# Patient Record
Sex: Male | Born: 1935 | Race: White | Hispanic: No | Marital: Single | Smoking: Former smoker
Health system: Southern US, Community
[De-identification: ages and names within clinical notes are randomized; demographics above are authoritative.]

## PROBLEM LIST (undated history)

## (undated) DIAGNOSIS — G40909 Epilepsy, unspecified, not intractable, without status epilepticus: Secondary | ICD-10-CM

## (undated) DIAGNOSIS — J449 Chronic obstructive pulmonary disease, unspecified: Secondary | ICD-10-CM

## (undated) DIAGNOSIS — E785 Hyperlipidemia, unspecified: Secondary | ICD-10-CM

## (undated) DIAGNOSIS — I442 Atrioventricular block, complete: Secondary | ICD-10-CM

## (undated) DIAGNOSIS — I255 Ischemic cardiomyopathy: Secondary | ICD-10-CM

## (undated) DIAGNOSIS — I10 Essential (primary) hypertension: Secondary | ICD-10-CM

## (undated) DIAGNOSIS — I639 Cerebral infarction, unspecified: Secondary | ICD-10-CM

## (undated) DIAGNOSIS — I4821 Permanent atrial fibrillation: Secondary | ICD-10-CM

## (undated) DIAGNOSIS — I2510 Atherosclerotic heart disease of native coronary artery without angina pectoris: Secondary | ICD-10-CM

## (undated) HISTORY — DX: Atrioventricular block, complete: I44.2

## (undated) HISTORY — DX: Epilepsy, unspecified, not intractable, without status epilepticus: G40.909

## (undated) HISTORY — DX: Chronic obstructive pulmonary disease, unspecified: J44.9

## (undated) HISTORY — DX: Essential (primary) hypertension: I10

## (undated) HISTORY — PX: CHOLECYSTECTOMY: SHX55

## (undated) HISTORY — DX: Permanent atrial fibrillation: I48.21

## (undated) HISTORY — DX: Hyperlipidemia, unspecified: E78.5

## (undated) HISTORY — DX: Atherosclerotic heart disease of native coronary artery without angina pectoris: I25.10

## (undated) HISTORY — DX: Ischemic cardiomyopathy: I25.5

---

## 2002-12-19 ENCOUNTER — Ambulatory Visit (HOSPITAL_COMMUNITY): Admission: RE | Admit: 2002-12-19 | Discharge: 2002-12-19 | Payer: Self-pay | Admitting: Family Medicine

## 2002-12-19 ENCOUNTER — Encounter: Payer: Self-pay | Admitting: Family Medicine

## 2009-05-10 ENCOUNTER — Ambulatory Visit: Payer: Self-pay | Admitting: Cardiology

## 2009-05-10 ENCOUNTER — Inpatient Hospital Stay (HOSPITAL_COMMUNITY): Admission: EM | Admit: 2009-05-10 | Discharge: 2009-05-16 | Payer: Self-pay | Admitting: Emergency Medicine

## 2009-05-14 ENCOUNTER — Encounter (INDEPENDENT_AMBULATORY_CARE_PROVIDER_SITE_OTHER): Payer: Self-pay | Admitting: Neurology

## 2009-05-14 ENCOUNTER — Encounter (INDEPENDENT_AMBULATORY_CARE_PROVIDER_SITE_OTHER): Payer: Self-pay | Admitting: Internal Medicine

## 2009-05-14 ENCOUNTER — Encounter: Payer: Self-pay | Admitting: Cardiology

## 2009-05-14 ENCOUNTER — Ambulatory Visit: Payer: Self-pay | Admitting: Physical Medicine & Rehabilitation

## 2009-05-15 ENCOUNTER — Encounter (INDEPENDENT_AMBULATORY_CARE_PROVIDER_SITE_OTHER): Payer: Self-pay | Admitting: Neurology

## 2009-05-16 ENCOUNTER — Inpatient Hospital Stay (HOSPITAL_COMMUNITY)
Admission: RE | Admit: 2009-05-16 | Discharge: 2009-05-24 | Payer: Self-pay | Admitting: Physical Medicine & Rehabilitation

## 2009-05-16 ENCOUNTER — Ambulatory Visit: Payer: Self-pay | Admitting: Physical Medicine & Rehabilitation

## 2009-05-28 ENCOUNTER — Encounter: Payer: Self-pay | Admitting: Cardiology

## 2009-05-28 LAB — CONVERTED CEMR LAB: Prothrombin Time: 28.7 s

## 2009-05-31 ENCOUNTER — Telehealth: Payer: Self-pay | Admitting: Internal Medicine

## 2009-05-31 ENCOUNTER — Encounter: Payer: Self-pay | Admitting: Cardiology

## 2009-05-31 LAB — CONVERTED CEMR LAB: Prothrombin Time: 65.8 s

## 2009-06-04 ENCOUNTER — Encounter: Payer: Self-pay | Admitting: Cardiology

## 2009-06-04 LAB — CONVERTED CEMR LAB
POC INR: 1.5
Prothrombin Time: 18 s

## 2009-06-07 ENCOUNTER — Encounter: Admission: RE | Admit: 2009-06-07 | Discharge: 2009-06-07 | Payer: Self-pay | Admitting: Family Medicine

## 2009-06-11 ENCOUNTER — Encounter: Payer: Self-pay | Admitting: Cardiology

## 2009-06-11 LAB — CONVERTED CEMR LAB: Prothrombin Time: 18.6 s

## 2009-06-13 ENCOUNTER — Encounter: Payer: Self-pay | Admitting: Internal Medicine

## 2009-06-15 ENCOUNTER — Encounter: Payer: Self-pay | Admitting: Internal Medicine

## 2009-06-18 ENCOUNTER — Encounter: Payer: Self-pay | Admitting: Cardiology

## 2009-06-18 LAB — CONVERTED CEMR LAB: POC INR: 1.5

## 2009-06-25 ENCOUNTER — Encounter: Payer: Self-pay | Admitting: Cardiology

## 2009-06-25 LAB — CONVERTED CEMR LAB: POC INR: 1.9

## 2009-06-28 ENCOUNTER — Encounter
Admission: RE | Admit: 2009-06-28 | Discharge: 2009-07-02 | Payer: Self-pay | Admitting: Physical Medicine & Rehabilitation

## 2009-07-02 ENCOUNTER — Ambulatory Visit: Payer: Self-pay | Admitting: Physical Medicine & Rehabilitation

## 2009-07-02 ENCOUNTER — Encounter: Payer: Self-pay | Admitting: Internal Medicine

## 2009-07-03 DIAGNOSIS — I5023 Acute on chronic systolic (congestive) heart failure: Secondary | ICD-10-CM

## 2009-07-03 DIAGNOSIS — E785 Hyperlipidemia, unspecified: Secondary | ICD-10-CM | POA: Insufficient documentation

## 2009-07-03 DIAGNOSIS — G40909 Epilepsy, unspecified, not intractable, without status epilepticus: Secondary | ICD-10-CM

## 2009-07-03 DIAGNOSIS — I4891 Unspecified atrial fibrillation: Secondary | ICD-10-CM | POA: Insufficient documentation

## 2009-07-03 DIAGNOSIS — J449 Chronic obstructive pulmonary disease, unspecified: Secondary | ICD-10-CM

## 2009-07-03 DIAGNOSIS — I10 Essential (primary) hypertension: Secondary | ICD-10-CM | POA: Insufficient documentation

## 2009-07-04 ENCOUNTER — Ambulatory Visit: Payer: Self-pay | Admitting: Internal Medicine

## 2009-07-04 DIAGNOSIS — I2510 Atherosclerotic heart disease of native coronary artery without angina pectoris: Secondary | ICD-10-CM

## 2009-07-10 ENCOUNTER — Encounter: Payer: Self-pay | Admitting: Cardiovascular Disease

## 2009-07-10 LAB — CONVERTED CEMR LAB
POC INR: 2.7
Prothrombin Time: 32.2 s

## 2009-07-17 ENCOUNTER — Encounter: Payer: Self-pay | Admitting: Cardiology

## 2009-07-18 ENCOUNTER — Encounter: Payer: Self-pay | Admitting: Internal Medicine

## 2009-07-24 ENCOUNTER — Ambulatory Visit: Payer: Self-pay | Admitting: Cardiology

## 2009-08-07 ENCOUNTER — Ambulatory Visit: Payer: Self-pay | Admitting: Internal Medicine

## 2009-08-07 LAB — CONVERTED CEMR LAB: POC INR: 2.1

## 2009-08-15 ENCOUNTER — Encounter: Payer: Self-pay | Admitting: Internal Medicine

## 2009-08-22 ENCOUNTER — Ambulatory Visit: Payer: Self-pay | Admitting: Internal Medicine

## 2009-10-03 ENCOUNTER — Ambulatory Visit: Payer: Self-pay | Admitting: Cardiology

## 2009-10-03 LAB — CONVERTED CEMR LAB: POC INR: 1.6

## 2009-10-24 ENCOUNTER — Ambulatory Visit: Payer: Self-pay | Admitting: Cardiology

## 2009-10-24 LAB — CONVERTED CEMR LAB: POC INR: 1.5

## 2009-11-07 ENCOUNTER — Ambulatory Visit: Payer: Self-pay | Admitting: Cardiology

## 2009-11-21 ENCOUNTER — Ambulatory Visit: Payer: Self-pay | Admitting: Internal Medicine

## 2009-12-03 ENCOUNTER — Ambulatory Visit: Payer: Self-pay | Admitting: Cardiology

## 2009-12-03 LAB — CONVERTED CEMR LAB: POC INR: 3.1

## 2009-12-31 ENCOUNTER — Ambulatory Visit: Payer: Self-pay | Admitting: Cardiology

## 2009-12-31 LAB — CONVERTED CEMR LAB: POC INR: 2

## 2010-01-18 ENCOUNTER — Telehealth (INDEPENDENT_AMBULATORY_CARE_PROVIDER_SITE_OTHER): Payer: Self-pay

## 2010-01-24 ENCOUNTER — Ambulatory Visit: Payer: Self-pay | Admitting: Cardiology

## 2010-02-21 ENCOUNTER — Ambulatory Visit: Payer: Self-pay | Admitting: Cardiology

## 2010-03-21 ENCOUNTER — Ambulatory Visit: Payer: Self-pay | Admitting: Cardiology

## 2010-04-18 ENCOUNTER — Ambulatory Visit: Payer: Self-pay | Admitting: Cardiology

## 2010-04-18 LAB — CONVERTED CEMR LAB: POC INR: 2.2

## 2010-05-10 ENCOUNTER — Encounter: Payer: Self-pay | Admitting: Cardiology

## 2010-05-10 ENCOUNTER — Telehealth: Payer: Self-pay | Admitting: Internal Medicine

## 2010-05-10 ENCOUNTER — Ambulatory Visit: Admission: RE | Admit: 2010-05-10 | Discharge: 2010-05-10 | Payer: Self-pay | Source: Home / Self Care

## 2010-05-16 ENCOUNTER — Ambulatory Visit: Admit: 2010-05-16 | Payer: Self-pay

## 2010-05-17 ENCOUNTER — Ambulatory Visit: Admission: RE | Admit: 2010-05-17 | Discharge: 2010-05-17 | Payer: Self-pay | Source: Home / Self Care

## 2010-05-17 ENCOUNTER — Encounter: Payer: Self-pay | Admitting: Internal Medicine

## 2010-05-17 LAB — CONVERTED CEMR LAB: POC INR: 3

## 2010-06-02 ENCOUNTER — Encounter: Payer: Self-pay | Admitting: Family Medicine

## 2010-06-09 LAB — CONVERTED CEMR LAB
BUN: 14 mg/dL
Basophils Absolute: 0.1 K/uL
Basophils Relative: 0.8 %
CO2: 32 meq/L
Calcium: 9.2 mg/dL
Chloride: 105 meq/L
Creatinine, Ser: 1 mg/dL
Eosinophils Absolute: 0.3 K/uL
Eosinophils Relative: 5 %
GFR calc non Af Amer: 76.55 mL/min
Glucose, Bld: 87 mg/dL
HCT: 40.6 %
Hemoglobin: 13.5 g/dL
Lymphocytes Relative: 24.6 %
Lymphs Abs: 1.6 K/uL
MCHC: 33.2 g/dL
MCV: 90.2 fL
Monocytes Absolute: 0.9 K/uL
Monocytes Relative: 13.4 % — ABNORMAL HIGH
Neutro Abs: 3.8 K/uL
Neutrophils Relative %: 56.2 %
Platelets: 223 K/uL
Potassium: 3.9 meq/L
Pro B Natriuretic peptide (BNP): 455 pg/mL — ABNORMAL HIGH
RBC: 4.5 M/uL
RDW: 14.3 %
Sodium: 141 meq/L
WBC: 6.7 10*3/microliter

## 2010-06-11 NOTE — Letter (Signed)
Summary: [facility]  [facility]   Imported By: Marylou Mccoy 11/15/2009 11:18:43  _____________________________________________________________________  External Attachment:    Type:   Image     Comment:   External Document

## 2010-06-11 NOTE — Medication Information (Signed)
Summary: ccr-lr  Anticoagulant Therapy  Managed by: Vashti Hey, RN Referring MD: Dr Hillis Range PCP: Dr Butch Penny Supervising MD: Diona Browner MD, Remi Deter Indication 1: Atrial Fibrillation (chronic) Lab Used: [facility] [place] Site: [place] INR POC 2.0 INR RANGE 2.0-3.0  Dietary changes: no    Health status changes: no    Bleeding/hemorrhagic complications: no    Recent/future hospitalizations: no    Any changes in medication regimen? no    Recent/future dental: no  Any missed doses?: no       Is patient compliant with meds? yes       Allergies: No Known Drug Allergies  Anticoagulation Management History:      The patient is taking warfarin and comes in today for a routine follow up visit.  Positive risk factors for bleeding include an age of 75 years or older.  The bleeding index is 'intermediate risk'.  Positive CHADS2 values include History of CHF, History of HTN, and Age > 75 years old.  Anticoagulation responsible provider: Diona Browner MD, Remi Deter.  INR POC: 2.0.  Cuvette Lot#: [id number].  Exp: 09/2010.    Anticoagulation Management Assessment/Plan:      The patient's current anticoagulation dose is Warfarin sodium 2.5 mg tabs: Use as directed by Anticoagualtion Clinic.  The target INR is 2.0-3.0.  The next INR is due 04/18/2010.  Anticoagulation instructions were given to patient.  Results were reviewed/authorized by Vashti Hey, RN.  He was notified by Vashti Hey RN.         Prior Anticoagulation Instructions: INR 3.0 Continue coumadin 3.75mg  once daily   Current Anticoagulation Instructions: INR 2.0 Take coumadin 2 tablets tonight then resume 1 1/2 tablets once daily

## 2010-06-11 NOTE — Medication Information (Signed)
Summary: rov/eac  Anticoagulant Therapy  Managed by: Eda Keys, PharmD Referring MD: Dr Hillis Range PCP: Dr Butch Penny Supervising MD: Gala Romney MD, Reuel Boom Indication 1: Atrial Fibrillation (chronic) Lab Used: [facility]  Site: [address] INR POC 2.1 INR RANGE 2.0-3.0  Dietary changes: no    Health status changes: no    Bleeding/hemorrhagic complications: no    Recent/future hospitalizations: no    Any changes in medication regimen? no    Recent/future dental: no  Any missed doses?: no       Is patient compliant with meds? yes       Allergies: No Known Drug Allergies  Anticoagulation Management History:      The patient is taking warfarin and comes in today for a routine follow up visit.  Positive risk factors for bleeding include an age of 75 years or older.  The bleeding index is 'intermediate risk'.  Positive CHADS2 values include History of CHF, History of HTN, and Age > 75 years old.  Anticoagulation responsible provider: Bensimhon MD, Reuel Boom.  INR POC: 2.1.  Cuvette Lot#: [id number].  Exp: 09/2010.    Anticoagulation Management Assessment/Plan:      The patient's current anticoagulation dose is Warfarin sodium 2.5 mg tabs: Use as directed by Anticoagualtion Clinic.  The target INR is 2.0-3.0.  The next INR is due 09/04/2009.  Anticoagulation instructions were given to Ardis Hughs, RN .  Results were reviewed/authorized by Eda Keys, PharmD.  He was notified by Eda Keys.         Prior Anticoagulation Instructions: INR 1.8  Take 1.5 tablets today and tomorrow.  Then return to normal dosing schedule of 1.5 tablets on monday and Friday and 1 tablet all other days.  Return to clinic in 2 weeks.   Current Anticoagulation Instructions: INR 2.1  Continue taking 1.5 tablets on Monday and Friday and take 1 tablet all other days.  Return to clinic in 4 weeks.

## 2010-06-11 NOTE — Miscellaneous (Signed)
Summary: [facility]  [facility]   Imported By: Roderic Ovens 07/10/2009 16:21:31  _____________________________________________________________________  External Attachment:    Type:   Image     Comment:   External Document

## 2010-06-11 NOTE — Assessment & Plan Note (Signed)
Summary: per check out/sf   Visit Type:  Follow-up Primary Provider:  Dr Butch Penny   History of Present Illness: The patient presents today for cardiology followup.  He has a h/o afib, CHF and stroke. The patient denies symptoms of palpitations, chest pain, shortness of breath, orthopnea, PND, lower extremity edema, dizziness, presyncope, syncope, or neurologic sequela.  He remains very active at home.  The patient is tolerating medications without difficulties and is otherwise without complaint today.   Current Medications (verified): 1)  Warfarin Sodium 2.5 Mg Tabs (Warfarin Sodium) .... Use As Directed By Anticoagualtion Clinic 2)  Aspirin 81 Mg Tbec (Aspirin) .... Take One Tablet By Mouth Daily 3)  Carvedilol 6.25 Mg Tabs (Carvedilol) .... Take One-Half  Tablet By Mouth Twice A Day 4)  Furosemide 40 Mg Tabs (Furosemide) .... Take One Tablet By Mouth Daily. 5)  Isosorbide Mononitrate Cr 30 Mg Xr24h-Tab (Isosorbide Mononitrate) .... Take One Tablet By Mouth Daily 6)  Levetiracetam 250 Mg Tabs (Levetiracetam) .... Take One Tablet By Mouth Twice Daily. 7)  Potassium Chloride Crys Cr 20 Meq Cr-Tabs (Potassium Chloride Crys Cr) .... Take One Tablet By Mouth Once Daily 8)  Simvastatin 40 Mg Tabs (Simvastatin) .... Take One Tablet By Mouth Daily At Bedtime  Allergies (verified): No Known Drug Allergies  Past History:  Past Medical History: Reviewed history from 07/04/2009 and no changes required. 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  He has a remote history of tobacco abuse.  He quit 40   years ago.  He smokes cigars for about 10 years.  No alcohol.  He is  married and recently retired  Review of Systems       All systems are reviewed and  negative except as listed in the HPI.   Vital Signs:  Patient profile:   77 year old male Height:      69 inches Weight:      177 pounds BMI:     26.23 Pulse rate:   66 / minute BP sitting:   98 / 64  (left arm)  Vitals Entered By: Laurance Flatten CMA (August 22, 2009 10:07 AM)  Physical Exam  General:  eldelry, NAD Head:  normocephalic and atraumatic Eyes:  PERRLA/EOM intact; conjunctiva and lids normal. Mouth:  Teeth, gums and palate normal. Oral mucosa normal. Neck:  Neck supple, no JVD. No masses, thyromegaly or abnormal cervical nodes. Lungs:  Clear bilaterally to auscultation and percussion. Heart:  RRR, no m/r/g Abdomen:  Bowel sounds positive; abdomen soft and non-tender without masses, organomegaly, or hernias noted. No hepatosplenomegaly. Msk:  Back normal, normal gait. Muscle strength and tone normal. Pulses:  pulses normal in all 4 extremities Extremities:  No clubbing or cyanosis.  trace edema Neurologic:  Alert and oriented x 3.  minimal weakness appears to have made major recovery from stroke Skin:  Intact without lesions or rashes. Psych:  Normal affect.   Impression & Recommendations:  Problem # 1:  ATRIAL FIBRILLATION (ICD-427.31) maintaining sinus continue coumadin for stroke prevention and coreg for rate control  Problem # 2:  CAD (ICD-414.00) The patient has a h/o CAD and ischemic CM.  He has no symptoms of CHF or ischemia presently. On last visit, I recommended that he decrease lasix to 40mg  every other day.  Unfortunatley, he did not do this.  We will decrease lasix today to 40mg  every other day. daily weights and salt restriciton advised consider restarting ace inhibitor if BP will allow  Problem # 3:  HYPERLIPIDEMIA (ICD-272.4) stable His updated medication list for this problem includes:    Simvastatin 40 Mg Tabs (Simvastatin) .Marland Kitchen... Take one tablet by mouth daily at bedtime  Patient Instructions: 1)  Your physician recommends that you schedule a  follow-up appointment in: 3 months with Dr Johney Frame  2)  Your physician has recommended you make the following change in your medication: decrease Furosemide to every other day 3)  You have been referred to [facility] clinic in [place]--pt is set up in [place] but is moving to [place]

## 2010-06-11 NOTE — Medication Information (Signed)
Summary: [facility]  Anticoagulant Therapy  Managed by: Shelby Dubin, PharmD, BCPS, CPP Referring MD: Dr Hillis Range PCP: Dr Butch Penny Supervising MD: Jens Som MD, Arlys John Indication 1: Atrial Fibrillation (chronic) Lab Used: [facility] [place] Site: [address] PT 65.8 INR POC 5.5 INR RANGE 2.0-3.0  Dietary changes: no    Health status changes: no    Bleeding/hemorrhagic complications: no    Recent/future hospitalizations: no    Any changes in medication regimen? no    Recent/future dental: no  Any missed doses?: no       Is patient compliant with meds? yes      Comments: d/w [name] in home (caregiver) and Dennie Bible, RN  Anticoagulation Management History:      His anticoagulation is being managed by telephone today.  Positive risk factors for bleeding include an age of 29 years or older.  The bleeding index is 'intermediate risk'.  Positive CHADS2 values include Age > 53 years old.  Prothrombin time is 65.8.  Anticoagulation responsible provider: Jens Som MD, Arlys John.  INR POC: 5.5.    Anticoagulation Management Assessment/Plan:      The patient's current anticoagulation dose is Warfarin sodium 2.5 mg tabs: Use as directed by Anticoagualtion Clinic.  The target INR is 2.0-3.0.  The next INR is due 06/04/2009.  Anticoagulation instructions were given to patient.  Results were reviewed/authorized by Shelby Dubin, PharmD, BCPS, CPP.  He was notified by Shelby Dubin PharmD, BCPS, CPP.         Prior Anticoagulation Instructions: INR 2.73  Called spoke with pt.  Advised continue on same dosage 2.5mg  daily.  Recheck on Thursday 05/31/09.  Pt denied any bleeding or new problems.  Called [facility] spoke with Sharnette Cobb, advised of dosage and gave verbal order to recheck PT/INR 05/31/09.  Current Anticoagulation Instructions: INR 5.5 Hold 2 days then reduce dose to 0.5 tab daily.   Orders to Judeth Cornfield, RN Blue Team at 1400 to recheck on 06/04/2009.  [name] in home and  Pat,RN aware

## 2010-06-11 NOTE — Miscellaneous (Signed)
Summary: [facility]  [facility]   Imported By: Roderic Ovens 08/21/2009 16:03:19  _____________________________________________________________________  External Attachment:    Type:   Image     Comment:   External Document

## 2010-06-11 NOTE — Medication Information (Signed)
Summary: Coumadin Clinic  Anticoagulant Therapy  Managed by: Cloyde Reams, RN, BSN Referring MD: Dr Hillis Range PCP: Dr Butch Penny Supervising MD: Myrtis Ser MD, Tinnie Gens Indication 1: Atrial Fibrillation (chronic) Lab Used: [facility] [place] Site: [address] PT 45.4 INR POC 3.8 INR RANGE 2.0-3.0  Dietary changes: no    Health status changes: yes       Details: Foley cath removed 10 days ago.  Bleeding/hemorrhagic complications: no    Recent/future hospitalizations: no    Any changes in medication regimen? yes       Details: Completing Septra, last dose today.  Recent/future dental: no  Any missed doses?: no       Is patient compliant with meds? yes      Comments: Septra started on 07/11/09.    Allergies: No Known Drug Allergies  Anticoagulation Management History:      His anticoagulation is being managed by telephone today.  Positive risk factors for bleeding include an age of 65 years or older.  The bleeding index is 'intermediate risk'.  Positive CHADS2 values include History of CHF, History of HTN, and Age > 53 years old.  Prothrombin time is 45.4.  Anticoagulation responsible provider: Myrtis Ser MD, Tinnie Gens.  INR POC: 3.8.    Anticoagulation Management Assessment/Plan:      The patient's current anticoagulation dose is Warfarin sodium 2.5 mg tabs: Use as directed by Anticoagualtion Clinic.  The target INR is 2.0-3.0.  The next INR is due 07/24/2009.  Anticoagulation instructions were given to Ardis Hughs, RN .  Results were reviewed/authorized by Cloyde Reams, RN, BSN.  He was notified by Cloyde Reams RN.         Prior Anticoagulation Instructions: INR 2.7  Called by Ardis Hughs, RN on 3/1 at 1045.  May resume cipro.  Continue 1.5 tabs Monday and Friday / 1 tab daily.    Recheck in 1 week -- orders to Ardis Hughs, RN.    Current Anticoagulation Instructions: INR 3.8  Spoke with Dennie Bible, RN [facility] while at pt's home advised to hold coumadin x 1 dose then resume  same dosage 1 tablet daily except 1.5 tablets on Mondays and Fridays.  Recheck in 1 week.  Made f/u appt in [facility] since [facility] is discharging pt.

## 2010-06-11 NOTE — Medication Information (Signed)
Summary: coumadin rescheduled from 01/30/2010 per daughter/sn  Anticoagulant Therapy  Managed by: Vashti Hey, RN Referring MD: Dr Hillis Range PCP: Dr Butch Penny Supervising MD: Dietrich Pates MD, Molly Maduro Indication 1: Atrial Fibrillation (chronic) Lab Used: [facility] [place] Site: [place] INR POC 2.3 INR RANGE 2.0-3.0  Dietary changes: no    Health status changes: no    Bleeding/hemorrhagic complications: no    Recent/future hospitalizations: no    Any changes in medication regimen? no    Recent/future dental: no  Any missed doses?: no       Is patient compliant with meds? yes       Allergies: No Known Drug Allergies  Anticoagulation Management History:      The patient is taking warfarin and comes in today for a routine follow up visit.  Positive risk factors for bleeding include an age of 75 years or older.  The bleeding index is 'intermediate risk'.  Positive CHADS2 values include History of CHF, History of HTN, and Age > 75 years old.  Anticoagulation responsible Leisa Gault: Dietrich Pates MD, Molly Maduro.  INR POC: 2.3.  Cuvette Lot#: [id number].  Exp: 09/2010.    Anticoagulation Management Assessment/Plan:      The patient's current anticoagulation dose is Warfarin sodium 2.5 mg tabs: Use as directed by Anticoagualtion Clinic.  The target INR is 2.0-3.0.  The next INR is due 02/21/2010.  Anticoagulation instructions were given to patient.  Results were reviewed/authorized by Vashti Hey, RN.  He was notified by Vashti Hey RN.         Prior Anticoagulation Instructions: INR 2.0 Take coumadin 2 tablets tonight then resume 1 1/2 tablets once daily   Current Anticoagulation Instructions: INR 2.3 Continue coumadin 3.75mg  once daily  Prescriptions: WARFARIN SODIUM 2.5 MG TABS (WARFARIN SODIUM) Use as directed by Anticoagualtion Clinic  #45 x 3   Entered by:   Vashti Hey RN   Authorized by:   Hillis Range, MD   Signed by:   Vashti Hey RN on 01/24/2010   Method used:    Electronically to        [facility] (retail)       [address]       [place], [place]  [address]       Ph: [phone]       Fax: [phone]   RxID:   [id number]

## 2010-06-11 NOTE — Medication Information (Signed)
Summary: Coumadin Clinic  Anticoagulant Therapy  Managed by: Shelby Dubin, PharmD, BCPS, CPP Referring MD: Dr Hillis Range PCP: Dr Butch Penny Supervising MD: Clifton James MD, Cristal Deer Indication 1: Atrial Fibrillation (chronic) Lab Used: [facility] [place] Site: [address] PT 32.2 INR POC 2.7 INR RANGE 2.0-3.0  Dietary changes: no    Health status changes: yes       Details: catheter removed  Bleeding/hemorrhagic complications: no    Recent/future hospitalizations: no    Any changes in medication regimen? yes       Details: cipro for 3 days after catheter removal  Recent/future dental: no  Any missed doses?: no       Is patient compliant with meds? yes       Allergies (verified): No Known Drug Allergies  Anticoagulation Management History:      The patient is taking warfarin and comes in today for a routine follow up visit.  Positive risk factors for bleeding include an age of 65 years or older.  The bleeding index is 'intermediate risk'.  Positive CHADS2 values include History of CHF, History of HTN, and Age > 59 years old.  Prothrombin time is 32.2.  Anticoagulation responsible provider: Clifton James MD, Cristal Deer.  INR POC: 2.7.    Anticoagulation Management Assessment/Plan:      The patient's current anticoagulation dose is Warfarin sodium 2.5 mg tabs: Use as directed by Anticoagualtion Clinic.  The target INR is 2.0-3.0.  The next INR is due 07/17/2009.  Anticoagulation instructions were given to Ardis Hughs, RN .  Results were reviewed/authorized by Shelby Dubin, PharmD, BCPS, CPP.  He was notified by Shelby Dubin PharmD, BCPS, CPP.         Prior Anticoagulation Instructions: INR 2.1  Continue current dosing schedule of 1.5 tablets on Monday and Friday and 1 tablet all other days.  Recheck on Tuesday 07/10/09.  Orders given to Ardis Hughs, RN with [facility].  At next INR check we will  need to schedule Mr. Helzer for follow-up in clinic, as he will be d/c from  [facility].  Current Anticoagulation Instructions: INR 2.7  Called by Ardis Hughs, RN on 3/1 at 1045.  May resume cipro.  Continue 1.5 tabs Monday and Friday / 1 tab daily.    Recheck in 1 week -- orders to Ardis Hughs, RN.

## 2010-06-11 NOTE — Medication Information (Signed)
Summary: ccr-lr  Anticoagulant Therapy  Managed by: Vashti Hey, RN Referring MD: Dr Hillis Range PCP: Dr Butch Penny Supervising MD: Diona Browner MD, Remi Deter Indication 1: Atrial Fibrillation (chronic) Lab Used: [facility] [place] Site: [place] INR POC 1.5 INR RANGE 2.0-3.0  Dietary changes: no    Health status changes: no    Bleeding/hemorrhagic complications: no    Recent/future hospitalizations: no    Any changes in medication regimen? no    Recent/future dental: no  Any missed doses?: no       Is patient compliant with meds? yes       Allergies: No Known Drug Allergies  Anticoagulation Management History:      The patient is taking warfarin and comes in today for a routine follow up visit.  Positive risk factors for bleeding include an age of 76 years or older.  The bleeding index is 'intermediate risk'.  Positive CHADS2 values include History of CHF, History of HTN, and Age > 72 years old.  Anticoagulation responsible provider: Diona Browner MD, Remi Deter.  INR POC: 1.5.  Cuvette Lot#: [id number].  Exp: 09/2010.    Anticoagulation Management Assessment/Plan:      The patient's current anticoagulation dose is Warfarin sodium 2.5 mg tabs: Use as directed by Anticoagualtion Clinic.  The target INR is 2.0-3.0.  The next INR is due 11/07/2009.  Anticoagulation instructions were given to patient.  Results were reviewed/authorized by Vashti Hey, RN.  He was notified by Vashti Hey RN.         Prior Anticoagulation Instructions: INR 1.6 Take coumadin 2 tablets tonight then increase dose to 1 1/2 tablets once daily except 1 tablet on Mondays and Thursdays  Current Anticoagulation Instructions: INR 1.5 Take coumadin 2 tablets tonight then increase dose to 1 1/2 tablets once daily except 2 tablets on Mondays and Thursdays

## 2010-06-11 NOTE — Medication Information (Signed)
Summary: Coumadin Clinic  Anticoagulant Therapy  Managed by: Cloyde Reams, RN, BSN Referring MD: Dr Hillis Range PCP: Dr Butch Penny Supervising MD: Antoine Poche MD, Fayrene Fearing Indication 1: Atrial Fibrillation (chronic) Lab Used: [facility] [place] Site: [address] PT 18.0 INR POC 1.5 INR RANGE 2.0-3.0    Bleeding/hemorrhagic complications: no     Any changes in medication regimen? yes       Details: Started on Augmentin on Thurs 875mg  bid x 7 days.     Any missed doses?: no         Anticoagulation Management History:      His anticoagulation is being managed by telephone today.  Positive risk factors for bleeding include an age of 74 years or older.  The bleeding index is 'intermediate risk'.  Positive CHADS2 values include Age > 19 years old.  Prothrombin time is 18.0.  Anticoagulation responsible provider: Antoine Poche MD, Fayrene Fearing.  INR POC: 1.5.    Anticoagulation Management Assessment/Plan:      The patient's current anticoagulation dose is Warfarin sodium 2.5 mg tabs: Use as directed by Anticoagualtion Clinic.  The target INR is 2.0-3.0.  The next INR is due 06/11/2009.  Anticoagulation instructions were given to [facility], RN.  Results were reviewed/authorized by Cloyde Reams, RN, BSN.  He was notified by Bethena Midget, RN, PRN.         Prior Anticoagulation Instructions: INR 5.5 Hold 2 days then reduce dose to 0.5 tab daily.   Orders to Judeth Cornfield, RN Blue Team at 1400 to recheck on 06/04/2009.  [name] in home and Pat,RN aware  Current Anticoagulation Instructions: INR 1.5  Spoke with Dennie Bible, [facility] RN while at pt's home.  Advised to start taking 1/2 tablet daily except 1 tablet on Mondays, Wednesdays, and Fridays.  Recheck in 1 week.

## 2010-06-11 NOTE — Assessment & Plan Note (Signed)
Summary: nep/per dc summary/jss   Visit Type:  Initial Consult Primary Provider:  Dr Butch Penny   History of Present Illness: The patient presents today for cardiology followup after his recent hospitalization for CHF and stroke. The patient denies symptoms of palpitations, chest pain, shortness of breath, orthopnea, PND, lower extremity edema, dizziness, presyncope, syncope, or neurologic sequela.  He was seen by his PCP recently and noted to be hypotensive.  Enalapril was discontinued at that time (per pt).  The patient is tolerating medications without difficulties and is otherwise without complaint today.   Current Medications (verified): 1)  Warfarin Sodium 2.5 Mg Tabs (Warfarin Sodium) .... Use As Directed By Anticoagualtion Clinic 2)  Aspirin 81 Mg Tbec (Aspirin) .... Take One Tablet By Mouth Daily 3)  Carvedilol 6.25 Mg Tabs (Carvedilol) .... Take One-Half  Tablet By Mouth Twice A Day 4)  Furosemide 40 Mg Tabs (Furosemide) .... Take One Tablet By Mouth Daily. 5)  Isosorbide Mononitrate Cr 30 Mg Xr24h-Tab (Isosorbide Mononitrate) .... Take One Tablet By Mouth Daily 6)  Levetiracetam 250 Mg Tabs (Levetiracetam) .... Take One Tablet By Mouth Twice Daily. 7)  Potassium Chloride Crys Cr 20 Meq Cr-Tabs (Potassium Chloride Crys Cr) .... Take One Tablet By Mouth Twice A Day 8)  Simvastatin 40 Mg Tabs (Simvastatin) .... 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  He quit 40   years ago.  He smokes cigars for about 10 years.  No alcohol.  He is  married and recently retired  Review of Systems       All systems are reviewed and negative  except as listed in the HPI.   Vital Signs:  Patient profile:   77 year old male Height:      69 inches Weight:      179 pounds BMI:     26.53 Pulse rate:   63 / minute BP sitting:   96 / 60  (left arm)  Vitals Entered By: Laurance Flatten CMA (July 04, 2009 10:23 AM)  Physical Exam  General:  eldelry, NAD Head:  normocephalic and atraumatic Eyes:  PERRLA/EOM intact; conjunctiva and lids normal. Mouth:  Teeth, gums and palate normal. Oral mucosa normal. Neck:  Neck supple, no JVD. No masses, thyromegaly or abnormal cervical nodes. Lungs:  Clear bilaterally to auscultation and percussion. Heart:  RRR, no m/r/g Abdomen:  Bowel sounds positive; abdomen soft and non-tender without masses, organomegaly, or hernias noted. No hepatosplenomegaly. Msk:  Back normal, normal gait. Muscle strength and tone normal. Pulses:  pulses normal in all 4 extremities Extremities:  No clubbing or cyanosis.  trace edema Neurologic:  Alert and oriented x 3.  minimal weakness appears to have made major recovery from stroke Skin:  Intact without lesions or rashes. Cervical Nodes:  no significant adenopathy Psych:  Normal affect.   EKG  Procedure date:  07/04/2009  Findings:      sus rhythm 63 bpm, PR 250, incomplete LBBB  Impression & Recommendations:  Problem # 1:  ATRIAL FIBRILLATION (ICD-427.31) stable, presently in sinus rhythm. continue coumadin with close INR follow-ups advised We will check CBC as he has recently started coumadin   His updated medication list for this problem  includes:    Warfarin Sodium 2.5 Mg Tabs (Warfarin sodium) ..... Use as directed by anticoagualtion clinic    Aspirin 81 Mg Tbec (Aspirin) .Marland Kitchen... Take one tablet by mouth daily    Carvedilol 6.25 Mg Tabs (Carvedilol) .Marland Kitchen... Take one-half  tablet by mouth twice a day  Orders: TLB-BMP (Basic Metabolic Panel-BMET) (80048-METABOL) TLB-BNP (B-Natriuretic Peptide) (83880-BNPR) TLB-CBC Platelet - w/Differential  (85025-CBCD)  Problem # 2:  CHF (ICD-428.0) The patient has ischemic CM with chronic systolic dysfunction and EF 40-50% by echo. Salt restriction and daily weights advised we will decrease lasix to 40mg  every other day today.  As his BP improves, hopefully, we can restart ACE inhibitor. Decrease potassium to daily today Check BMET and BNP today  His updated medication list for this problem includes:    Warfarin Sodium 2.5 Mg Tabs (Warfarin sodium) ..... Use as directed by anticoagualtion clinic    Aspirin 81 Mg Tbec (Aspirin) .Marland Kitchen... Take one tablet by mouth daily    Carvedilol 6.25 Mg Tabs (Carvedilol) .Marland Kitchen... Take one-half  tablet by mouth twice a day    Furosemide 40 Mg Tabs (Furosemide) .Marland Kitchen... Take one tablet by mouth daily.    Isosorbide Mononitrate Cr 30 Mg Xr24h-tab (Isosorbide mononitrate) .Marland Kitchen... Take one tablet by mouth daily  Problem # 3:  CAD (ICD-414.00) I have reviewed Dr Louretta Shorten recent cath from 1/11 which reveals significant CAD.  Medical therapy was recommended. The patient has done very well with medical therapy. He has no ischemic symptoms presently.   His updated medication list for this problem includes:    Warfarin Sodium 2.5 Mg Tabs (Warfarin sodium) ..... Use as directed by anticoagualtion clinic    Aspirin 81 Mg Tbec (Aspirin) .Marland Kitchen... Take one tablet by mouth daily    Carvedilol 6.25 Mg Tabs (Carvedilol) .Marland Kitchen... Take one-half  tablet by mouth twice a day    Isosorbide Mononitrate Cr 30 Mg Xr24h-tab (Isosorbide mononitrate) .Marland Kitchen... Take one tablet by mouth daily  Problem # 4:  HYPERTENSION (ICD-401.9) stable as above His updated medication list for this problem includes:    Aspirin 81 Mg Tbec (Aspirin) .Marland Kitchen... Take one tablet by mouth daily    Carvedilol 6.25 Mg Tabs (Carvedilol) .Marland Kitchen... Take one-half  tablet by mouth twice a day    Furosemide 40 Mg Tabs (Furosemide) .Marland Kitchen... Take one tablet by mouth daily.  Problem # 5:  HYPERLIPIDEMIA (ICD-272.4) stable His updated  medication list for this problem includes:    Simvastatin 40 Mg Tabs (Simvastatin) .Marland Kitchen... Take one tablet by mouth daily at bedtime  Other Orders: EKG w/ Interpretation (93000)  Patient Instructions: 1)  Your physician has requested that you limit the intake of sodium (salt) in your diet to two grams daily. Please see [facility] handout. 2)  Your physician recommends that you Have labs today:  BMET, CBC, BNP 3)  Your physician recommends that you schedule a follow-up appointment in: 6 WEEKS

## 2010-06-11 NOTE — Miscellaneous (Signed)
Summary: [facility]  [facility]   Imported By: Roderic Ovens 07/26/2009 11:51:03  _____________________________________________________________________  External Attachment:    Type:   Image     Comment:   External Document

## 2010-06-11 NOTE — Miscellaneous (Signed)
Summary: [facility]  [facility]   Imported By: Roderic Ovens 08/14/2009 11:27:56  _____________________________________________________________________  External Attachment:    Type:   Image     Comment:   External Document

## 2010-06-11 NOTE — Medication Information (Signed)
Summary: rov/ewj  Anticoagulant Therapy  Managed by: Eda Keys, PharmD Referring MD: Dr Hillis Range PCP: Dr Butch Penny Supervising MD: Myrtis Ser MD, Tinnie Gens Indication 1: Atrial Fibrillation (chronic) Lab Used: [facility] [place] Site: [address] INR POC 1.8 INR RANGE 2.0-3.0  Dietary changes: no    Health status changes: no    Bleeding/hemorrhagic complications: no    Recent/future hospitalizations: no    Any changes in medication regimen? no    Recent/future dental: no  Any missed doses?: yes     Details: pt missed one dose last night  Is patient compliant with meds? yes       Allergies: No Known Drug Allergies  Anticoagulation Management History:      The patient is taking warfarin and comes in today for a routine follow up visit.  Positive risk factors for bleeding include an age of 75 years or older.  The bleeding index is 'intermediate risk'.  Positive CHADS2 values include History of CHF, History of HTN, and Age > 75 years old.  Anticoagulation responsible provider: Myrtis Ser MD, Tinnie Gens.  INR POC: 1.8.  Cuvette Lot#: [id number].  Exp: 09/2010.    Anticoagulation Management Assessment/Plan:      The patient's current anticoagulation dose is Warfarin sodium 2.5 mg tabs: Use as directed by Anticoagualtion Clinic.  The target INR is 2.0-3.0.  The next INR is due 08/07/2009.  Anticoagulation instructions were given to Ardis Hughs, RN .  Results were reviewed/authorized by Eda Keys, PharmD.  He was notified by Eda Keys.         Prior Anticoagulation Instructions: INR 3.8  Spoke with Dennie Bible, RN [facility] while at pt's home advised to hold coumadin x 1 dose then resume same dosage 1 tablet daily except 1.5 tablets on Mondays and Fridays.  Recheck in 1 week.  Made f/u appt in [facility] since [facility] is discharging pt.    Current Anticoagulation Instructions: INR 1.8  Take 1.5 tablets today and tomorrow.  Then return to normal dosing schedule of 1.5 tablets on  monday and Friday and 1 tablet all other days.  Return to clinic in 2 weeks.

## 2010-06-11 NOTE — Progress Notes (Signed)
Summary: rx needed today will be out sunday  Phone Note Call from Patient Call back at Home Phone [phone]   Caller: pt Reason for Call: Refill Medication Summary of Call: pt will be out of coumadin on sunday 01/20/10 because of increase in dose the pharmacy will not fill untill 01/26/10. please [facility] here in [place]. Initial call taken by: Tanya Jackson,  January 18, 2010 11:17 AM    Prescriptions: WARFARIN SODIUM 2.5 MG TABS (WARFARIN SODIUM) Use as directed by Anticoagualtion Clinic  #15 x 0   Entered by:   Lynn Via LPN   Authorized by:   James Allred, MD   Signed by:   Lynn Via LPN on 01/18/2010   Method used:   Electronically to        [facility]  [address] (retail)       [address]       [place], [place]  [address]       Ph: [phone]       Fax: [phone]   RxID:   [id number]

## 2010-06-11 NOTE — Progress Notes (Signed)
Summary: INR RESULTS  Phone Note Other Incoming Call back at Home Phone [phone] Call back at [phone]   Caller: [facility] Summary of Call: LABS ARE 65.8 PROTIME AND 5.5 INR NEED A CALL RIGHT AWAY. Initial call taken by: Judie Grieve,  May 31, 2009 1:33 PM  Follow-up for Phone Call        Called and d/w [name] in home at 1357.    Called to 2nd number Dennie Bible, RN) -- [phone]  She asked that I call orders to [facility] main office.   Follow-up by: Shelby Dubin PharmD, BCPS, CPP,  May 31, 2009 2:01 PM

## 2010-06-11 NOTE — Medication Information (Signed)
Summary: Coumadin Clinic  Anticoagulant Therapy  Managed by: Cloyde Reams, RN, BSN Referring MD: Dr Hillis Range PCP: Dr Butch Penny Supervising MD: Shirlee Latch MD, Freida Busman Indication 1: Atrial Fibrillation (chronic) Lab Used: [facility] [place] Site: [address] PT 18.6 INR POC 1.5 INR RANGE 2.0-3.0    Bleeding/hemorrhagic complications: no     Any changes in medication regimen? yes       Details: Started on Clindamycin 300mg  tid x 10 days on 06/06/09.     Any missed doses?: no        Comments: Pt's son does medications for pt.  His # is [phone].  Anticoagulation Management History:      His anticoagulation is being managed by telephone today.  Positive risk factors for bleeding include an age of 75 years or older.  The bleeding index is 'intermediate risk'.  Positive CHADS2 values include Age > 23 years old.  Prothrombin time is 18.6.  Anticoagulation responsible provider: Shirlee Latch MD, Sunita Demond.  INR POC: 1.5.    Anticoagulation Management Assessment/Plan:      The patient's current anticoagulation dose is Warfarin sodium 2.5 mg tabs: Use as directed by Anticoagualtion Clinic.  The target INR is 2.0-3.0.  The next INR is due 06/18/2009.  Anticoagulation instructions were given to Patient's son.  Results were reviewed/authorized by Cloyde Reams, RN, BSN.  He was notified by Cloyde Reams RN.         Prior Anticoagulation Instructions: INR 1.5  Spoke with Dennie Bible, [facility] RN while at pt's home.  Advised to start taking 1/2 tablet daily except 1 tablet on Mondays, Wednesdays, and Fridays.  Recheck in 1 week.    Current Anticoagulation Instructions: INR 1.5  Called spoke with pt's son advised to incr dosage 2.5mg  daily except 1.25mg  on Sundays and Thursdays.  Recheck in 1 week.    Appended Document: Coumadin Clinic Orders to Ardis Hughs, RN at 1500 (cell [phone]).Marland Kitchenmep

## 2010-06-11 NOTE — Assessment & Plan Note (Signed)
Summary: per check out/sf   Visit Type:  Follow-up Primary Provider:  Dr Butch Penny   History of Present Illness: The patient presents today for cardiology followup.  He has a h/o afib, CHF and stroke. The patient denies symptoms of palpitations, chest pain, shortness of breath, orthopnea, PND, lower extremity edema, dizziness, presyncope, syncope, or neurologic sequela.  He remains very active at home.  The patient is tolerating medications without difficulties and is otherwise without complaint today.   Current Medications (verified): 1)  Warfarin Sodium 2.5 Mg Tabs (Warfarin Sodium) .... Use As Directed By Anticoagualtion Clinic 2)  Aspirin 81 Mg Tbec (Aspirin) .... Take One Tablet By Mouth Daily 3)  Carvedilol 6.25 Mg Tabs (Carvedilol) .... Take One-Half  Tablet By Mouth Twice A Day 4)  Furosemide 40 Mg Tabs (Furosemide) .... Take One Tablet By Mouth Every Other Day . 5)  Isosorbide Mononitrate Cr 30 Mg Xr24h-Tab (Isosorbide Mononitrate) .... Take One Tablet By Mouth Daily 6)  Levetiracetam 250 Mg Tabs (Levetiracetam) .... Take One Tablet By Mouth Twice Daily. 7)  Potassium Chloride Crys Cr 20 Meq Cr-Tabs (Potassium Chloride Crys Cr) .... Take One Tablet By Mouth Once Daily 8)  Simvastatin 40 Mg Tabs (Simvastatin) .... Take One Tablet By Mouth Daily At Bedtime  Allergies (verified): No Known Drug Allergies  Past History:  Past Medical History: Reviewed history from 07/04/2009 and no changes required. 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  He has a remote history of tobacco abuse.  He quit 40   years ago.  He smokes cigars for about 10 years.  No alcohol.  He is  married and recently retired  Review of Systems       All systems are  reviewed and negative except as listed in the HPI.   Vital Signs:  Patient profile:   77 year old male Height:      69 inches Weight:      177 pounds BMI:     26.23 Pulse rate:   57 / minute BP sitting:   102 / 70  (left arm)  Vitals Entered By: Laurance Flatten CMA (November 21, 2009 10:01 AM)  Physical Exam  General:  eldelry, NAD Head:  normocephalic and atraumatic Eyes:  PERRLA/EOM intact; conjunctiva and lids normal. Mouth:  Teeth, gums and palate normal. Oral mucosa normal. Neck:  Neck supple, no JVD. No masses, thyromegaly or abnormal cervical nodes. Lungs:  Clear bilaterally to auscultation and percussion. Heart:  RRR, no m/r/g Abdomen:  Bowel sounds positive; abdomen soft and non-tender without masses, organomegaly, or hernias noted. No hepatosplenomegaly. Msk:  Back normal, normal gait. Muscle strength and tone normal. Pulses:  pulses normal in all 4 extremities Extremities:  No clubbing or cyanosis.  trace edema Neurologic:  Alert and oriented x 3.  Skin:  Intact without lesions or rashes.   EKG  Procedure date:  11/21/2009  Findings:      sinus bradycardia 57 bpm, PR 246, anteroseptal infarction, LAD  Impression & Recommendations:  Problem # 1:  ATRIAL FIBRILLATION (ICD-427.31) maintaining sinus continue coumadin for stroke prevention and coreg for rate control  Problem # 2:  CHF (ICD-428.0) The patient has chronic systolic dysfunction but appears euvolemic on exam.  His BP is low today and I am concerned that he is dehydrated at times.  We will decrease lasix to 20mg  every other day.  Salt restriction and daily weights.  Problem # 3:  CAD (ICD-414.00) continue asa and coreg no changes today  Problem # 4:  HYPERLIPIDEMIA (ICD-272.4) stable His updated medication list for this problem includes:    Simvastatin 40 Mg Tabs (Simvastatin) .Marland Kitchen... Take one tablet by mouth daily at bedtime  Patient Instructions: 1)  Your physician recommends that you schedule a  follow-up appointment in: 6 months with Dr Johney Frame 2)  Your physician has recommended you make the following change in your medication: decrease Lasix to 20mg  every other day(1/2 40mg  tablet)

## 2010-06-11 NOTE — Medication Information (Signed)
Summary: ccr-lr  Anticoagulant Therapy  Managed by: Vashti Hey, RN Referring MD: Dr Hillis Range PCP: Dr Butch Penny Supervising MD: Dietrich Pates MD, Molly Maduro Indication 1: Atrial Fibrillation (chronic) Lab Used: [facility] [place] Site: [place] INR POC 3.6 INR RANGE 2.0-3.0  Dietary changes: no    Health status changes: no    Bleeding/hemorrhagic complications: no    Recent/future hospitalizations: no    Any changes in medication regimen? no    Recent/future dental: no  Any missed doses?: no       Is patient compliant with meds? yes       Allergies: No Known Drug Allergies  Anticoagulation Management History:      The patient is taking warfarin and comes in today for a routine follow up visit.  Positive risk factors for bleeding include an age of 9 years or older.  The bleeding index is 'intermediate risk'.  Positive CHADS2 values include History of CHF, History of HTN, and Age > 37 years old.  Anticoagulation responsible provider: Dietrich Pates MD, Molly Maduro.  INR POC: 3.6.  Cuvette Lot#: [id number].  Exp: 09/2010.    Anticoagulation Management Assessment/Plan:      The patient's current anticoagulation dose is Warfarin sodium 2.5 mg tabs: Use as directed by Anticoagualtion Clinic.  The target INR is 2.0-3.0.  The next INR is due 12/03/2009.  Anticoagulation instructions were given to patient.  Results were reviewed/authorized by Vashti Hey, RN.  He was notified by Vashti Hey RN.         Prior Anticoagulation Instructions: INR 1.5 Take coumadin 2 tablets tonight then increase dose to 1 1/2 tablets once daily except 2 tablets on Mondays and Thursdays  Current Anticoagulation Instructions: INR 3.6 Hold coumadin tonight then decrease dose to 7.5mg  once daily

## 2010-06-11 NOTE — Medication Information (Signed)
Summary: ccr-lr  Anticoagulant Therapy  Managed by: Vashti Hey, RN Referring MD: Dr Hillis Range PCP: Dr Butch Penny Supervising MD: Daleen Squibb MD, Maisie Fus Indication 1: Atrial Fibrillation (chronic) Lab Used: [facility] [place] Site: [place] INR POC 2.2 INR RANGE 2.0-3.0  Dietary changes: no    Health status changes: no    Bleeding/hemorrhagic complications: no    Recent/future hospitalizations: no    Any changes in medication regimen? no    Recent/future dental: no  Any missed doses?: no       Is patient compliant with meds? yes       Allergies: No Known Drug Allergies  Anticoagulation Management History:      The patient is taking warfarin and comes in today for a routine follow up visit.  Positive risk factors for bleeding include an age of 75 years or older.  The bleeding index is 'intermediate risk'.  Positive CHADS2 values include History of CHF, History of HTN, and Age > 50 years old.  Anticoagulation responsible provider: Daleen Squibb MD, Maisie Fus.  INR POC: 2.2.  Cuvette Lot#: [id number].  Exp: 09/2010.    Anticoagulation Management Assessment/Plan:      The patient's current anticoagulation dose is Warfarin sodium 2.5 mg tabs: Use as directed by Anticoagualtion Clinic.  The target INR is 2.0-3.0.  The next INR is due 05/16/2010.  Anticoagulation instructions were given to patient.  Results were reviewed/authorized by Vashti Hey, RN.  He was notified by Vashti Hey RN.         Prior Anticoagulation Instructions: INR 2.0 Take coumadin 2 tablets tonight then resume 1 1/2 tablets once daily   Current Anticoagulation Instructions: INR 2.2 Continue coumadin 3.75mg  once daily

## 2010-06-11 NOTE — Medication Information (Signed)
Summary: ccr-lr  Anticoagulant Therapy  Managed by: Vashti Hey, RN Referring MD: Dr Hillis Range PCP: Dr Butch Penny Supervising MD: Dietrich Pates MD, Molly Maduro Indication 1: Atrial Fibrillation (chronic) Lab Used: [facility] [place] Site: [place] INR POC 3.1 INR RANGE 2.0-3.0  Dietary changes: no    Health status changes: no    Bleeding/hemorrhagic complications: no    Recent/future hospitalizations: no    Any changes in medication regimen? no    Recent/future dental: no  Any missed doses?: no       Is patient compliant with meds? yes       Allergies: No Known Drug Allergies  Anticoagulation Management History:      The patient is taking warfarin and comes in today for a routine follow up visit.  Positive risk factors for bleeding include an age of 44 years or older.  The bleeding index is 'intermediate risk'.  Positive CHADS2 values include History of CHF, History of HTN, and Age > 78 years old.  Anticoagulation responsible provider: Dietrich Pates MD, Molly Maduro.  INR POC: 3.1.  Cuvette Lot#: [id number].  Exp: 09/2010.    Anticoagulation Management Assessment/Plan:      The patient's current anticoagulation dose is Warfarin sodium 2.5 mg tabs: Use as directed by Anticoagualtion Clinic.  The target INR is 2.0-3.0.  The next INR is due 12/31/2009.  Anticoagulation instructions were given to patient.  Results were reviewed/authorized by Vashti Hey, RN.  He was notified by Vashti Hey RN.         Prior Anticoagulation Instructions: INR 3.6 Hold coumadin tonight then decrease dose to 7.5mg  once daily   Current Anticoagulation Instructions: INR 3.1 Take coumadin 1 tablet tonight then resume 1 1/2 tablets once daily

## 2010-06-11 NOTE — Medication Information (Signed)
Summary: Coumadin Clinic  Anticoagulant Therapy  Managed by: Shelby Dubin, PharmD, BCPS, CPP Referring MD: Dr Hillis Range PCP: Dr Butch Penny Supervising MD: Shirlee Latch MD, Freida Busman Indication 1: Atrial Fibrillation (chronic) Lab Used: [facility] [place] Site: [address] PT 22.7 INR POC 1.9 INR RANGE 2.0-3.0  Dietary changes: no    Health status changes: no    Bleeding/hemorrhagic complications: no    Recent/future hospitalizations: no    Any changes in medication regimen? no    Recent/future dental: no  Any missed doses?: no       Is patient compliant with meds? yes       Allergies (verified): No Known Drug Allergies  Anticoagulation Management History:      His anticoagulation is being managed by telephone today.  Positive risk factors for bleeding include an age of 75 years or older.  The bleeding index is 'intermediate risk'.  Positive CHADS2 values include Age > 75 years old.  Prothrombin time is 22.7.  Anticoagulation responsible provider: Shirlee Latch MD, Carine Nordgren.  INR POC: 1.9.    Anticoagulation Management Assessment/Plan:      The patient's current anticoagulation dose is Warfarin sodium 2.5 mg tabs: Use as directed by Anticoagualtion Clinic.  The target INR is 2.0-3.0.  The next INR is due 07/02/2009.  Anticoagulation instructions were given to Ardis Hughs, RN .  Results were reviewed/authorized by Shelby Dubin, PharmD, BCPS, CPP.  He was notified by Ardis Hughs, RN .         Prior Anticoagulation Instructions: Called by Dennie Bible, RN at [facility] at [phone]..06/18/2009  Take 1.5 tabs today then increase to 1 tab daily.    Recheck in 1 week.    Current Anticoagulation Instructions: INR 1.9  Increase to 1.5 tabs each Monday and Friday and 1 tab on all other days.  Recheck in 1 week.

## 2010-06-11 NOTE — Medication Information (Signed)
Summary: [facility]  Anticoagulant Therapy  Managed by: Cloyde Reams, RN, BSN Referring MD: Dr Hillis Range PCP: Dr Butch Penny Supervising MD: Jens Som MD, Arlys John Indication 1: Atrial Fibrillation (chronic) Lab Used: [facility] [place] Site: [address] INR POC 1.5 INR RANGE 2.0-3.0  Dietary changes: no    Health status changes: no    Bleeding/hemorrhagic complications: no    Recent/future hospitalizations: no    Any changes in medication regimen? no    Recent/future dental: no  Any missed doses?: no       Is patient compliant with meds? yes       Current Medications (verified): 1)  Warfarin Sodium 2.5 Mg Tabs (Warfarin Sodium) .... Use As Directed By Anticoagualtion Clinic  Anticoagulation Management History:      His anticoagulation is being managed by telephone today.  Positive risk factors for bleeding include an age of 75 years or older.  The bleeding index is 'intermediate risk'.  Positive CHADS2 values include Age > 75 years old.  Anticoagulation responsible provider: Jens Som MD, Arlys John.  INR POC: 1.5.    Anticoagulation Management Assessment/Plan:      The patient's current anticoagulation dose is Warfarin sodium 2.5 mg tabs: Use as directed by Anticoagualtion Clinic.  The target INR is 2.0-3.0.  The next INR is due 06/25/2009.  Anticoagulation instructions were given to Ardis Hughs, RN .  Results were reviewed/authorized by Cloyde Reams, RN, BSN.  He was notified by Shelby Dubin PharmD, BCPS, CPP.         Prior Anticoagulation Instructions: INR 1.5  Called spoke with pt's son advised to incr dosage 2.5mg  daily except 1.25mg  on Sundays and Thursdays.  Recheck in 1 week.    Current Anticoagulation Instructions: Called by Dennie Bible, RN at [facility] at [phone]..06/18/2009  Take 1.5 tabs today then increase to 1 tab daily.    Recheck in 1 week.

## 2010-06-11 NOTE — Letter (Signed)
Summary: Handout Printed  Printed Handout:  - Coumadin Instructions-w/out Meds 

## 2010-06-11 NOTE — Medication Information (Signed)
Summary: ccr-lr  Anticoagulant Therapy  Managed by: Vashti Hey, RN Referring MD: Dr Hillis Range PCP: Dr Butch Penny Supervising MD: Daleen Squibb MD, Maisie Fus Indication 1: Atrial Fibrillation (chronic) Lab Used: [facility] [place] Site: [name] INR POC 2.0 INR RANGE 2.0-3.0  Dietary changes: no    Health status changes: no    Bleeding/hemorrhagic complications: no    Recent/future hospitalizations: no    Any changes in medication regimen? no    Recent/future dental: no  Any missed doses?: no       Is patient compliant with meds? yes       Allergies: No Known Drug Allergies  Anticoagulation Management History:      The patient is taking warfarin and comes in today for a routine follow up visit.  Positive risk factors for bleeding include an age of 75 years or older.  The bleeding index is 'intermediate risk'.  Positive CHADS2 values include History of CHF, History of HTN, and Age > 14 years old.  Anticoagulation responsible provider: Daleen Squibb MD, Maisie Fus.  INR POC: 2.0.  Cuvette Lot#: [id number].  Exp: 09/2010.    Anticoagulation Management Assessment/Plan:      The patient's current anticoagulation dose is Warfarin sodium 2.5 mg tabs: Use as directed by Anticoagualtion Clinic.  The target INR is 2.0-3.0.  The next INR is due 01/30/2010.  Anticoagulation instructions were given to patient.  Results were reviewed/authorized by Vashti Hey, RN.  He was notified by Vashti Hey RN.         Prior Anticoagulation Instructions: INR 3.1 Take coumadin 1 tablet tonight then resume 1 1/2 tablets once daily   Current Anticoagulation Instructions: INR 2.0 Take coumadin 2 tablets tonight then resume 1 1/2 tablets once daily

## 2010-06-11 NOTE — Medication Information (Signed)
Summary: Coumadin Clinic  Anticoagulant Therapy  Managed by: Cloyde Reams, RN, BSN Referring MD: Dr Hillis Range PCP: Dr Butch Penny Supervising MD: Jens Som MD, Arlys John Indication 1: Atrial Fibrillation (chronic) Lab Used: [facility] [place] Site: [address] PT 28.7 INR POC 2.73 INR RANGE 2.0-3.0    Bleeding/hemorrhagic complications: no     Any changes in medication regimen? yes       Details: See med list.   Any missed doses?: no       Is patient compliant with meds? yes       Anticoagulation Management History:      His anticoagulation is being managed by telephone today.  Positive risk factors for bleeding include an age of 75 years or older.  The bleeding index is 'intermediate risk'.  Positive CHADS2 values include Age > 32 years old.  Prothrombin time is 28.7.  Anticoagulation responsible provider: Jens Som MD, Arlys John.  INR POC: 2.73.    Anticoagulation Management Assessment/Plan:      The patient's current anticoagulation dose is Warfarin sodium 2.5 mg tabs: Use as directed by Anticoagualtion Clinic.  The target INR is 2.0-3.0.  The next INR is due 05/31/2009.  Anticoagulation instructions were given to patient.  Results were reviewed/authorized by Cloyde Reams, RN, BSN.  He was notified by Cloyde Reams RN.         Current Anticoagulation Instructions: INR 2.73  Called spoke with pt.  Advised continue on same dosage 2.5mg  daily.  Recheck on Thursday 05/31/09.  Pt denied any bleeding or new problems.  Called [facility] spoke with Sharnette Cobb, advised of dosage and gave verbal order to recheck PT/INR 05/31/09.

## 2010-06-11 NOTE — Medication Information (Signed)
Summary: rov/sp  Anticoagulant Therapy  Managed by: Vashti Hey, RN Referring MD: Dr Hillis Range PCP: Dr Butch Penny Supervising MD: Diona Browner MD, Remi Deter Indication 1: Atrial Fibrillation (chronic) Lab Used: [facility] [place] Site: [place] INR POC 1.6 INR RANGE 2.0-3.0  Dietary changes: no    Health status changes: no    Bleeding/hemorrhagic complications: no    Recent/future hospitalizations: no    Any changes in medication regimen? no    Recent/future dental: no  Any missed doses?: no       Is patient compliant with meds? yes       Allergies: No Known Drug Allergies  Anticoagulation Management History:      The patient is taking warfarin and comes in today for a routine follow up visit.  Positive risk factors for bleeding include an age of 75 years or older.  The bleeding index is 'intermediate risk'.  Positive CHADS2 values include History of CHF, History of HTN, and Age > 58 years old.  Anticoagulation responsible provider: Diona Browner MD, Remi Deter.  INR POC: 1.6.  Cuvette Lot#: [id number].  Exp: 09/2010.    Anticoagulation Management Assessment/Plan:      The patient's current anticoagulation dose is Warfarin sodium 2.5 mg tabs: Use as directed by Anticoagualtion Clinic.  The target INR is 2.0-3.0.  The next INR is due 10/24/2009.  Anticoagulation instructions were given to patient.  Results were reviewed/authorized by Vashti Hey, RN.  He was notified by Vashti Hey RN.         Prior Anticoagulation Instructions: INR 2.1  Continue taking 1.5 tablets on Monday and Friday and take 1 tablet all other days.  Return to clinic in 4 weeks.    Current Anticoagulation Instructions: INR 1.6 Take coumadin 2 tablets tonight then increase dose to 1 1/2 tablets once daily except 1 tablet on Mondays and Thursdays

## 2010-06-11 NOTE — Medication Information (Signed)
Summary: ccr-lr  Anticoagulant Therapy  Managed by: Vashti Hey, RN Referring MD: Dr Hillis Range PCP: Dr Butch Penny Supervising MD: Dietrich Pates MD, Molly Maduro Indication 1: Atrial Fibrillation (chronic) Lab Used: [facility] [place] Site: [place] INR POC 3.0 INR RANGE 2.0-3.0  Dietary changes: no    Health status changes: no    Bleeding/hemorrhagic complications: no    Recent/future hospitalizations: no    Any changes in medication regimen? no    Recent/future dental: no  Any missed doses?: no       Is patient compliant with meds? yes       Allergies: No Known Drug Allergies  Anticoagulation Management History:      The patient is taking warfarin and comes in today for a routine follow up visit.  Positive risk factors for bleeding include an age of 75 years or older.  The bleeding index is 'intermediate risk'.  Positive CHADS2 values include History of CHF, History of HTN, and Age > 75 years old.  Anticoagulation responsible provider: Dietrich Pates MD, Molly Maduro.  INR POC: 3.0.  Exp: 09/2010.    Anticoagulation Management Assessment/Plan:      The patient's current anticoagulation dose is Warfarin sodium 2.5 mg tabs: Use as directed by Anticoagualtion Clinic.  The target INR is 2.0-3.0.  The next INR is due 03/21/2010.  Anticoagulation instructions were given to patient.  Results were reviewed/authorized by Vashti Hey, RN.  He was notified by Vashti Hey RN.         Prior Anticoagulation Instructions: INR 2.3 Continue coumadin 3.75mg  once daily   Current Anticoagulation Instructions: INR 3.0 Continue coumadin 3.75mg  once daily

## 2010-06-11 NOTE — Medication Information (Signed)
Summary: Coumadin Clinic  Anticoagulant Therapy  Managed by: Eda Keys, PharmD Referring MD: Dr Hillis Range PCP: Dr Butch Penny Supervising MD: Gala Romney MD, Reuel Boom Indication 1: Atrial Fibrillation (chronic) Lab Used: [facility] [place] Site: [address] INR POC 2.1 INR RANGE 2.0-3.0  Dietary changes: no    Health status changes: no    Bleeding/hemorrhagic complications: no    Recent/future hospitalizations: no    Any changes in medication regimen? yes       Details: recent course of prednisone and clindamycin, finished about 10 days.    Recent/future dental: no  Any missed doses?: no       Is patient compliant with meds? yes      Comments: pt will be d/c from home health by 07/18/09.  Allergies: No Known Drug Allergies  Anticoagulation Management History:      His anticoagulation is being managed by telephone today.  Positive risk factors for bleeding include an age of 90 years or older.  The bleeding index is 'intermediate risk'.  Positive CHADS2 values include Age > 89 years old.  Anticoagulation responsible provider: Suriyah Vergara MD, Reuel Boom.  INR POC: 2.1.    Anticoagulation Management Assessment/Plan:      The patient's current anticoagulation dose is Warfarin sodium 2.5 mg tabs: Use as directed by Anticoagualtion Clinic.  The target INR is 2.0-3.0.  The next INR is due 07/10/2009.  Anticoagulation instructions were given to Ardis Hughs, RN .  Results were reviewed/authorized by Eda Keys, PharmD.  He was notified by Eda Keys.         Prior Anticoagulation Instructions: INR 1.9  Increase to 1.5 tabs each Monday and Friday and 1 tab on all other days.  Recheck in 1 week.    Current Anticoagulation Instructions: INR 2.1  Continue current dosing schedule of 1.5 tablets on Monday and Friday and 1 tablet all other days.  Recheck on Tuesday 07/10/09.  Orders given to Ardis Hughs, RN with [facility].  At next INR check we will  need to schedule  Daniel Brown for follow-up in clinic, as he will be d/c from [facility].

## 2010-06-13 ENCOUNTER — Ambulatory Visit: Admit: 2010-06-13 | Payer: Self-pay

## 2010-06-13 NOTE — Progress Notes (Signed)
Summary: calling regarding pt pulse rate being high  RS  Phone Note Call from Patient Call back at Home Phone [phone]   Caller: Daughter/ [name] Summary of Call: Pulse running high 90-120 for the past few days Initial call taken by: Judie Grieve,  May 10, 2010 9:37 AM  Follow-up for Phone Call        pt daughter states that normal hr in 50's-60's. bp this am 99/72. pulse has been increasing over past 4 days and was 119 this morning. she will bring him in for an ekg.  Follow-up by: Claris Gladden RN,  May 10, 2010 10:10 AM  Additional Follow-up for Phone Call Additional follow up Details #1::        pt ekg: a-flutter. rs appt to 1/6.  pt is asymptomatic.  Additional Follow-up by: Claris Gladden RN,  May 10, 2010 12:21 PM

## 2010-06-13 NOTE — Medication Information (Signed)
Summary: ccr/mj  Anticoagulant Therapy  Managed by: Bethena Midget, RN, BSN Referring MD: Dr Hillis Range PCP: Dr Butch Penny Supervising MD: Tenny Craw MD, Gunnar Fusi Indication 1: Atrial Fibrillation (chronic) Lab Used: [facility] [place] Site: [place] INR POC 3.0 INR RANGE 2.0-3.0  Dietary changes: no    Health status changes: no    Bleeding/hemorrhagic complications: no    Recent/future hospitalizations: no    Any changes in medication regimen? no    Recent/future dental: no  Any missed doses?: no       Is patient compliant with meds? yes      Comments: Seeing Dr Johney Frame today.  Seen in [facility] today.   Allergies: No Known Drug Allergies  Anticoagulation Management History:      The patient is taking warfarin and comes in today for a routine follow up visit.  Positive risk factors for bleeding include an age of 75 years or older.  The bleeding index is 'intermediate risk'.  Positive CHADS2 values include History of CHF, History of HTN, and Age > 61 years old.  Anticoagulation responsible provider: Tenny Craw MD, Gunnar Fusi.  INR POC: 3.0.  Cuvette Lot#: [id number].  Exp: 12/2010.    Anticoagulation Management Assessment/Plan:      The patient's current anticoagulation dose is Warfarin sodium 2.5 mg tabs: Use as directed by Anticoagualtion Clinic.  The target INR is 2.0-3.0.  The next INR is due 06/13/2010.  Anticoagulation instructions were given to patient.  Results were reviewed/authorized by Bethena Midget, RN, BSN.  He was notified by Bethena Midget, RN, BSN.         Prior Anticoagulation Instructions: INR 2.2 Continue coumadin 3.75mg  once daily   Current Anticoagulation Instructions: INR 3.0 Continue 1.5 pill everyday. Recheck in 4 weeks.

## 2010-06-13 NOTE — Assessment & Plan Note (Signed)
Summary: A-flutter-from nurse visit   Visit Type:  Follow-up Primary Provider:  Dr Butch Penny  CC:  no complaints.  History of Present Illness: The patient presents today for cardiology followup.  He has a h/o afib, CHF and stroke. The patient returns today for further evaluation of atrial flutter.  His daughter recently noticed increased pulse and brought him into our clinic where he was documented to have atrial flutter with variable ventricular response.  He is completely asymptomatic with this.  He denies symptoms of palpitations, chest pain, shortness of breath, orthopnea, PND, lower extremity edema, dizziness, presyncope, syncope, or neurologic sequela.  He remains very active at home.  The patient is tolerating medications without difficulties and is otherwise without complaint today.   Current Medications (verified): 1)  Warfarin Sodium 2.5 Mg Tabs (Warfarin Sodium) .... Use As Directed By Anticoagualtion Clinic 2)  Aspirin 81 Mg Tbec (Aspirin) .... Take One Tablet By Mouth Daily 3)  Carvedilol 6.25 Mg Tabs (Carvedilol) .... Take One-Half  Tablet By Mouth Twice A Day 4)  Furosemide 40 Mg Tabs (Furosemide) .... Take 1/2 Tablet By Mouth Every Other Day . 5)  Isosorbide Mononitrate Cr 30 Mg Xr24h-Tab (Isosorbide Mononitrate) .... Take One Tablet By Mouth Daily 6)  Levetiracetam 250 Mg Tabs (Levetiracetam) .... Take One Tablet By Mouth Twice Daily. 7)  Potassium Chloride Crys Cr 20 Meq Cr-Tabs (Potassium Chloride Crys Cr) .... Take One Tablet By Mouth Once Daily 8)  Simvastatin 40 Mg Tabs (Simvastatin) .... 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  He has a remote history of tobacco abuse.  He quit 40   years ago.  He smokes cigars for about 10 years.  No alcohol.  He is  married and recently retired  Review of Systems       All systems are reviewed and negative except as listed in the HPI.   Vital Signs:  Patient profile:   77 year old male Height:      68 inches Weight:      179 pounds BMI:     27.32 Pulse rate:   58 / minute Pulse rhythm:   irregular BP sitting:   108 / 70  (left arm) Cuff size:   regular  Vitals Entered By: Scherrie Bateman, LPN (May 17, 2010 10:39 AM)  Physical Exam  General:  eldelry, NAD Head:  normocephalic and atraumatic Eyes:  PERRLA/EOM intact; conjunctiva and lids normal. Mouth:  Teeth, gums and palate normal. Oral mucosa normal. Neck:  Neck supple, no JVD. No masses, thyromegaly or abnormal cervical nodes. Lungs:  Clear bilaterally to auscultation and percussion. Heart:  iRRR, no m/r/g Abdomen:  Bowel sounds positive; abdomen soft and non-tender without masses, organomegaly, or hernias noted. No hepatosplenomegaly. Msk:  Back normal, normal gait. Muscle strength and tone normal. Extremities:  No clubbing or cyanosis.  trace edema Neurologic:  Alert and oriented x 3.    EKG  Procedure date:  05/17/2010  Findings:      typical appearing atrial flutter, V rate 58 bpm, IVCD  Impression & Recommendations:  Problem # 1:  ATRIAL FIBRILLATION (ICD-427.31) The patient has a h/o atrial fibrillation.  Today, he  presents with asymptomatic typical appearing atrial flutter.  His ventricular rate is presently well controlled and he is appropriately anticoagulated with coumadin.  At this time, I would recommend that we continue rate control with coreg.  He has not tolerated increased doses of coreg in the past due to hypotension.  I have therefore instruced that he increase coreg to 6.25mg  only when his HR >120 bpm.  He will contact me if he becomes  symptomatic with atrial flutter and we could consider rhythm control strategies at that time.  Problem # 2:  CAD (ICD-414.00) no symptoms of ischemia continue ISMN and coreg  Problem # 3:  HYPERTENSION (ICD-401.9) stable he did not tolerate ACE inhibitors previously due to hypotension  Problem # 4:  HYPERLIPIDEMIA (ICD-272.4) stable  Other Orders: EKG w/ Interpretation (93000)  Patient Instructions: 1)  Your physician recommends that you schedule a follow-up appointment in: 6 WEEKS WITH DR Johney Frame 2)  Your physician has recommended you make the following change in your medication: IF HEART INCREASES TO 120 OR ABOVE  TAKE WHOLE TAB OF CARVDEDILOL Prescriptions: WARFARIN SODIUM 2.5 MG TABS (WARFARIN SODIUM) Use as directed by Anticoagualtion Clinic  #45 x 3   Entered by:   Scherrie Bateman, LPN   Authorized by:   Hillis Range, MD   Signed by:   Scherrie Bateman, LPN on 60/45/4098   Method used:   Electronically to        [facility] (retail)       [address]       [place], [place]  [address]       Ph: [phone]       Fax: [phone]   RxID:   [id number]

## 2010-06-13 NOTE — Assessment & Plan Note (Signed)
Summary: ekg-pulse steadily increasing  Nurse Visit   Vital Signs:  Patient profile:   77 year old male Height:      68 inches Weight:      177 pounds BMI:     27.01 O2 Sat:      94 % on Room air  O2 Flow:  Room air Comments pt daughter in law reports that pt hr steadily increasing  over last 4 days. normal hr 50-60. has been between 90-119. Denies SHOB, CP. Pt came in for EKG to determine if in AFIB. EKG results: A-flutter. DOD  recommend no med changes and to have pt seen by Dr. Johney Frame in next week. 1/6 Appt scheduled.    Problems Prior to Update: 1)  Cad  (ICD-414.00) 2)  COPD  (ICD-496) 3)  Hyperlipidemia  (ICD-272.4) 4)  CHF  (ICD-428.0) 5)  Hypertension  (ICD-401.9) 6)  Seizure Disorder  (ICD-780.39) 7)  Atrial Fibrillation  (ICD-427.31)  Current Problems (verified): 1)  Cad  (ICD-414.00) 2)  COPD  (ICD-496) 3)  Hyperlipidemia  (ICD-272.4) 4)  CHF  (ICD-428.0) 5)  Hypertension  (ICD-401.9) 6)  Seizure Disorder  (ICD-780.39) 7)  Atrial Fibrillation  (ICD-427.31)  Medications Prior to Update: 1)  Warfarin Sodium 2.5 Mg Tabs (Warfarin Sodium) .... Use As Directed By Anticoagualtion Clinic 2)  Aspirin 81 Mg Tbec (Aspirin) .... Take One Tablet By Mouth Daily 3)  Carvedilol 6.25 Mg Tabs (Carvedilol) .... Take One-Half  Tablet By Mouth Twice A Day 4)  Furosemide 40 Mg Tabs (Furosemide) .... Take 1/2 Tablet By Mouth Every Other Day . 5)  Isosorbide Mononitrate Cr 30 Mg Xr24h-Tab (Isosorbide Mononitrate) .... Take One Tablet By Mouth Daily 6)  Levetiracetam 250 Mg Tabs (Levetiracetam) .... Take One Tablet By Mouth Twice Daily. 7)  Potassium Chloride Crys Cr 20 Meq Cr-Tabs (Potassium Chloride Crys Cr) .... Take One Tablet By Mouth Once Daily 8)  Simvastatin 40 Mg Tabs (Simvastatin) .... Take One Tablet By Mouth Daily At Bedtime  Current Medications (verified): 1)  Warfarin Sodium 2.5 Mg Tabs (Warfarin Sodium) .... Use As Directed By Anticoagualtion Clinic 2)  Aspirin 81 Mg  Tbec (Aspirin) .... Take One Tablet By Mouth Daily 3)  Carvedilol 6.25 Mg Tabs (Carvedilol) .... Take One-Half  Tablet By Mouth Twice A Day 4)  Furosemide 40 Mg Tabs (Furosemide) .... Take 1/2 Tablet By Mouth Every Other Day . 5)  Isosorbide Mononitrate Cr 30 Mg Xr24h-Tab (Isosorbide Mononitrate) .... Take One Tablet By Mouth Daily 6)  Levetiracetam 250 Mg Tabs (Levetiracetam) .... Take One Tablet By Mouth Twice Daily. 7)  Potassium Chloride Crys Cr 20 Meq Cr-Tabs (Potassium Chloride Crys Cr) .... Take One Tablet By Mouth Once Daily 8)  Simvastatin 40 Mg Tabs (Simvastatin) .... Take One Tablet By Mouth Daily At Bedtime  Allergies (verified): No Known Drug Allergies  Orders Added: 1)  EKG w/ Interpretation [93000]

## 2010-06-24 ENCOUNTER — Encounter (INDEPENDENT_AMBULATORY_CARE_PROVIDER_SITE_OTHER): Payer: Medicare Other

## 2010-06-24 ENCOUNTER — Encounter: Payer: Self-pay | Admitting: Internal Medicine

## 2010-06-24 ENCOUNTER — Ambulatory Visit (INDEPENDENT_AMBULATORY_CARE_PROVIDER_SITE_OTHER): Payer: Medicare Other | Admitting: Internal Medicine

## 2010-06-24 ENCOUNTER — Encounter: Payer: Self-pay | Admitting: Cardiology

## 2010-06-24 DIAGNOSIS — I4892 Unspecified atrial flutter: Secondary | ICD-10-CM

## 2010-06-24 DIAGNOSIS — I4891 Unspecified atrial fibrillation: Secondary | ICD-10-CM

## 2010-06-24 DIAGNOSIS — Z7901 Long term (current) use of anticoagulants: Secondary | ICD-10-CM

## 2010-06-24 LAB — CONVERTED CEMR LAB: POC INR: 4.2

## 2010-07-03 NOTE — Assessment & Plan Note (Signed)
Summary: 6wk follow up/per check out/sf/kwb/kl   Visit Type:  Follow-up Primary Provider:  Dr Butch Penny  CC:  irregular heart beat.  History of Present Illness: The patient presents today for cardiology followup.  He has a h/o afib, CHF and stroke.  He has done well since last being seen in our office.  His HR remains 70s-80s.  He denies symptoms of palpitations, chest pain, shortness of breath, orthopnea, PND, lower extremity edema, dizziness, presyncope, syncope, or neurologic sequela.  He remains very active at home.  The patient is tolerating medications without difficulties and is otherwise without complaint today.   Problems Prior to Update: 1)  Cad  (ICD-414.00) 2)  COPD  (ICD-496) 3)  Hyperlipidemia  (ICD-272.4) 4)  CHF  (ICD-428.0) 5)  Hypertension  (ICD-401.9) 6)  Seizure Disorder  (ICD-780.39) 7)  Atrial Fibrillation  (ICD-427.31)  Current Medications (verified): 1)  Aspirin 81 Mg Tbec (Aspirin) .... Take One Tablet By Mouth Daily 2)  Furosemide 40 Mg Tabs (Furosemide) .... Take 1/2 Tablet By Mouth Every Other Day . 3)  Isosorbide Mononitrate Cr 30 Mg Xr24h-Tab (Isosorbide Mononitrate) .... Take One Tablet By Mouth Daily 4)  Levetiracetam 250 Mg Tabs (Levetiracetam) .... Take One Tablet By Mouth Twice Daily. 5)  Potassium Chloride Crys Cr 20 Meq Cr-Tabs (Potassium Chloride Crys Cr) .... Take One Tablet By Mouth Once Daily 6)  Simvastatin 40 Mg Tabs (Simvastatin) .... Take One Tablet By Mouth Daily At Bedtime 7)  Warfarin Sodium 2.5 Mg Tabs (Warfarin Sodium) .... Use As Directed By Anticoagualtion Clinic 8)  Carvedilol 6.25 Mg Tabs (Carvedilol) .... Take One Tablet By Mouth Twice A Day  Allergies (verified): No Known Drug Allergies  Past History:  Past Medical History: Reviewed history from 05/17/2010 and no changes required. 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  He has a remote history of tobacco abuse.  He quit 40   years ago.  He smokes cigars for about 10 years.  No alcohol.  He is  married and recently retired  Review of Systems       All systems are reviewed and negative except as listed in the HPI.   Vital Signs:  Patient profile:   77 year old male Height:      68 inches Weight:      179 pounds BMI:     27.32 Pulse rate:   84 / minute Pulse rhythm:   irregular Resp:     18 per minute BP sitting:   96 / 70  (left arm) Cuff size:   large  Vitals Entered By: Vikki Ports (June 24, 2010 10:53 AM)  Physical Exam  General:  eldelry, NAD Head:  normocephalic and atraumatic Eyes:  PERRLA/EOM intact; conjunctiva and lids normal. Mouth:  Teeth, gums and palate normal. Oral mucosa normal. Neck:  Neck supple, no JVD. No masses, thyromegaly or abnormal cervical nodes. Lungs:  Clear bilaterally to auscultation and percussion. Heart:  iRRR, no m/r/g Abdomen:  Bowel sounds positive; abdomen soft and non-tender without masses, organomegaly, or hernias noted. No hepatosplenomegaly. Msk:  Back normal, normal gait. Muscle strength and tone normal. Extremities:  No clubbing or cyanosis.  trace edema Neurologic:  Alert and oriented x 3.  Skin:  Intact without lesions or rashes. Psych:  Normal affect.  EKG  Procedure date:  06/24/2010  Findings:      afib, V rate 84, incomplete LBBB  Impression & Recommendations:  Problem # 1:  ATRIAL FIBRILLATION (ICD-427.31) The patient has asypmtomatic afib and atrial flutter.  He is presently rate controlled and appropriately anticoagualted with coumadin. NO changes today.  Problem # 2:  CAD (ICD-414.00) no ischemic symptoms no changes today  Problem # 3:  HYPERTENSION (ICD-401.9) controlled  Patient Instructions: 1)   Your physician recommends that you schedule a follow-up appointment in: 3 months with Dr.Iyani Dresner

## 2010-07-03 NOTE — Medication Information (Signed)
Summary: Coumadin Clinic  Anticoagulant Therapy  Managed by: Georgina Pillion, PharmD Referring MD: Dr Hillis Range PCP: Dr Butch Penny Supervising MD: Tenny Craw MD, Gunnar Fusi Indication 1: Atrial Fibrillation (chronic) Lab Used: [facility] [place] Site: [place] INR POC 4.2 INR RANGE 2.0-3.0  Dietary changes: no    Health status changes: no    Bleeding/hemorrhagic complications: no    Recent/future hospitalizations: no    Any changes in medication regimen? no    Recent/future dental: no  Any missed doses?: no       Is patient compliant with meds? yes       Allergies: No Known Drug Allergies  Anticoagulation Management History:      Positive risk factors for bleeding include an age of 75 years or older.  The bleeding index is 'intermediate risk'.  Positive CHADS2 values include History of CHF, History of HTN, and Age > 55 years old.  Anticoagulation responsible provider: Tenny Craw MD, Gunnar Fusi.  INR POC: 4.2.  Cuvette Lot#: [id number].  Exp: 05/2011.    Anticoagulation Management Assessment/Plan:      The patient's current anticoagulation dose is Warfarin sodium 2.5 mg tabs: Use as directed by Anticoagualtion Clinic.  The target INR is 2.0-3.0.  The next INR is due 07/04/2010.  Anticoagulation instructions were given to patient.  Results were reviewed/authorized by Georgina Pillion, PharmD.  He was notified by Georgina Pillion PharmD.         Prior Anticoagulation Instructions: INR 3.0 Continue 1.5 pill everyday. Recheck in 4 weeks.   Current Anticoagulation Instructions: Hold coumadin for 2 days (Monday and Tuesday) then continue current regimen of 1 1/2 tablets (3.75 mg) daily.  INR 4.2

## 2010-07-04 ENCOUNTER — Encounter: Payer: Self-pay | Admitting: Cardiology

## 2010-07-04 ENCOUNTER — Encounter (INDEPENDENT_AMBULATORY_CARE_PROVIDER_SITE_OTHER): Payer: Medicare Other

## 2010-07-04 DIAGNOSIS — I4891 Unspecified atrial fibrillation: Secondary | ICD-10-CM

## 2010-07-04 DIAGNOSIS — Z7901 Long term (current) use of anticoagulants: Secondary | ICD-10-CM

## 2010-07-04 LAB — CONVERTED CEMR LAB: POC INR: 2.4

## 2010-07-09 NOTE — Medication Information (Signed)
Summary: rov/sp  Anticoagulant Therapy  Managed by: Vashti Hey, RN Referring MD: Dr Hillis Range PCP: Dr Butch Penny Supervising MD: Dietrich Pates MD, Molly Maduro Indication 1: Atrial Fibrillation (chronic) Lab Used: [facility] [place] Site: [place] INR POC 2.4 INR RANGE 2.0-3.0  Dietary changes: no    Health status changes: no    Bleeding/hemorrhagic complications: no    Recent/future hospitalizations: no    Any changes in medication regimen? no    Recent/future dental: no  Any missed doses?: no       Is patient compliant with meds? yes       Allergies: No Known Drug Allergies  Anticoagulation Management History:      The patient is taking warfarin and comes in today for a routine follow up visit.  Positive risk factors for bleeding include an age of 75 years or older.  The bleeding index is 'intermediate risk'.  Positive CHADS2 values include History of CHF, History of HTN, and Age > 46 years old.  Anticoagulation responsible provider: Dietrich Pates MD, Molly Maduro.  INR POC: 2.4.  Cuvette Lot#: [id number].  Exp: 05/2011.    Anticoagulation Management Assessment/Plan:      The patient's current anticoagulation dose is Warfarin sodium 2.5 mg tabs: Use as directed by Anticoagualtion Clinic.  The target INR is 2.0-3.0.  The next INR is due 08/01/2010.  Anticoagulation instructions were given to patient.  Results were reviewed/authorized by Vashti Hey, RN.  He was notified by Vashti Hey RN.         Prior Anticoagulation Instructions: Hold coumadin for 2 days (Monday and Tuesday) then continue current regimen of 1 1/2 tablets (3.75 mg) daily.  INR 4.2  Current Anticoagulation Instructions: INR 2.4 Continue coumadin 3.75mg  once daily

## 2010-07-28 LAB — COMPREHENSIVE METABOLIC PANEL
ALT: 52 U/L (ref 0–53)
ALT: 55 U/L — ABNORMAL HIGH (ref 0–53)
AST: 54 U/L — ABNORMAL HIGH (ref 0–37)
AST: 59 U/L — ABNORMAL HIGH (ref 0–37)
Albumin: 1.9 g/dL — ABNORMAL LOW (ref 3.5–5.2)
Alkaline Phosphatase: 58 U/L (ref 39–117)
Alkaline Phosphatase: 67 U/L (ref 39–117)
BUN: 20 mg/dL (ref 6–23)
CO2: 23 mEq/L (ref 19–32)
CO2: 28 mEq/L (ref 19–32)
CO2: 28 mEq/L (ref 19–32)
Calcium: 8.2 mg/dL — ABNORMAL LOW (ref 8.4–10.5)
Calcium: 8.6 mg/dL (ref 8.4–10.5)
Creatinine, Ser: 1 mg/dL (ref 0.4–1.5)
Creatinine, Ser: 1.05 mg/dL (ref 0.4–1.5)
GFR calc Af Amer: >60 mL/min (ref >60)
GFR calc Af Amer: >60 mL/min (ref >60)
GFR calc non Af Amer: >60 mL/min (ref >60)
GFR calc non Af Amer: >60 mL/min (ref >60)
GFR calc non Af Amer: >60 mL/min (ref >60)
Glucose, Bld: 105 mg/dL — ABNORMAL HIGH (ref 70–99)
Glucose, Bld: 112 mg/dL — ABNORMAL HIGH (ref 70–99)
Potassium: 3.9 mEq/L (ref 3.5–5.1)
Potassium: 4.1 mEq/L (ref 3.5–5.1)
Sodium: 138 mEq/L (ref 135–145)
Sodium: 141 mEq/L (ref 135–145)
Total Protein: 5.2 g/dL — ABNORMAL LOW (ref 6.0–8.3)
Total Protein: 5.3 g/dL — ABNORMAL LOW (ref 6.0–8.3)
Total Protein: 5.7 g/dL — ABNORMAL LOW (ref 6.0–8.3)

## 2010-07-28 LAB — BASIC METABOLIC PANEL
BUN: 15 mg/dL (ref 6–23)
BUN: 18 mg/dL (ref 6–23)
CO2: 28 mEq/L (ref 19–32)
CO2: 31 mEq/L (ref 19–32)
Calcium: 8.5 mg/dL (ref 8.4–10.5)
Chloride: 102 mEq/L (ref 96–112)
Chloride: 106 mEq/L (ref 96–112)
Creatinine, Ser: 1 mg/dL (ref 0.4–1.5)
Creatinine, Ser: 1.19 mg/dL (ref 0.4–1.5)
Creatinine, Ser: 1.25 mg/dL (ref 0.4–1.5)
GFR calc Af Amer: >60 mL/min (ref >60)
GFR calc non Af Amer: 56 mL/min — ABNORMAL LOW (ref >60)
GFR calc non Af Amer: >60 mL/min (ref >60)
Glucose, Bld: 105 mg/dL — ABNORMAL HIGH (ref 70–99)
Glucose, Bld: 185 mg/dL — ABNORMAL HIGH (ref 70–99)
Glucose, Bld: 97 mg/dL (ref 70–99)
Potassium: 4 mEq/L (ref 3.5–5.1)
Sodium: 142 mEq/L (ref 135–145)

## 2010-07-28 LAB — DIFFERENTIAL
Basophils Relative: 0 % (ref 0–1)
Basophils Relative: 0 % (ref 0–1)
Eosinophils Absolute: 0 10*3/uL (ref 0.0–0.7)
Eosinophils Absolute: 0.1 10*3/uL (ref 0.0–0.7)
Eosinophils Relative: 0 % (ref 0–5)
Lymphocytes Relative: 3 % — ABNORMAL LOW (ref 12–46)
Lymphs Abs: 0.8 10*3/uL (ref 0.7–4.0)
Monocytes Absolute: 1.1 10*3/uL — ABNORMAL HIGH (ref 0.1–1.0)
Monocytes Relative: 7 % (ref 3–12)
Neutro Abs: 13.7 10*3/uL — ABNORMAL HIGH (ref 1.7–7.7)
Neutrophils Relative %: 90 % — ABNORMAL HIGH (ref 43–77)

## 2010-07-28 LAB — POCT I-STAT 3, ART BLOOD GAS (G3+)
Acid-base deficit: 1 mmol/L (ref 0.0–2.0)
Bicarbonate: 22.5 mEq/L (ref 20.0–24.0)
pH, Arterial: 7.44 (ref 7.350–7.450)

## 2010-07-28 LAB — PROTIME-INR
INR: 1.83 — ABNORMAL HIGH (ref 0.00–1.49)
INR: 2.15 — ABNORMAL HIGH (ref 0.00–1.49)
Prothrombin Time: 25.1 seconds — ABNORMAL HIGH (ref 11.6–15.2)
Prothrombin Time: 28.1 seconds — ABNORMAL HIGH (ref 11.6–15.2)
Prothrombin Time: 28.3 seconds — ABNORMAL HIGH (ref 11.6–15.2)

## 2010-07-28 LAB — CBC
HCT: 39.5 % (ref 39.0–52.0)
Hemoglobin: 13.6 g/dL (ref 13.0–17.0)
Hemoglobin: 13.9 g/dL (ref 13.0–17.0)
Hemoglobin: 15.3 g/dL (ref 13.0–17.0)
MCHC: 33.5 g/dL (ref 30.0–36.0)
MCHC: 33.7 g/dL (ref 30.0–36.0)
MCHC: 33.7 g/dL (ref 30.0–36.0)
MCHC: 33.9 g/dL (ref 30.0–36.0)
MCV: 89.8 fL (ref 78.0–100.0)
MCV: 91.2 fL (ref 78.0–100.0)
MCV: 91.7 fL (ref 78.0–100.0)
MCV: 92 fL (ref 78.0–100.0)
Platelets: 207 10*3/uL (ref 150–400)
Platelets: 226 10*3/uL (ref 150–400)
Platelets: 240 10*3/uL (ref 150–400)
Platelets: 258 10*3/uL (ref 150–400)
RBC: 4.39 MIL/uL (ref 4.22–5.81)
RBC: 4.49 MIL/uL (ref 4.22–5.81)
RBC: 4.52 MIL/uL (ref 4.22–5.81)
RBC: 4.98 MIL/uL (ref 4.22–5.81)
RDW: 12.9 % (ref 11.5–15.5)
RDW: 12.9 % (ref 11.5–15.5)
RDW: 13 % (ref 11.5–15.5)
RDW: 13 % (ref 11.5–15.5)
RDW: 13.1 % (ref 11.5–15.5)

## 2010-07-28 LAB — URINE MICROSCOPIC-ADD ON

## 2010-07-28 LAB — HEPATIC FUNCTION PANEL
Bilirubin, Direct: 0.3 mg/dL (ref 0.0–0.3)
Indirect Bilirubin: 0.5 mg/dL (ref 0.3–0.9)
Total Protein: 5.5 g/dL — ABNORMAL LOW (ref 6.0–8.3)

## 2010-07-28 LAB — POCT I-STAT 3, VENOUS BLOOD GAS (G3P V)
Acid-Base Excess: 1 mmol/L (ref 0.0–2.0)
Acid-base deficit: 1 mmol/L (ref 0.0–2.0)
Bicarbonate: 23 mEq/L (ref 20.0–24.0)
O2 Saturation: 59 %
O2 Saturation: 66 %
TCO2: 24 mmol/L (ref 0–100)
TCO2: 25 mmol/L (ref 0–100)
pCO2, Ven: 33.2 mmHg — ABNORMAL LOW (ref 45.0–50.0)
pO2, Ven: 30 mmHg (ref 30.0–45.0)

## 2010-07-28 LAB — CLOSTRIDIUM DIFFICILE EIA: C difficile Toxins A+B, EIA: NEGATIVE

## 2010-07-28 LAB — GLUCOSE, CAPILLARY
Glucose-Capillary: 102 mg/dL — ABNORMAL HIGH (ref 70–99)
Glucose-Capillary: 109 mg/dL — ABNORMAL HIGH (ref 70–99)
Glucose-Capillary: 111 mg/dL — ABNORMAL HIGH (ref 70–99)
Glucose-Capillary: 140 mg/dL — ABNORMAL HIGH (ref 70–99)
Glucose-Capillary: 153 mg/dL — ABNORMAL HIGH (ref 70–99)
Glucose-Capillary: 158 mg/dL — ABNORMAL HIGH (ref 70–99)

## 2010-07-28 LAB — CK TOTAL AND CKMB (NOT AT ARMC)
CK, MB: 2.8 ng/mL (ref 0.3–4.0)
Relative Index: INVALID (ref 0.0–2.5)
Total CK: 46 U/L (ref 7–232)

## 2010-07-28 LAB — URINALYSIS, ROUTINE W REFLEX MICROSCOPIC
Nitrite: NEGATIVE
Specific Gravity, Urine: 1.022 (ref 1.005–1.030)
Urobilinogen, UA: 1 mg/dL (ref 0.0–1.0)

## 2010-07-28 LAB — HOMOCYSTEINE: Homocysteine: 14.5 umol/L (ref 4.0–15.4)

## 2010-07-28 LAB — URINE CULTURE

## 2010-07-28 LAB — LIPID PANEL
LDL Cholesterol: 64 mg/dL (ref 0–99)
Total CHOL/HDL Ratio: 4.1 RATIO
VLDL: 14 mg/dL (ref 0–40)

## 2010-07-28 LAB — MAGNESIUM: Magnesium: 1.6 mg/dL (ref 1.5–2.5)

## 2010-07-30 ENCOUNTER — Encounter: Payer: Self-pay | Admitting: Internal Medicine

## 2010-07-30 DIAGNOSIS — I4891 Unspecified atrial fibrillation: Secondary | ICD-10-CM

## 2010-07-30 DIAGNOSIS — Z7901 Long term (current) use of anticoagulants: Secondary | ICD-10-CM | POA: Insufficient documentation

## 2010-08-01 ENCOUNTER — Ambulatory Visit (INDEPENDENT_AMBULATORY_CARE_PROVIDER_SITE_OTHER): Payer: Medicare Other | Admitting: *Deleted

## 2010-08-01 DIAGNOSIS — Z7901 Long term (current) use of anticoagulants: Secondary | ICD-10-CM

## 2010-08-01 DIAGNOSIS — I4891 Unspecified atrial fibrillation: Secondary | ICD-10-CM

## 2010-08-12 LAB — HEMOGLOBIN A1C: Hgb A1c MFr Bld: 6 % (ref 4.6–6.1)

## 2010-08-12 LAB — COMPREHENSIVE METABOLIC PANEL
Albumin: 3.2 g/dL — ABNORMAL LOW (ref 3.5–5.2)
Alkaline Phosphatase: 59 U/L (ref 39–117)
BUN: 27 mg/dL — ABNORMAL HIGH (ref 6–23)
CO2: 25 mEq/L (ref 19–32)
Chloride: 101 mEq/L (ref 96–112)
GFR calc non Af Amer: 47 mL/min — ABNORMAL LOW (ref >60)
Glucose, Bld: 59 mg/dL — ABNORMAL LOW (ref 70–99)
Potassium: 3.2 mEq/L — ABNORMAL LOW (ref 3.5–5.1)
Total Bilirubin: 1.2 mg/dL (ref 0.3–1.2)

## 2010-08-12 LAB — TSH: TSH: 1.056 u[IU]/mL (ref 0.350–4.500)

## 2010-08-12 LAB — BASIC METABOLIC PANEL
GFR calc non Af Amer: 58 mL/min — ABNORMAL LOW (ref >60)
Potassium: 3.5 mEq/L (ref 3.5–5.1)
Sodium: 141 mEq/L (ref 135–145)

## 2010-08-12 LAB — URINE MICROSCOPIC-ADD ON

## 2010-08-12 LAB — CBC
HCT: 41.2 % (ref 39.0–52.0)
Hemoglobin: 13.9 g/dL (ref 13.0–17.0)
Hemoglobin: 14.5 g/dL (ref 13.0–17.0)
RBC: 4.74 MIL/uL (ref 4.22–5.81)
WBC: 11.8 10*3/uL — ABNORMAL HIGH (ref 4.0–10.5)
WBC: 15.9 10*3/uL — ABNORMAL HIGH (ref 4.0–10.5)

## 2010-08-12 LAB — BRAIN NATRIURETIC PEPTIDE
Pro B Natriuretic peptide (BNP): 1706 pg/mL — ABNORMAL HIGH (ref 0.0–100.0)
Pro B Natriuretic peptide (BNP): 2063 pg/mL — ABNORMAL HIGH (ref 0.0–100.0)

## 2010-08-12 LAB — URINALYSIS, ROUTINE W REFLEX MICROSCOPIC
Bilirubin Urine: NEGATIVE
Bilirubin Urine: NEGATIVE
Glucose, UA: NEGATIVE mg/dL
Glucose, UA: NEGATIVE mg/dL
Ketones, ur: NEGATIVE mg/dL
Specific Gravity, Urine: 1.02 (ref 1.005–1.030)
Urobilinogen, UA: 0.2 mg/dL (ref 0.0–1.0)
pH: 5 (ref 5.0–8.0)
pH: 5.5 (ref 5.0–8.0)

## 2010-08-12 LAB — DIFFERENTIAL
Basophils Absolute: 0.4 10*3/uL — ABNORMAL HIGH (ref 0.0–0.1)
Eosinophils Relative: 1 % (ref 0–5)
Lymphocytes Relative: 4 % — ABNORMAL LOW (ref 12–46)
Lymphocytes Relative: 9 % — ABNORMAL LOW (ref 12–46)
Lymphs Abs: 0.6 10*3/uL — ABNORMAL LOW (ref 0.7–4.0)
Lymphs Abs: 1 10*3/uL (ref 0.7–4.0)
Monocytes Absolute: 1.1 10*3/uL — ABNORMAL HIGH (ref 0.1–1.0)
Monocytes Absolute: 1.5 10*3/uL — ABNORMAL HIGH (ref 0.1–1.0)
Monocytes Relative: 10 % (ref 3–12)
Neutro Abs: 13.4 10*3/uL — ABNORMAL HIGH (ref 1.7–7.7)

## 2010-08-12 LAB — LIPID PANEL
Cholesterol: 100 mg/dL (ref 0–200)
HDL: 25 mg/dL — ABNORMAL LOW (ref >39)
Total CHOL/HDL Ratio: 4 RATIO

## 2010-08-12 LAB — CULTURE, BLOOD (ROUTINE X 2)

## 2010-08-12 LAB — URINE CULTURE

## 2010-08-12 LAB — POCT CARDIAC MARKERS
CKMB, poc: 4.4 ng/mL (ref 1.0–8.0)
Troponin i, poc: <0.05 ng/mL (ref 0.00–0.09)

## 2010-08-12 LAB — APTT: aPTT: 29 seconds (ref 24–37)

## 2010-08-12 LAB — PROTIME-INR
INR: 1.45 (ref 0.00–1.49)
Prothrombin Time: 16.6 seconds — ABNORMAL HIGH (ref 11.6–15.2)
Prothrombin Time: 17.5 seconds — ABNORMAL HIGH (ref 11.6–15.2)

## 2010-08-12 LAB — CARDIAC PANEL(CRET KIN+CKTOT+MB+TROPI): CK, MB: 4 ng/mL (ref 0.3–4.0)

## 2010-08-29 ENCOUNTER — Ambulatory Visit (INDEPENDENT_AMBULATORY_CARE_PROVIDER_SITE_OTHER): Payer: Medicare Other | Admitting: *Deleted

## 2010-08-29 ENCOUNTER — Encounter: Payer: Medicare Other | Admitting: *Deleted

## 2010-08-29 DIAGNOSIS — Z7901 Long term (current) use of anticoagulants: Secondary | ICD-10-CM

## 2010-08-29 DIAGNOSIS — I4891 Unspecified atrial fibrillation: Secondary | ICD-10-CM

## 2010-09-17 ENCOUNTER — Encounter: Payer: Self-pay | Admitting: Internal Medicine

## 2010-09-23 ENCOUNTER — Encounter: Payer: Self-pay | Admitting: Internal Medicine

## 2010-09-23 ENCOUNTER — Ambulatory Visit (INDEPENDENT_AMBULATORY_CARE_PROVIDER_SITE_OTHER): Payer: Medicare Other | Admitting: Internal Medicine

## 2010-09-23 ENCOUNTER — Ambulatory Visit (INDEPENDENT_AMBULATORY_CARE_PROVIDER_SITE_OTHER): Payer: Medicare Other | Admitting: *Deleted

## 2010-09-23 DIAGNOSIS — I2510 Atherosclerotic heart disease of native coronary artery without angina pectoris: Secondary | ICD-10-CM

## 2010-09-23 DIAGNOSIS — I4891 Unspecified atrial fibrillation: Secondary | ICD-10-CM

## 2010-09-23 DIAGNOSIS — I5022 Chronic systolic (congestive) heart failure: Secondary | ICD-10-CM

## 2010-09-23 DIAGNOSIS — Z7901 Long term (current) use of anticoagulants: Secondary | ICD-10-CM

## 2010-09-23 DIAGNOSIS — I10 Essential (primary) hypertension: Secondary | ICD-10-CM

## 2010-09-23 DIAGNOSIS — I519 Heart disease, unspecified: Secondary | ICD-10-CM

## 2010-09-23 DIAGNOSIS — I2589 Other forms of chronic ischemic heart disease: Secondary | ICD-10-CM

## 2010-09-23 LAB — POCT INR: INR: 2.7

## 2010-09-23 NOTE — Assessment & Plan Note (Signed)
stable °

## 2010-09-23 NOTE — Patient Instructions (Addendum)
Your physician wants you to follow-up in: 4 months with Dr Lona Millard will receive a reminder letter in the mail two months in advance. If you don't receive a letter, please call our office to schedule the follow-up appointment.  Your physician has requested that you have an echocardiogram. Echocardiography is a painless test that uses sound waves to create images of your heart. It provides your doctor with information about the size and shape of your heart and how well your heart's chambers and valves are working. This procedure takes approximately one hour. There are no restrictions for this procedure.  427.31  428.22

## 2010-09-23 NOTE — Assessment & Plan Note (Signed)
No ischemic symptoms No changes today 

## 2010-09-23 NOTE — Assessment & Plan Note (Signed)
Permanent atrial fibrillation Continue coumadin Rate controlled

## 2010-09-23 NOTE — Assessment & Plan Note (Signed)
euvolemic on exam We will repeat echo to evaluate interval change in EF He is on coreg but not an ACE inhibitor due to frequent low bp.  We should try to add low dose ace inhibitor depending on echo findings upon return.

## 2010-09-23 NOTE — Progress Notes (Signed)
The patient presents today for routine cardiology followup.  Since last being seen in our clinic, the patient reports doing very well.  Today, he denies symptoms of palpitations, chest pain, shortness of breath, orthopnea, PND, lower extremity edema, dizziness, presyncope, syncope, or neurologic sequela.  His family finds that his BP is frequently low in the morning (SBP <100) though he is not symptomatic with this.  The patient feels that he is tolerating medications without difficulties and is otherwise without complaint today.   Past Medical History  Diagnosis Date  . COPD (chronic obstructive pulmonary disease)   . Hyperlipidemia   . CAD (coronary artery disease)     by cath 1/11, medical management advised  . Cardiomyopathy, ischemic     EF 40% by echo 1/11  . CVD (cardiovascular disease)     s/p MCA stroke 1/11  . HTN (hypertension)   . Seizure disorder   . Permanent atrial fibrillation   . Atrial flutter     typical appearing   No past surgical history on file.  Current Outpatient Prescriptions  Medication Sig Dispense Refill  . aspirin 81 MG tablet Take 81 mg by mouth daily.        . carvedilol (COREG) 6.25 MG tablet Take 3.125 mg by mouth 2 (two) times daily.       . furosemide (LASIX) 40 MG tablet Take 1/2 tablet by mouth every other day       . isosorbide mononitrate (IMDUR) 30 MG 24 hr tablet Take 30 mg by mouth daily.        Marland Kitchen levETIRAcetam (KEPPRA) 250 MG tablet Take 250 mg by mouth 2 (two) times daily.        . potassium chloride SA (K-DUR,KLOR-CON) 20 MEQ tablet Take 20 mEq by mouth daily.        . simvastatin (ZOCOR) 40 MG tablet Take 40 mg by mouth at bedtime.        Marland Kitchen warfarin (COUMADIN) 2.5 MG tablet Take by mouth as directed.          Allergies not on file  History   Social History  . Marital Status: Single    Spouse Name: N/A    Number of Children: N/A  . Years of Education: N/A   Occupational History  . Retired    Social History Main Topics  .  Smoking status: Former Games developer  . Smokeless tobacco: Not on file   Comment: quit 40 years ago  . Alcohol Use: No  . Drug Use: No  . Sexually Active: Not on file   Other Topics Concern  . Not on file   Social History Narrative   Lives in [place] with family.   ROS-  All systems are reviewed and are negative except as outlined in the HPI above    Physical Exam: Filed Vitals:   09/23/10 0941  BP: 110/60  Height: 5\' 9"  (1.753 m)  Weight: 181 lb (82.101 kg)    GEN- The patient is well appearing, alert and oriented x 3 today.   Head- normocephalic, atraumatic Eyes-  Sclera clear, conjunctiva pink Ears- hearing intact Oropharynx- clear Neck- supple, no JVP Lymph- no cervical lymphadenopathy Lungs- Clear to ausculation bilaterally, normal work of breathing Heart- irregular rate and rhythm, no murmurs, rubs or gallops, PMI not laterally displaced GI- soft, NT, ND, + BS Extremities- no clubbing, cyanosis, or edema MS- no significant deformity or atrophy Skin- no rash or lesion Psych- euthymic mood, full affect Neuro- strength and  sensation are intact  EKG today reveals afib V rate 66 bpm, IVCD  Assessment and Plan:

## 2010-09-26 ENCOUNTER — Encounter: Payer: Medicare Other | Admitting: *Deleted

## 2010-10-02 ENCOUNTER — Ambulatory Visit (HOSPITAL_COMMUNITY): Payer: Medicare Other | Attending: Internal Medicine | Admitting: Radiology

## 2010-10-02 DIAGNOSIS — I2589 Other forms of chronic ischemic heart disease: Secondary | ICD-10-CM

## 2010-10-02 DIAGNOSIS — I080 Rheumatic disorders of both mitral and aortic valves: Secondary | ICD-10-CM | POA: Insufficient documentation

## 2010-10-02 DIAGNOSIS — I2510 Atherosclerotic heart disease of native coronary artery without angina pectoris: Secondary | ICD-10-CM | POA: Insufficient documentation

## 2010-10-02 DIAGNOSIS — I079 Rheumatic tricuspid valve disease, unspecified: Secondary | ICD-10-CM | POA: Insufficient documentation

## 2010-10-02 DIAGNOSIS — I5022 Chronic systolic (congestive) heart failure: Secondary | ICD-10-CM

## 2010-10-08 ENCOUNTER — Telehealth: Payer: Self-pay | Admitting: *Deleted

## 2010-10-08 MED ORDER — LISINOPRIL 2.5 MG PO TABS: 2.5000 mg | ORAL_TABLET | Freq: Every day | ORAL | Status: DC

## 2010-10-08 NOTE — Telephone Encounter (Signed)
Will call in Lisinopril 2.5mg  daily

## 2010-10-21 ENCOUNTER — Ambulatory Visit (INDEPENDENT_AMBULATORY_CARE_PROVIDER_SITE_OTHER): Payer: Medicare Other | Admitting: *Deleted

## 2010-10-21 DIAGNOSIS — Z7901 Long term (current) use of anticoagulants: Secondary | ICD-10-CM

## 2010-10-21 DIAGNOSIS — I4891 Unspecified atrial fibrillation: Secondary | ICD-10-CM

## 2010-10-21 LAB — POCT INR: INR: 2.6

## 2010-11-18 ENCOUNTER — Ambulatory Visit (INDEPENDENT_AMBULATORY_CARE_PROVIDER_SITE_OTHER): Payer: Medicare Other | Admitting: *Deleted

## 2010-11-18 DIAGNOSIS — I4891 Unspecified atrial fibrillation: Secondary | ICD-10-CM

## 2010-11-18 DIAGNOSIS — Z7901 Long term (current) use of anticoagulants: Secondary | ICD-10-CM

## 2010-11-18 LAB — POCT INR: INR: 2.8

## 2010-12-16 ENCOUNTER — Ambulatory Visit (INDEPENDENT_AMBULATORY_CARE_PROVIDER_SITE_OTHER): Payer: Medicare Other | Admitting: *Deleted

## 2010-12-16 DIAGNOSIS — Z7901 Long term (current) use of anticoagulants: Secondary | ICD-10-CM

## 2010-12-16 DIAGNOSIS — I4891 Unspecified atrial fibrillation: Secondary | ICD-10-CM

## 2010-12-16 LAB — POCT INR: INR: 2.5

## 2010-12-30 ENCOUNTER — Telehealth: Payer: Self-pay | Admitting: Internal Medicine

## 2010-12-30 NOTE — Telephone Encounter (Signed)
Per pt granddaughter calling pt HR has been low 30's around 40 or 38. Pt is not c/o of any other symptoms please return call to pt granddaughter to advise/discuss.

## 2010-12-30 NOTE — Telephone Encounter (Signed)
They are getting the HR with an automatic BP cuff  With his afib it is probably not picking up all the beats He he starts to c/o dizziness and they notice a change in him please advise  He is asymptomatic and this is prob not an accurate reading

## 2011-01-06 ENCOUNTER — Telehealth: Payer: Self-pay | Admitting: Internal Medicine

## 2011-01-06 NOTE — Telephone Encounter (Signed)
Pt granddaughter calling. Pt went out yesterday for a little bit and was walking really slow. When pt came back inside granddaughter took pt BP it was normal however pt pulse was low, 34. Pt pulse normally runs low, but pt and granddaughter are concerned about pt pulse being so low. Pt is also c/o just not feeling good. Pt granddaughter wanted to know if pt could be seen today. Please return granddaughter call to advise/discuss.

## 2011-01-06 NOTE — Telephone Encounter (Signed)
Returning call back to nurse.  

## 2011-01-06 NOTE — Telephone Encounter (Signed)
Lmom for patient to call me back regarding her grandfather

## 2011-01-08 ENCOUNTER — Encounter: Payer: Self-pay | Admitting: Internal Medicine

## 2011-01-08 NOTE — Telephone Encounter (Signed)
This encounter was created in error - please disregard.

## 2011-01-08 NOTE — Telephone Encounter (Signed)
lmom for [name] to call me back

## 2011-01-08 NOTE — Telephone Encounter (Signed)
Daniel Brown is returning your call re pt.

## 2011-01-08 NOTE — Telephone Encounter (Signed)
Daniel Brown returned my call He is seeing his PCP tomorrow  I offered to move his appointment with Dr Johney Frame up to 01/16/11 at 4:30 if his PCP fells he needs to be seen prior to 01/20/11

## 2011-01-09 ENCOUNTER — Emergency Department (HOSPITAL_COMMUNITY): Payer: Medicare Other

## 2011-01-09 ENCOUNTER — Inpatient Hospital Stay (HOSPITAL_COMMUNITY)
Admission: EM | Admit: 2011-01-09 | Discharge: 2011-01-11 | DRG: 244 | Disposition: A | Discharge status: Home / Self Care | Payer: Medicare Other | Attending: Internal Medicine | Admitting: Internal Medicine

## 2011-01-09 ENCOUNTER — Ambulatory Visit (INDEPENDENT_AMBULATORY_CARE_PROVIDER_SITE_OTHER): Payer: Medicare Other | Admitting: Cardiovascular Disease

## 2011-01-09 ENCOUNTER — Encounter: Payer: Self-pay | Admitting: Cardiovascular Disease

## 2011-01-09 ENCOUNTER — Inpatient Hospital Stay (HOSPITAL_COMMUNITY): Payer: Medicare Other

## 2011-01-09 DIAGNOSIS — J449 Chronic obstructive pulmonary disease, unspecified: Secondary | ICD-10-CM | POA: Diagnosis present

## 2011-01-09 DIAGNOSIS — I2510 Atherosclerotic heart disease of native coronary artery without angina pectoris: Secondary | ICD-10-CM

## 2011-01-09 DIAGNOSIS — E785 Hyperlipidemia, unspecified: Secondary | ICD-10-CM | POA: Diagnosis present

## 2011-01-09 DIAGNOSIS — Z8673 Personal history of transient ischemic attack (TIA), and cerebral infarction without residual deficits: Secondary | ICD-10-CM

## 2011-01-09 DIAGNOSIS — I498 Other specified cardiac arrhythmias: Principal | ICD-10-CM | POA: Diagnosis present

## 2011-01-09 DIAGNOSIS — I2589 Other forms of chronic ischemic heart disease: Secondary | ICD-10-CM | POA: Diagnosis present

## 2011-01-09 DIAGNOSIS — Z7901 Long term (current) use of anticoagulants: Secondary | ICD-10-CM

## 2011-01-09 DIAGNOSIS — I4891 Unspecified atrial fibrillation: Secondary | ICD-10-CM

## 2011-01-09 DIAGNOSIS — I059 Rheumatic mitral valve disease, unspecified: Secondary | ICD-10-CM

## 2011-01-09 DIAGNOSIS — I4892 Unspecified atrial flutter: Secondary | ICD-10-CM | POA: Diagnosis present

## 2011-01-09 DIAGNOSIS — Z7982 Long term (current) use of aspirin: Secondary | ICD-10-CM

## 2011-01-09 DIAGNOSIS — J4489 Other specified chronic obstructive pulmonary disease: Secondary | ICD-10-CM | POA: Diagnosis present

## 2011-01-09 DIAGNOSIS — I10 Essential (primary) hypertension: Secondary | ICD-10-CM | POA: Diagnosis present

## 2011-01-09 LAB — COMPREHENSIVE METABOLIC PANEL
ALT: 27 U/L (ref 0–53)
AST: 27 U/L (ref 0–37)
CO2: 23 mEq/L (ref 19–32)
Chloride: 110 mEq/L (ref 96–112)
GFR calc non Af Amer: >60 mL/min (ref >60)
Potassium: 5.2 mEq/L — ABNORMAL HIGH (ref 3.5–5.1)
Sodium: 144 mEq/L (ref 135–145)
Total Bilirubin: 1 mg/dL (ref 0.3–1.2)

## 2011-01-09 LAB — CBC
MCH: 30.8 pg (ref 26.0–34.0)
MCHC: 34.6 g/dL (ref 30.0–36.0)
Platelets: 131 10*3/uL — ABNORMAL LOW (ref 150–400)
RBC: 5.16 MIL/uL (ref 4.22–5.81)
RDW: 13 % (ref 11.5–15.5)

## 2011-01-09 LAB — PROTIME-INR: INR: 3.67 — ABNORMAL HIGH (ref 0.00–1.49)

## 2011-01-09 LAB — DIFFERENTIAL
Basophils Relative: 0 % (ref 0–1)
Eosinophils Absolute: 0.2 10*3/uL (ref 0.0–0.7)
Eosinophils Relative: 3 % (ref 0–5)
Lymphocytes Relative: 16 % (ref 12–46)
Lymphs Abs: 1.2 10*3/uL (ref 0.7–4.0)
Monocytes Absolute: 0.8 10*3/uL (ref 0.1–1.0)
Neutro Abs: 5.4 10*3/uL (ref 1.7–7.7)

## 2011-01-09 LAB — BASIC METABOLIC PANEL
CO2: 25 mEq/L (ref 19–32)
Calcium: 9.9 mg/dL (ref 8.4–10.5)
Creatinine, Ser: 1.02 mg/dL (ref 0.50–1.35)
Glucose, Bld: 95 mg/dL (ref 70–99)

## 2011-01-09 LAB — POCT I-STAT TROPONIN I: Troponin i, poc: 0.03 ng/mL (ref 0.00–0.08)

## 2011-01-09 LAB — TROPONIN I: Troponin I: <0.3 ng/mL (ref <0.30)

## 2011-01-09 LAB — HEMOGLOBIN A1C: Hgb A1c MFr Bld: 5.5 % (ref <5.7)

## 2011-01-09 LAB — TSH: TSH: 3.069 u[IU]/mL (ref 0.350–4.500)

## 2011-01-09 NOTE — Assessment & Plan Note (Signed)
The patient has marked bradycardia and clinical symptoms secondary to hypoperfusion with lightheadedness and near syncope. His blood pressure by my check is 120/80 so I did not transcutaneously pace him in the office. EMS has been called and he will be transported to [facility] room. The patient's carvedilol will obviously be held and I strongly suspect he will need a permanent pacemaker. The inpatient cardiology team has been notified and he will be seen immediately on arrival to the emergency department.

## 2011-01-09 NOTE — Progress Notes (Signed)
HPI:  This is an 77 year old gentleman presenting with weakness and dizziness. He was seen by his primary care physician and noted to have marked bradycardia. The patient was brought here immediately by his daughter via private vehicle. I reviewed his EKG which was faxed over and it demonstrated atrial fibrillation with slow ventricular response with a heart rate of about 30 beats per minute. We contacted the patient and he was already in our waiting room.  He was brought back immediately and examined. He complains of 2-3 days of extreme weakness and dizziness. He has not had frank syncope but he has had near vision loss on a few occasions. He has not had chest pain, dyspnea, edema, orthopnea, or PND. He feels very weak when he stands up.  The patient has noticed that on his blood pressure monitor the heart rate as read in the 30s over the last several days.  The patient's records have been reviewed and he has permanent atrial fibrillation.  He is anticoagulated with warfarin.  He saw Dr. Johney Frame in May of this year an EKG at that time showed a heart rate of 66 beats per minute.  Outpatient Encounter Prescriptions as of 01/09/2011  Medication Sig Dispense Refill  . aspirin 81 MG tablet Take 81 mg by mouth daily.        . carvedilol (COREG) 6.25 MG tablet Take 3.125 mg by mouth 2 (two) times daily.       . furosemide (LASIX) 40 MG tablet Take 1/2 tablet by mouth every other day       . isosorbide mononitrate (IMDUR) 30 MG 24 hr tablet Take 30 mg by mouth daily.        Marland Kitchen levETIRAcetam (KEPPRA) 250 MG tablet Take 250 mg by mouth 2 (two) times daily.        Marland Kitchen lisinopril (PRINIVIL,ZESTRIL) 2.5 MG tablet Take 1 tablet (2.5 mg total) by mouth daily.  30 tablet  11  . potassium chloride SA (K-DUR,KLOR-CON) 20 MEQ tablet Take 20 mEq by mouth daily.        . simvastatin (ZOCOR) 40 MG tablet Take 40 mg by mouth at bedtime.        Marland Kitchen warfarin (COUMADIN) 2.5 MG tablet Take by mouth as directed.          Review of  patient's allergies indicates not on file.  Past Medical History  Diagnosis Date  . COPD (chronic obstructive pulmonary disease)   . Hyperlipidemia   . CAD (coronary artery disease)     by cath 1/11, medical management advised  . Cardiomyopathy, ischemic     EF 40% by echo 1/11  . CVD (cardiovascular disease)     s/p MCA stroke 1/11  . HTN (hypertension)   . Seizure disorder   . Permanent atrial fibrillation   . Atrial flutter     typical appearing    No past surgical history on file.  History   Social History  . Marital Status: Single    Spouse Name: N/A    Number of Children: N/A  . Years of Education: N/A   Occupational History  . Retired    Social History Main Topics  . Smoking status: Former Games developer  . Smokeless tobacco: Not on file   Comment: quit 40 years ago  . Alcohol Use: No  . Drug Use: No  . Sexually Active: Not on file   Other Topics Concern  . Not on file   Social History Narrative   Lives  in [place] with family.    No family history on file.  ROS: General: no fevers/chills/night sweats Eyes: no blurry vision, diplopia, or amaurosis ENT: no sore throat or hearing loss Resp: no cough, wheezing, or hemoptysis CV: no edema or palpitations GI: no abdominal pain, nausea, vomiting, diarrhea, or constipation GU: no dysuria, frequency, or hematuria Skin: no rash Neuro: no headache, numbness, tingling, or weakness of extremities Musculoskeletal: no joint pain or swelling Heme: no bleeding, DVT, or easy bruising Endo: no polydipsia or polyuria  BP 120/80  Pulse 29  Wt 182 lb (82.555 kg)  PHYSICAL EXAM: Pt is alert and oriented, he is in no acute distress. Elderly, pale-appearing male. HEENT: normal Neck: JVP normal. Carotid upstrokes normal without bruits. No thyromegaly. Lungs: equal expansion, clear bilaterally CV: Apex is discrete and nondisplaced, marked bradycardia without murmur or gallop Abd: soft, NT, +BS, no bruit, no  hepatosplenomegaly Back: no CVA tenderness Ext: no C/C/E        Femoral pulses 2+        DP/PT pulses intact and = Skin: warm and dry without rash Neuro: CNII-XII intact             Strength intact = bilaterally  EKG:  Atrial fibrillation with slow ventricular response 29 beats per minute, left bundle branch block morphology.  ASSESSMENT AND PLAN:

## 2011-01-09 NOTE — Assessment & Plan Note (Signed)
The patient has a left bundle branch morphology ECG which is nondiagnostic. This is unchanged from his previous tracing. He is having no angina and I suspect his primary problem is conduction-related rather than ischemic. He should still have cardiac markers cycled.

## 2011-01-10 DIAGNOSIS — I498 Other specified cardiac arrhythmias: Secondary | ICD-10-CM

## 2011-01-10 HISTORY — PX: PACEMAKER INSERTION: SHX728

## 2011-01-10 LAB — PROTIME-INR
INR: 2.69 — ABNORMAL HIGH (ref 0.00–1.49)
Prothrombin Time: 29 seconds — ABNORMAL HIGH (ref 11.6–15.2)

## 2011-01-10 LAB — BASIC METABOLIC PANEL
CO2: 28 mEq/L (ref 19–32)
Calcium: 9.6 mg/dL (ref 8.4–10.5)
Chloride: 107 mEq/L (ref 96–112)
Glucose, Bld: 101 mg/dL — ABNORMAL HIGH (ref 70–99)
Sodium: 142 mEq/L (ref 135–145)

## 2011-01-10 LAB — LIPID PANEL
Cholesterol: 116 mg/dL (ref 0–200)
HDL: 42 mg/dL (ref >39)
Total CHOL/HDL Ratio: 2.8 RATIO

## 2011-01-10 LAB — CARDIAC PANEL(CRET KIN+CKTOT+MB+TROPI)
Relative Index: 3.2 — ABNORMAL HIGH (ref 0.0–2.5)
Total CK: 438 U/L — ABNORMAL HIGH (ref 7–232)
Troponin I: <0.3 ng/mL (ref <0.30)

## 2011-01-11 ENCOUNTER — Inpatient Hospital Stay (HOSPITAL_COMMUNITY): Payer: Medicare Other

## 2011-01-11 LAB — BASIC METABOLIC PANEL
CO2: 26 mEq/L (ref 19–32)
Chloride: 108 mEq/L (ref 96–112)
Creatinine, Ser: 1 mg/dL (ref 0.50–1.35)
GFR calc Af Amer: >60 mL/min (ref >60)
Potassium: 4.4 mEq/L (ref 3.5–5.1)

## 2011-01-11 LAB — PROTIME-INR: INR: 1.57 — ABNORMAL HIGH (ref 0.00–1.49)

## 2011-01-14 ENCOUNTER — Encounter: Payer: Self-pay | Admitting: *Deleted

## 2011-01-20 ENCOUNTER — Encounter: Payer: Self-pay | Admitting: Internal Medicine

## 2011-01-20 ENCOUNTER — Ambulatory Visit (INDEPENDENT_AMBULATORY_CARE_PROVIDER_SITE_OTHER): Payer: Medicare Other | Admitting: *Deleted

## 2011-01-20 ENCOUNTER — Ambulatory Visit (INDEPENDENT_AMBULATORY_CARE_PROVIDER_SITE_OTHER): Payer: Medicare Other | Admitting: Internal Medicine

## 2011-01-20 DIAGNOSIS — I442 Atrioventricular block, complete: Secondary | ICD-10-CM | POA: Insufficient documentation

## 2011-01-20 DIAGNOSIS — I4891 Unspecified atrial fibrillation: Secondary | ICD-10-CM

## 2011-01-20 DIAGNOSIS — Z7901 Long term (current) use of anticoagulants: Secondary | ICD-10-CM

## 2011-01-20 LAB — PACEMAKER DEVICE OBSERVATION
BMOD-0002RV: 12
BRDY-0002RV: 60 {beats}/min
BRDY-0004RV: 110 {beats}/min
DEVICE MODEL PM: 2667494
RV LEAD AMPLITUDE: 12 mv
VENTRICULAR PACING PM: 99

## 2011-01-20 LAB — POCT INR: INR: 2.4

## 2011-01-20 NOTE — Progress Notes (Signed)
The patient presents today for routine electrophysiology followup.  Since having his pacemaker implanted, the patient reports doing very well.  His energy has significantly improved and he is doing well.  Today, he denies symptoms of palpitations, chest pain, shortness of breath, dizziness, presyncope, syncope, or neurologic sequela.  The patient feels that he is tolerating medications without difficulties and is otherwise without complaint today.   Past Medical History  Diagnosis Date  . COPD (chronic obstructive pulmonary disease)   . Hyperlipidemia   . CAD (coronary artery disease)     by cath 1/11, medical management advised  . Cardiomyopathy, ischemic     EF 40% by echo 1/11  . CVD (cardiovascular disease)     s/p MCA stroke 1/11  . HTN (hypertension)   . Seizure disorder   . Permanent atrial fibrillation   . Complete heart block     s/p SJM PPM by JSA 01/10/11   Past Surgical History  Procedure Date  . Pacemaker insertion 01/10/11    by JA for CHB    Current Outpatient Prescriptions  Medication Sig Dispense Refill  . aspirin 81 MG tablet Take 81 mg by mouth daily.        . carvedilol (COREG) 6.25 MG tablet Take 3.125 mg by mouth 2 (two) times daily.       . Cholecalciferol (VITAMIN D) 2000 UNITS CAPS Take 1 capsule by mouth daily.        . furosemide (LASIX) 40 MG tablet Take 1/2 tablet by mouth every other day       . isosorbide mononitrate (IMDUR) 30 MG 24 hr tablet Take 30 mg by mouth daily.        Marland Kitchen levETIRAcetam (KEPPRA) 250 MG tablet Take 250 mg by mouth 2 (two) times daily.        Marland Kitchen lisinopril (PRINIVIL,ZESTRIL) 2.5 MG tablet Take 1 tablet (2.5 mg total) by mouth daily.  30 tablet  11  . NITROSTAT 0.4 MG SL tablet as needed.      . potassium chloride SA (K-DUR,KLOR-CON) 20 MEQ tablet Take 20 mEq by mouth daily.        . simvastatin (ZOCOR) 40 MG tablet Take 40 mg by mouth at bedtime.        Marland Kitchen warfarin (COUMADIN) 2.5 MG tablet Take by mouth as directed.           History   Social History  . Marital Status: Single    Spouse Name: N/A    Number of Children: N/A  . Years of Education: N/A   Occupational History  . Retired    Social History Main Topics  . Smoking status: Former Games developer  . Smokeless tobacco: Not on file   Comment: quit 40 years ago  . Alcohol Use: No  . Drug Use: No  . Sexually Active: Not on file   Other Topics Concern  . Not on file   Social History Narrative   Lives in [place] with family.    Physical Exam: Filed Vitals:   01/20/11 0911  BP: 111/72  Pulse: 72  Resp: 14  Weight: 181 lb (82.101 kg)    GEN- The patient is well appearing, alert and oriented x 3 today.   Head- normocephalic, atraumatic Eyes-  Sclera clear, conjunctiva pink Ears- hearing intact Oropharynx- clear Neck- supple, no JVP Lymph- no cervical lymphadenopathy Lungs- Clear to ausculation bilaterally, normal work of breathing Chest- pacemaker pocket is healing nicely Heart- Regular rate and rhythm (paced),  GI- soft, NT, ND, + BS Extremities- no clubbing, cyanosis, or edema  Pacemaker interrogation- reviewed in detail today,  See PACEART report  Assessment and Plan:

## 2011-01-20 NOTE — Assessment & Plan Note (Signed)
Rate controlled permanent afib Goal INR 2-3

## 2011-01-20 NOTE — Assessment & Plan Note (Signed)
Doing well s/p PPM Pocket is healing uneventfully  Normal pacemaker function See Pace Art report No changes today

## 2011-01-20 NOTE — Patient Instructions (Signed)
Your physician recommends that you schedule a follow-up appointment in 3 months with Dr Allred    

## 2011-01-21 ENCOUNTER — Encounter: Payer: Self-pay | Admitting: *Deleted

## 2011-01-29 ENCOUNTER — Other Ambulatory Visit: Payer: Self-pay | Admitting: Internal Medicine

## 2011-02-13 NOTE — Op Note (Signed)
  NAMEJERMIE, HIPPE                ACCOUNT NO.:  [id number]  MEDICAL RECORD NO.:  [id number]  LOCATION:  3739                         FACILITY:  [facility]  PHYSICIAN:  Hillis Range, MD       DATE OF BIRTH:  08-Dec-1927  DATE OF PROCEDURE:  01/10/2011 DATE OF DISCHARGE:  01/11/2011                              OPERATIVE REPORT   PREPROCEDURE DIAGNOSES: 1. Permanent atrial fibrillation. 2. Complete heart block.  POSTPROCEDURE DIAGNOSES: 1. Permanent atrial fibrillation. 2. Complete heart block.  PROCEDURES:  Pacemaker implantation.  INTRODUCTION:  Mr. Maher is a very pleasant elderly gentleman with a history of permanent atrial fibrillation.  He now presents with symptomatic complete heart block.  No reversible causes were found.  He therefore presents for pacemaker implantation.  DESCRIPTION OF PROCEDURE:  Informed written consent was obtained and the patient was brought to the electrophysiology lab in the fasting state. He was adequately sedated with intravenous medications as outlined in the nursing report.  The patient's left chest was prepped and draped in the usual sterile fashion by the EP lab staff.  The skin overlying the left deltopectoral region was infiltrated with lidocaine for local analgesia.  A 4-cm incision was made over the left deltopectoral region. A left subcutaneous pacemaker pocket was fashioned using a combination of sharp and blunt dissection.  Electrocautery was required to assure hemostasis.  The left axillary vein was cannulated with fluoroscopic visualization.  Through the left axillary vein, a St. Jude Medical Isoflex model [phone] (serial number [id number]) right ventricular lead was advanced with fluoroscopic visualization into the right ventricular apex position.  Adequate sensing and pacing were demonstrated.  The lead was secured to the pectoralis fascia using #2 silk suture over the suture sleeve.  The lead was then connected to a Psychiatrist SR RF, model 1210 (serial number [id number]) pacemaker.  The pocket was irrigated with copious gentamicin solution.  The pacemaker was then placed into the pocket.  The pocket was then closed in 2 layers with 2.0 Vicryl suture for the subcutaneous and subcuticular layers.  Steri- Strips and a sterile dressing were then applied.  There were no early apparent complications.  CONCLUSIONS: 1. Successful implantation of a St. Jude Medical Accent SR pacemaker     for complete heart block and permanent atrial fibrillation. 2. No early apparent complications.     Hillis Range, MD     JA/MEDQ  D:  01/20/2011  T:  01/20/2011  Job:  [id number]  Electronically Signed by Hillis Range MD on 02/13/2011 08:39:56 AM

## 2011-02-17 ENCOUNTER — Ambulatory Visit (INDEPENDENT_AMBULATORY_CARE_PROVIDER_SITE_OTHER): Payer: Medicare Other | Admitting: *Deleted

## 2011-02-17 DIAGNOSIS — Z7901 Long term (current) use of anticoagulants: Secondary | ICD-10-CM

## 2011-02-17 DIAGNOSIS — I4891 Unspecified atrial fibrillation: Secondary | ICD-10-CM

## 2011-02-20 NOTE — Discharge Summary (Signed)
NAMELEELAN, RAJEWSKI                ACCOUNT NO.:  [id number]  MEDICAL RECORD NO.:  [id number]  LOCATION:  3739                         FACILITY:  [facility]  PHYSICIAN:  Rollene Rotunda, MD, [facility]  DATE OF BIRTH:  03-26-1928  DATE OF ADMISSION:  01/09/2011 DATE OF DISCHARGE:  01/11/2011                              DISCHARGE SUMMARY   PRIMARY CARDIOLOGIST:  Hillis Range, MD  DISCHARGE DIAGNOSIS:  Permanent atrial fibrillation with profound bradycardia/atrioventricular block. a.  Status post successful St. Jude Medical Accent single chamber permanent pacemaker implantation.  SECONDARY DIAGNOSES: 1. Permanent atrial fibrillation. 2. History of atrial flutter. 3. Ischemic cardiomyopathy.     a.     EF 40%, by 2-D echo, January 2011. 4. Stroke.     a.     Status post left MCA infarction, January 2011. 5. Hypertension. 6. Hyperlipidemia. 7. Chronic Coumadin. 8. Chronic obstructive pulmonary disease.  REASON FOR ADMISSION:  Mr. Daniel Brown is an 77 year old male, with complex medical history, who presented to Dr. Tonny Bollman with symptoms of lightheadedness/near syncope, in the setting of marked bradycardia with a heart rate of approximately 30 beats per minute.  He was admitted directly to [facility], for further evaluation and probable pacemaker implantation.  HOSPITAL COURSE:  The patient was referred to Dr. Hillis Range in consultation, who recommended proceeding with permanent pacemaker implantation, also noting that the patient would require beta-blocker long-term.  The patient underwent successful placement of the device on hospital day #1, was kept for overnight observation, and cleared for discharge the following morning, in hemodynamically stable condition.  Dr. Johney Frame suggested that the mild increase in cardiac markers was most likely due to the ventricular rate remaining in the 20 bpm range, for several days.  He did not recommend further ischemic workup, unless  the patient were to become symptomatic.  On day of scheduled discharge, Pharmacology was consulted regarding Coumadin dosing, given that this had been placed on hold.  His INR was 1.6 on morning of discharge.  Recommendation was to give 7.5 mg today, 5 mg tomorrow, and then resume previous home dosing regimen.  Dr. Antoine Poche also recommended that a ProTime be checked on Tuesday, January 14, 2011, in our [facility].  DISPOSITION:  Stable.  FOLLOWUP:  Dr. Hillis Range in 3 months, and Device Clinic in 10 days for wound check, with arrangements to be made through our office.  DISCHARGE LABORATORY DATA:  INR 1.6.  Potassium 4.4, BUN 20, and  creatinine 1.0.    OUTSTANDING LABS:  Peak CPK 438/14 (3.2%); troponin I less than 0.3. Lipid profile:  Total cholesterol 116, triglycerides 67, HDL 42, and  LDL 61.  TSH 3.1.  Potassium 6.2, BUN 26, creatinine 1.0 on admission.   WBC 7.7, hemoglobin 16, hematocrit 46 and platelet 131 on admission.  POSTPROCEDURE CHEST X-RAY:  No pneumothorax or congestive heart failure.DISCHARGE MEDICATIONS: 1. Coumadin 2.5 mg as directed. 2. Aspirin 81 daily. 3. Carvedilol 3.125 mg b.i.d. 4. Furosemide 20 mg every other day. 5. Imdur 30 mg daily. 6. Levetiracetam 250 mg b.i.d. 7. Lisinopril 2.5 mg daily. 8. K-Dur 20 mEq daily. 9. Simvastatin 40 mg daily. 10.Nitrostat 0.4 mg p.r.n.  DURATION OF DISCHARGE ENCOUNTER:  Greater than 30 minutes, including physician time.     Gene Serpe, PA-C   ______________________________ Rollene Rotunda, MD, [facility]    GS/MEDQ  D:  01/11/2011  T:  01/11/2011  Job:  [id number]  Electronically Signed by Rozell Searing PA-C on 01/14/2011 01:54:13 PM Electronically Signed by Rollene Rotunda MD [facility] on 02/20/2011 01:33:49 PM

## 2011-03-17 ENCOUNTER — Ambulatory Visit (INDEPENDENT_AMBULATORY_CARE_PROVIDER_SITE_OTHER): Payer: Medicare Other | Admitting: *Deleted

## 2011-03-17 DIAGNOSIS — I4891 Unspecified atrial fibrillation: Secondary | ICD-10-CM

## 2011-03-17 DIAGNOSIS — Z7901 Long term (current) use of anticoagulants: Secondary | ICD-10-CM

## 2011-03-17 LAB — POCT INR: INR: 2.9

## 2011-04-25 ENCOUNTER — Ambulatory Visit (INDEPENDENT_AMBULATORY_CARE_PROVIDER_SITE_OTHER): Payer: Medicare Other | Admitting: Internal Medicine

## 2011-04-25 ENCOUNTER — Ambulatory Visit (INDEPENDENT_AMBULATORY_CARE_PROVIDER_SITE_OTHER): Payer: Medicare Other | Admitting: *Deleted

## 2011-04-25 ENCOUNTER — Encounter: Payer: Self-pay | Admitting: Internal Medicine

## 2011-04-25 DIAGNOSIS — Z7901 Long term (current) use of anticoagulants: Secondary | ICD-10-CM

## 2011-04-25 DIAGNOSIS — I2510 Atherosclerotic heart disease of native coronary artery without angina pectoris: Secondary | ICD-10-CM

## 2011-04-25 DIAGNOSIS — I10 Essential (primary) hypertension: Secondary | ICD-10-CM

## 2011-04-25 DIAGNOSIS — I4891 Unspecified atrial fibrillation: Secondary | ICD-10-CM

## 2011-04-25 DIAGNOSIS — I442 Atrioventricular block, complete: Secondary | ICD-10-CM

## 2011-04-25 LAB — PACEMAKER DEVICE OBSERVATION
BRDY-0002RV: 60 {beats}/min
BRDY-0005RV: 50 {beats}/min
DEVICE MODEL PM: 2667494
RV LEAD IMPEDENCE PM: 675 Ohm
RV LEAD THRESHOLD: 0.625 V

## 2011-04-25 NOTE — Progress Notes (Signed)
The patient presents today for routine electrophysiology followup.  Since his last visit, the patient reports doing very well.  His energy has significantly improved and he is doing well.  Today, he denies symptoms of palpitations, chest pain, shortness of breath, dizziness, presyncope, syncope, or neurologic sequela.  The patient feels that he is tolerating medications without difficulties and is otherwise without complaint today.   Past Medical History  Diagnosis Date  . COPD (chronic obstructive pulmonary disease)   . Hyperlipidemia   . CAD (coronary artery disease)     by cath 1/11, medical management advised  . Cardiomyopathy, ischemic     EF 40% by echo 1/11  . CVD (cardiovascular disease)     s/p MCA stroke 1/11  . HTN (hypertension)   . Seizure disorder   . Permanent atrial fibrillation   . Complete heart block     s/p SJM PPM by JSA 01/10/11   Past Surgical History  Procedure Date  . Pacemaker insertion 01/10/11    by JA for CHB    Current Outpatient Prescriptions  Medication Sig Dispense Refill  . aspirin 81 MG tablet Take 81 mg by mouth daily.        . carvedilol (COREG) 6.25 MG tablet Take 3.125 mg by mouth 2 (two) times daily.       . Cholecalciferol (VITAMIN D) 2000 UNITS CAPS Take 1 capsule by mouth daily.        . furosemide (LASIX) 40 MG tablet Take 1/2 tablet by mouth every other day       . isosorbide mononitrate (IMDUR) 30 MG 24 hr tablet Take 30 mg by mouth daily.        Marland Kitchen levETIRAcetam (KEPPRA) 250 MG tablet Take 250 mg by mouth 2 (two) times daily.        Marland Kitchen lisinopril (PRINIVIL,ZESTRIL) 2.5 MG tablet Take 1 tablet (2.5 mg total) by mouth daily.  30 tablet  11  . NITROSTAT 0.4 MG SL tablet as needed.      . potassium chloride SA (K-DUR,KLOR-CON) 20 MEQ tablet Take 20 mEq by mouth daily.        . simvastatin (ZOCOR) 40 MG tablet Take 40 mg by mouth at bedtime.        Marland Kitchen warfarin (COUMADIN) 2.5 MG tablet TAKE AS DIRECTED BY ANTICOAGUALATION CLINIC  45 tablet  3     History   Social History  . Marital Status: Single    Spouse Name: N/A    Number of Children: N/A  . Years of Education: N/A   Occupational History  . Retired    Social History Main Topics  . Smoking status: Former Games developer  . Smokeless tobacco: Not on file   Comment: quit 40 years ago  . Alcohol Use: No  . Drug Use: No  . Sexually Active: Not on file   Other Topics Concern  . Not on file   Social History Narrative   Lives in [place] with family.    Physical Exam: Filed Vitals:   04/25/11 0917  BP: 92/54  Pulse: 72  Height: 5\' 7"  (1.702 m)  Weight: 180 lb (81.647 kg)    GEN- The patient is well appearing, alert and oriented x 3 today.   Head- normocephalic, atraumatic Eyes-  Sclera clear, conjunctiva pink Ears- hearing intact Oropharynx- clear Neck- supple, no JVP Lymph- no cervical lymphadenopathy Lungs- Clear to ausculation bilaterally, normal work of breathing Chest- pacemaker pocket is healing nicely Heart- Regular rate and rhythm (  paced),  GI- soft, NT, ND, + BS Extremities- no clubbing, cyanosis, or edema  Pacemaker interrogation- reviewed in detail today,  See PACEART report  Assessment and Plan:

## 2011-04-25 NOTE — Assessment & Plan Note (Signed)
Stable No change required today  

## 2011-04-25 NOTE — Assessment & Plan Note (Signed)
Normal pacemaker function See Pace Art report No changes today  

## 2011-04-25 NOTE — Assessment & Plan Note (Signed)
Permanent afib is stable Goal INR 2-3

## 2011-04-25 NOTE — Patient Instructions (Signed)
Your physician wants you to follow-up in: 4 months with Dr Allred You will receive a reminder letter in the mail two months in advance. If you don't receive a letter, please call our office to schedule the follow-up appointment.  

## 2011-06-02 ENCOUNTER — Encounter: Payer: Medicare Other | Admitting: *Deleted

## 2011-06-02 ENCOUNTER — Other Ambulatory Visit: Payer: Self-pay

## 2011-06-03 ENCOUNTER — Other Ambulatory Visit: Payer: Self-pay | Admitting: *Deleted

## 2011-06-03 MED ORDER — WARFARIN SODIUM 2.5 MG PO TABS: 2.5000 mg | ORAL_TABLET | ORAL | Status: DC

## 2011-06-05 ENCOUNTER — Ambulatory Visit (INDEPENDENT_AMBULATORY_CARE_PROVIDER_SITE_OTHER): Payer: Medicare Other | Admitting: *Deleted

## 2011-06-05 DIAGNOSIS — I4891 Unspecified atrial fibrillation: Secondary | ICD-10-CM

## 2011-06-05 DIAGNOSIS — Z7901 Long term (current) use of anticoagulants: Secondary | ICD-10-CM

## 2011-07-07 ENCOUNTER — Other Ambulatory Visit: Payer: Self-pay | Admitting: *Deleted

## 2011-07-07 MED ORDER — WARFARIN SODIUM 2.5 MG PO TABS: 2.5000 mg | ORAL_TABLET | ORAL | Status: DC

## 2011-07-16 ENCOUNTER — Ambulatory Visit (INDEPENDENT_AMBULATORY_CARE_PROVIDER_SITE_OTHER): Payer: Medicare Other | Admitting: *Deleted

## 2011-07-16 DIAGNOSIS — I4891 Unspecified atrial fibrillation: Secondary | ICD-10-CM

## 2011-07-16 DIAGNOSIS — Z7901 Long term (current) use of anticoagulants: Secondary | ICD-10-CM

## 2011-07-16 LAB — POCT INR: INR: 3

## 2011-08-18 ENCOUNTER — Ambulatory Visit (INDEPENDENT_AMBULATORY_CARE_PROVIDER_SITE_OTHER): Payer: Medicare Other | Admitting: Internal Medicine

## 2011-08-18 ENCOUNTER — Encounter: Payer: Self-pay | Admitting: Internal Medicine

## 2011-08-18 ENCOUNTER — Ambulatory Visit (INDEPENDENT_AMBULATORY_CARE_PROVIDER_SITE_OTHER): Payer: Medicare Other | Admitting: Pharmacist

## 2011-08-18 VITALS — BP 113/82 | HR 71 | Resp 18 | Ht 66.0 in | Wt 181.8 lb

## 2011-08-18 DIAGNOSIS — Z7901 Long term (current) use of anticoagulants: Secondary | ICD-10-CM

## 2011-08-18 DIAGNOSIS — I442 Atrioventricular block, complete: Secondary | ICD-10-CM

## 2011-08-18 DIAGNOSIS — I4891 Unspecified atrial fibrillation: Secondary | ICD-10-CM

## 2011-08-18 LAB — PACEMAKER DEVICE OBSERVATION
BMOD-0002RV: 12
BRDY-0002RV: 60 {beats}/min
BRDY-0004RV: 110 {beats}/min
BRDY-0005RV: 50 {beats}/min
RV LEAD THRESHOLD: 0.875 V
VENTRICULAR PACING PM: 96

## 2011-08-18 LAB — POCT INR: INR: 3.6

## 2011-08-18 NOTE — Assessment & Plan Note (Signed)
Normal pacemaker function See Pace Art report No changes today  Continue merlin checks

## 2011-08-18 NOTE — Patient Instructions (Addendum)
Remote monitoring is used to monitor your Pacemaker of ICD from home. This monitoring reduces the number of office visits required to check your device to one time per year. It allows [place] to keep an eye on the functioning of your device to ensure it is working properly. You are scheduled for a device check from home on November 20, 2011. You may send your transmission at any time that day. If you have a wireless device, the transmission will be sent automatically. After your physician reviews your transmission, you will receive a postcard with your next transmission date.  Your physician wants you to follow-up in: 6 months with Dr Jacquiline Doe will receive a reminder letter in the mail two months in advance. If you don't receive a letter, please call our office to schedule the follow-up appointment.

## 2011-08-18 NOTE — Assessment & Plan Note (Signed)
INRs are recently labile He declines pradaxa/xarelto Continue close follow-up in the coumadin clinic

## 2011-08-18 NOTE — Progress Notes (Signed)
The patient presents today for routine electrophysiology followup.  Since his last visit, the patient reports doing very well.  He is looking forward to baseball season. Today, he denies symptoms of palpitations, chest pain, shortness of breath, dizziness, presyncope, syncope, or neurologic sequela.  The patient feels that he is tolerating medications without difficulties and is otherwise without complaint today.   Past Medical History  Diagnosis Date  . COPD (chronic obstructive pulmonary disease)   . Hyperlipidemia   . CAD (coronary artery disease)     by cath 1/11, medical management advised  . Cardiomyopathy, ischemic     EF 40% by echo 1/11  . CVD (cardiovascular disease)     s/p MCA stroke 1/11  . HTN (hypertension)   . Seizure disorder   . Permanent atrial fibrillation   . Complete heart block     s/p SJM PPM by JSA 01/10/11   Past Surgical History  Procedure Date  . Pacemaker insertion 01/10/11    by JA for CHB    Current Outpatient Prescriptions  Medication Sig Dispense Refill  . aspirin 81 MG tablet Take 81 mg by mouth daily.        . carvedilol (COREG) 6.25 MG tablet Take 3.125 mg by mouth 2 (two) times daily.       . Cholecalciferol (VITAMIN D) 2000 UNITS CAPS Take 1 capsule by mouth daily.        . furosemide (LASIX) 40 MG tablet Take 1/2 tablet by mouth every other day       . isosorbide mononitrate (IMDUR) 30 MG 24 hr tablet Take 30 mg by mouth daily.        Marland Kitchen levETIRAcetam (KEPPRA) 250 MG tablet Take 250 mg by mouth 2 (two) times daily.        Marland Kitchen lisinopril (PRINIVIL,ZESTRIL) 2.5 MG tablet Take 1 tablet (2.5 mg total) by mouth daily.  30 tablet  11  . NITROSTAT 0.4 MG SL tablet as needed.      . potassium chloride SA (K-DUR,KLOR-CON) 20 MEQ tablet Take 20 mEq by mouth daily.        . simvastatin (ZOCOR) 40 MG tablet Take 40 mg by mouth at bedtime.        Marland Kitchen warfarin (COUMADIN) 2.5 MG tablet Take 1 tablet (2.5 mg total) by mouth as directed.  45 tablet  3    History     Social History  . Marital Status: Single    Spouse Name: N/A    Number of Children: N/A  . Years of Education: N/A   Occupational History  . Retired    Social History Main Topics  . Smoking status: Former Games developer  . Smokeless tobacco: Not on file   Comment: quit 40 years ago  . Alcohol Use: No  . Drug Use: No  . Sexually Active: Not on file   Other Topics Concern  . Not on file   Social History Narrative   Lives in [place] with family.    Physical Exam: Filed Vitals:   08/18/11 1011  BP: 113/82  Pulse: 71  Resp: 18  Height: 5\' 6"  (1.676 m)  Weight: 181 lb 12.8 oz (82.464 kg)    GEN- The patient is well appearing, alert and oriented x 3 today.   Head- normocephalic, atraumatic Eyes-  Sclera clear, conjunctiva pink Ears- hearing intact Oropharynx- clear Neck- supple, no JVP Lymph- no cervical lymphadenopathy Lungs- Clear to ausculation bilaterally, normal work of breathing Chest- pacemaker pocket is healing  nicely Heart- Regular rate and rhythm (paced),  GI- soft, NT, ND, + BS Extremities- no clubbing, cyanosis, or edema  Pacemaker interrogation- reviewed in detail today,  See PACEART report  Assessment and Plan:

## 2011-08-19 ENCOUNTER — Encounter: Payer: Self-pay | Admitting: Internal Medicine

## 2011-09-08 ENCOUNTER — Ambulatory Visit (INDEPENDENT_AMBULATORY_CARE_PROVIDER_SITE_OTHER): Payer: Medicare Other | Admitting: *Deleted

## 2011-09-08 DIAGNOSIS — I4891 Unspecified atrial fibrillation: Secondary | ICD-10-CM

## 2011-09-08 DIAGNOSIS — Z7901 Long term (current) use of anticoagulants: Secondary | ICD-10-CM

## 2011-09-08 LAB — POCT INR: INR: 2.3

## 2011-09-24 ENCOUNTER — Encounter: Payer: Self-pay | Admitting: Internal Medicine

## 2011-10-08 ENCOUNTER — Ambulatory Visit (INDEPENDENT_AMBULATORY_CARE_PROVIDER_SITE_OTHER): Payer: Medicare Other | Admitting: *Deleted

## 2011-10-08 DIAGNOSIS — Z7901 Long term (current) use of anticoagulants: Secondary | ICD-10-CM

## 2011-10-08 DIAGNOSIS — I4891 Unspecified atrial fibrillation: Secondary | ICD-10-CM

## 2011-10-10 ENCOUNTER — Other Ambulatory Visit: Payer: Self-pay | Admitting: Cardiology

## 2011-10-10 MED ORDER — LISINOPRIL 2.5 MG PO TABS: 2.5000 mg | ORAL_TABLET | Freq: Every day | ORAL | Status: DC

## 2011-11-12 ENCOUNTER — Ambulatory Visit (INDEPENDENT_AMBULATORY_CARE_PROVIDER_SITE_OTHER): Payer: Medicare Other | Admitting: *Deleted

## 2011-11-12 DIAGNOSIS — Z7901 Long term (current) use of anticoagulants: Secondary | ICD-10-CM

## 2011-11-12 DIAGNOSIS — I4891 Unspecified atrial fibrillation: Secondary | ICD-10-CM

## 2011-11-12 LAB — POCT INR: INR: 2.4

## 2011-11-17 ENCOUNTER — Other Ambulatory Visit: Payer: Self-pay | Admitting: *Deleted

## 2011-11-17 ENCOUNTER — Other Ambulatory Visit: Payer: Self-pay | Admitting: Internal Medicine

## 2011-11-17 MED ORDER — WARFARIN SODIUM 2.5 MG PO TABS: ORAL_TABLET | ORAL | Status: DC

## 2011-11-17 NOTE — Telephone Encounter (Signed)
Only few pills left

## 2011-11-20 ENCOUNTER — Ambulatory Visit (INDEPENDENT_AMBULATORY_CARE_PROVIDER_SITE_OTHER): Payer: Medicare Other | Admitting: *Deleted

## 2011-11-20 ENCOUNTER — Encounter: Payer: Self-pay | Admitting: Internal Medicine

## 2011-11-20 DIAGNOSIS — I442 Atrioventricular block, complete: Secondary | ICD-10-CM

## 2011-11-21 LAB — REMOTE PACEMAKER DEVICE
BMOD-0002RV: 12
BRDY-0004RV: 110 {beats}/min
RV LEAD AMPLITUDE: 12 mv
RV LEAD THRESHOLD: 0.625 V
VENTRICULAR PACING PM: 98

## 2011-12-08 ENCOUNTER — Encounter: Payer: Self-pay | Admitting: *Deleted

## 2011-12-24 ENCOUNTER — Ambulatory Visit (INDEPENDENT_AMBULATORY_CARE_PROVIDER_SITE_OTHER): Payer: Medicare Other | Admitting: *Deleted

## 2011-12-24 DIAGNOSIS — I4891 Unspecified atrial fibrillation: Secondary | ICD-10-CM

## 2011-12-24 DIAGNOSIS — Z7901 Long term (current) use of anticoagulants: Secondary | ICD-10-CM

## 2011-12-24 LAB — POCT INR: INR: 2.2

## 2012-02-04 ENCOUNTER — Ambulatory Visit (INDEPENDENT_AMBULATORY_CARE_PROVIDER_SITE_OTHER): Payer: Medicare Other | Admitting: *Deleted

## 2012-02-04 DIAGNOSIS — I4891 Unspecified atrial fibrillation: Secondary | ICD-10-CM

## 2012-02-04 DIAGNOSIS — Z7901 Long term (current) use of anticoagulants: Secondary | ICD-10-CM

## 2012-02-23 ENCOUNTER — Ambulatory Visit (INDEPENDENT_AMBULATORY_CARE_PROVIDER_SITE_OTHER): Payer: Medicare Other | Admitting: *Deleted

## 2012-02-23 DIAGNOSIS — Z950 Presence of cardiac pacemaker: Secondary | ICD-10-CM

## 2012-02-23 DIAGNOSIS — I442 Atrioventricular block, complete: Secondary | ICD-10-CM

## 2012-02-24 ENCOUNTER — Encounter: Payer: Self-pay | Admitting: *Deleted

## 2012-02-24 DIAGNOSIS — Z950 Presence of cardiac pacemaker: Secondary | ICD-10-CM | POA: Insufficient documentation

## 2012-02-24 LAB — REMOTE PACEMAKER DEVICE
BMOD-0002RV: 12
BRDY-0002RV: 60 {beats}/min
BRDY-0005RV: 50 {beats}/min
DEVICE MODEL PM: 2667494
VENTRICULAR PACING PM: 98

## 2012-02-26 ENCOUNTER — Encounter: Payer: Self-pay | Admitting: *Deleted

## 2012-03-09 ENCOUNTER — Encounter: Payer: Self-pay | Admitting: Internal Medicine

## 2012-03-17 ENCOUNTER — Other Ambulatory Visit: Payer: Self-pay | Admitting: *Deleted

## 2012-03-17 ENCOUNTER — Ambulatory Visit (INDEPENDENT_AMBULATORY_CARE_PROVIDER_SITE_OTHER): Payer: Medicare Other | Admitting: *Deleted

## 2012-03-17 DIAGNOSIS — I4891 Unspecified atrial fibrillation: Secondary | ICD-10-CM

## 2012-03-17 DIAGNOSIS — Z7901 Long term (current) use of anticoagulants: Secondary | ICD-10-CM

## 2012-03-17 MED ORDER — WARFARIN SODIUM 2.5 MG PO TABS: ORAL_TABLET | ORAL | Status: DC

## 2012-04-28 ENCOUNTER — Ambulatory Visit (INDEPENDENT_AMBULATORY_CARE_PROVIDER_SITE_OTHER): Payer: Medicare Other | Admitting: *Deleted

## 2012-04-28 DIAGNOSIS — Z7901 Long term (current) use of anticoagulants: Secondary | ICD-10-CM

## 2012-04-28 DIAGNOSIS — I4891 Unspecified atrial fibrillation: Secondary | ICD-10-CM

## 2012-05-26 ENCOUNTER — Ambulatory Visit (INDEPENDENT_AMBULATORY_CARE_PROVIDER_SITE_OTHER): Payer: Medicare Other | Admitting: *Deleted

## 2012-05-26 DIAGNOSIS — Z7901 Long term (current) use of anticoagulants: Secondary | ICD-10-CM

## 2012-05-26 DIAGNOSIS — I4891 Unspecified atrial fibrillation: Secondary | ICD-10-CM

## 2012-05-31 ENCOUNTER — Encounter: Payer: Self-pay | Admitting: Internal Medicine

## 2012-05-31 ENCOUNTER — Ambulatory Visit (INDEPENDENT_AMBULATORY_CARE_PROVIDER_SITE_OTHER): Payer: Medicare Other | Admitting: *Deleted

## 2012-05-31 DIAGNOSIS — Z950 Presence of cardiac pacemaker: Secondary | ICD-10-CM

## 2012-05-31 DIAGNOSIS — I442 Atrioventricular block, complete: Secondary | ICD-10-CM

## 2012-05-31 LAB — REMOTE PACEMAKER DEVICE
BATTERY VOLTAGE: 2.99 V
BMOD-0002RV: 12
BRDY-0004RV: 110 {beats}/min
BRDY-0005RV: 50 {beats}/min
DEVICE MODEL PM: 2667494
VENTRICULAR PACING PM: 94

## 2012-06-08 ENCOUNTER — Encounter: Payer: Self-pay | Admitting: *Deleted

## 2012-06-23 ENCOUNTER — Ambulatory Visit (INDEPENDENT_AMBULATORY_CARE_PROVIDER_SITE_OTHER): Payer: Medicare Other | Admitting: *Deleted

## 2012-06-23 DIAGNOSIS — I4891 Unspecified atrial fibrillation: Secondary | ICD-10-CM

## 2012-06-23 DIAGNOSIS — Z7901 Long term (current) use of anticoagulants: Secondary | ICD-10-CM

## 2012-07-13 ENCOUNTER — Other Ambulatory Visit: Payer: Self-pay | Admitting: Emergency Medicine

## 2012-07-14 ENCOUNTER — Other Ambulatory Visit: Payer: Self-pay | Admitting: Cardiology

## 2012-07-14 MED ORDER — WARFARIN SODIUM 2.5 MG PO TABS: ORAL_TABLET | ORAL | Status: DC

## 2012-07-21 ENCOUNTER — Ambulatory Visit (INDEPENDENT_AMBULATORY_CARE_PROVIDER_SITE_OTHER): Payer: Medicare Other | Admitting: *Deleted

## 2012-07-21 DIAGNOSIS — Z7901 Long term (current) use of anticoagulants: Secondary | ICD-10-CM

## 2012-07-21 LAB — POCT INR: INR: 2.4

## 2012-09-02 ENCOUNTER — Ambulatory Visit (INDEPENDENT_AMBULATORY_CARE_PROVIDER_SITE_OTHER): Payer: Medicare Other | Admitting: *Deleted

## 2012-09-02 ENCOUNTER — Encounter: Payer: Self-pay | Admitting: Internal Medicine

## 2012-09-02 ENCOUNTER — Ambulatory Visit (INDEPENDENT_AMBULATORY_CARE_PROVIDER_SITE_OTHER): Payer: Medicare Other

## 2012-09-02 ENCOUNTER — Other Ambulatory Visit: Payer: Self-pay | Admitting: Internal Medicine

## 2012-09-02 DIAGNOSIS — Z7901 Long term (current) use of anticoagulants: Secondary | ICD-10-CM

## 2012-09-02 DIAGNOSIS — I442 Atrioventricular block, complete: Secondary | ICD-10-CM

## 2012-09-02 DIAGNOSIS — I4891 Unspecified atrial fibrillation: Secondary | ICD-10-CM

## 2012-09-02 DIAGNOSIS — I519 Heart disease, unspecified: Secondary | ICD-10-CM

## 2012-09-02 LAB — PACEMAKER DEVICE OBSERVATION
BATTERY VOLTAGE: 2.9779 V
BRDY-0002RV: 60 {beats}/min
RV LEAD AMPLITUDE: 12 mv
RV LEAD IMPEDENCE PM: 600 Ohm

## 2012-09-02 NOTE — Progress Notes (Signed)
PPM check 

## 2012-09-13 ENCOUNTER — Emergency Department (HOSPITAL_COMMUNITY)
Admission: EM | Admit: 2012-09-13 | Discharge: 2012-09-13 | Disposition: A | Discharge status: Home / Self Care | Payer: Medicare Other | Attending: Emergency Medicine | Admitting: Emergency Medicine

## 2012-09-13 ENCOUNTER — Emergency Department (HOSPITAL_COMMUNITY): Payer: Medicare Other

## 2012-09-13 ENCOUNTER — Encounter (HOSPITAL_COMMUNITY): Payer: Self-pay | Admitting: Nurse Practitioner

## 2012-09-13 DIAGNOSIS — R531 Weakness: Secondary | ICD-10-CM

## 2012-09-13 DIAGNOSIS — J4489 Other specified chronic obstructive pulmonary disease: Secondary | ICD-10-CM | POA: Insufficient documentation

## 2012-09-13 DIAGNOSIS — Z950 Presence of cardiac pacemaker: Secondary | ICD-10-CM | POA: Insufficient documentation

## 2012-09-13 DIAGNOSIS — Z87891 Personal history of nicotine dependence: Secondary | ICD-10-CM | POA: Insufficient documentation

## 2012-09-13 DIAGNOSIS — R5381 Other malaise: Secondary | ICD-10-CM | POA: Insufficient documentation

## 2012-09-13 DIAGNOSIS — J449 Chronic obstructive pulmonary disease, unspecified: Secondary | ICD-10-CM | POA: Insufficient documentation

## 2012-09-13 DIAGNOSIS — Z8679 Personal history of other diseases of the circulatory system: Secondary | ICD-10-CM | POA: Insufficient documentation

## 2012-09-13 DIAGNOSIS — R110 Nausea: Secondary | ICD-10-CM | POA: Insufficient documentation

## 2012-09-13 DIAGNOSIS — I4891 Unspecified atrial fibrillation: Secondary | ICD-10-CM | POA: Insufficient documentation

## 2012-09-13 DIAGNOSIS — I2510 Atherosclerotic heart disease of native coronary artery without angina pectoris: Secondary | ICD-10-CM | POA: Insufficient documentation

## 2012-09-13 DIAGNOSIS — Z8673 Personal history of transient ischemic attack (TIA), and cerebral infarction without residual deficits: Secondary | ICD-10-CM | POA: Insufficient documentation

## 2012-09-13 DIAGNOSIS — E785 Hyperlipidemia, unspecified: Secondary | ICD-10-CM | POA: Insufficient documentation

## 2012-09-13 DIAGNOSIS — Z79899 Other long term (current) drug therapy: Secondary | ICD-10-CM | POA: Insufficient documentation

## 2012-09-13 DIAGNOSIS — Z7982 Long term (current) use of aspirin: Secondary | ICD-10-CM | POA: Insufficient documentation

## 2012-09-13 DIAGNOSIS — G40909 Epilepsy, unspecified, not intractable, without status epilepticus: Secondary | ICD-10-CM | POA: Insufficient documentation

## 2012-09-13 DIAGNOSIS — Z7901 Long term (current) use of anticoagulants: Secondary | ICD-10-CM | POA: Insufficient documentation

## 2012-09-13 DIAGNOSIS — I10 Essential (primary) hypertension: Secondary | ICD-10-CM | POA: Insufficient documentation

## 2012-09-13 HISTORY — DX: Cerebral infarction, unspecified: I63.9

## 2012-09-13 LAB — URINALYSIS, ROUTINE W REFLEX MICROSCOPIC
Bilirubin Urine: NEGATIVE
Nitrite: NEGATIVE
Protein, ur: NEGATIVE mg/dL
Specific Gravity, Urine: 1.016 (ref 1.005–1.030)
Urobilinogen, UA: 0.2 mg/dL (ref 0.0–1.0)
pH: 5 (ref 5.0–8.0)

## 2012-09-13 LAB — CBC
HCT: 43 % (ref 39.0–52.0)
Hemoglobin: 15.4 g/dL (ref 13.0–17.0)
MCH: 30.8 pg (ref 26.0–34.0)
MCHC: 35.8 g/dL (ref 30.0–36.0)
RBC: 5 MIL/uL (ref 4.22–5.81)

## 2012-09-13 LAB — BASIC METABOLIC PANEL
BUN: 21 mg/dL (ref 6–23)
CO2: 27 mEq/L (ref 19–32)
GFR calc non Af Amer: 54 mL/min — ABNORMAL LOW (ref >90)
Glucose, Bld: 101 mg/dL — ABNORMAL HIGH (ref 70–99)
Potassium: 4.2 mEq/L (ref 3.5–5.1)

## 2012-09-13 LAB — URINE MICROSCOPIC-ADD ON

## 2012-09-13 LAB — POCT I-STAT TROPONIN I

## 2012-09-13 NOTE — ED Notes (Signed)
Pt daughter states this afternoon he has seemed to have difficulty walking and "has seemed spacey." Pt reports last night he felt nauseated and today he feels weak. Pt denies pain. Grips = bilateral, no arm drift or facial droop, no slurred speech. Pt is A&Ox4, resp e/u. History of stroke with R sided weakness

## 2012-09-13 NOTE — ED Provider Notes (Signed)
History     CSN: [id number]  Arrival date & time 09/13/12  1802   First MD Initiated Contact with Patient 09/13/12 1944      Chief Complaint  Patient presents with  . Weakness    (Consider location/radiation/quality/duration/timing/severity/associated sxs/prior treatment) HPI Comments: Patient presents for evaluation after an episode of weakness and nausea that occurred last night.  Then this afternoon he felt weak again.  Each of these episodes lasted only minutes and he tells me he now feels fine.  No chest pain or shortness of breath.  No abd pain.  No fevers.  Patient is a 79 y.o. male presenting with weakness. The history is provided by the patient.  Weakness This is a new problem. The current episode started yesterday. The problem has been gradually improving. Pertinent negatives include no chest pain, no abdominal pain, no headaches and no shortness of breath. Nothing aggravates the symptoms. Nothing relieves the symptoms. He has tried nothing for the symptoms.    Past Medical History  Diagnosis Date  . COPD (chronic obstructive pulmonary disease)   . Hyperlipidemia   . CAD (coronary artery disease)     by cath 1/11, medical management advised  . Cardiomyopathy, ischemic     EF 40% by echo 1/11  . CVD (cardiovascular disease)     s/p MCA stroke 1/11  . HTN (hypertension)   . Seizure disorder   . Permanent atrial fibrillation   . Complete heart block     s/p SJM PPM by JSA 01/10/11  . Stroke     Past Surgical History  Procedure Laterality Date  . Pacemaker insertion  01/10/11    by Fawn Kirk for CHB    History reviewed. No pertinent family history.  History  Substance Use Topics  . Smoking status: Former Games developer  . Smokeless tobacco: Not on file     Comment: quit 40 years ago  . Alcohol Use: No      Review of Systems  Constitutional: Negative for fever.  Respiratory: Negative for shortness of breath.   Cardiovascular: Negative for chest pain.   Gastrointestinal: Negative for abdominal pain.  Neurological: Positive for weakness. Negative for headaches.  All other systems reviewed and are negative.    Allergies  Review of patient's allergies indicates no known allergies.  Home Medications   Current Outpatient Rx  Name  Route  Sig  Dispense  Refill  . aspirin EC 81 MG tablet   Oral   Take 81 mg by mouth daily.         . carvedilol (COREG) 6.25 MG tablet   Oral   Take 3.125 mg by mouth 2 (two) times daily.          . Cholecalciferol (VITAMIN D) 2000 UNITS CAPS   Oral   Take 2,000 Units by mouth daily.          . diclofenac sodium (VOLTAREN) 1 % GEL   Topical   Apply 1 application topically daily as needed (knee pain). 4-5 times week         . furosemide (LASIX) 40 MG tablet   Oral   Take 20 mg by mouth 3 (three) times a week. Monday, Wednesday and Friday         . isosorbide mononitrate (IMDUR) 30 MG 24 hr tablet   Oral   Take 30 mg by mouth daily.          Marland Kitchen levETIRAcetam (KEPPRA) 250 MG tablet   Oral   Take  250 mg by mouth 2 (two) times daily.          Marland Kitchen lisinopril (PRINIVIL,ZESTRIL) 2.5 MG tablet   Oral   Take 1 tablet (2.5 mg total) by mouth daily.   30 tablet   11   . NITROSTAT 0.4 MG SL tablet   Sublingual   Place 0.4 mg under the tongue as needed for chest pain.          . simvastatin (ZOCOR) 40 MG tablet   Oral   Take 40 mg by mouth at bedtime.           Marland Kitchen warfarin (COUMADIN) 2.5 MG tablet   Oral   Take 3.75 mg by mouth every evening.           BP 112/67  Pulse 59  Temp(Src) 98.3 F (36.8 C) (Oral)  Resp 20  SpO2 95%  Physical Exam  Nursing note and vitals reviewed. Constitutional: He is oriented to person, place, and time. He appears well-developed and well-nourished. No distress.  HENT:  Head: Normocephalic and atraumatic.  Mouth/Throat: Oropharynx is clear and moist.  Eyes: Pupils are equal, round, and reactive to light.  Neck: Normal range of motion.  Neck supple.  Cardiovascular: Normal rate and regular rhythm.   No murmur heard. Pulmonary/Chest: Effort normal and breath sounds normal. No respiratory distress. He has no wheezes.  Abdominal: Soft. Bowel sounds are normal. He exhibits no distension. There is no tenderness.  Musculoskeletal: Normal range of motion. He exhibits no edema.  Lymphadenopathy:    He has no cervical adenopathy.  Neurological: He is alert and oriented to person, place, and time.  Skin: Skin is warm and dry. He is not diaphoretic.    ED Course  Procedures (including critical care time)  Labs Reviewed  CBC - Abnormal; Notable for the following:    Platelets 135 (*)    All other components within normal limits  BASIC METABOLIC PANEL - Abnormal; Notable for the following:    Glucose, Bld 101 (*)    GFR calc non Af Amer 54 (*)    GFR calc Af Amer 63 (*)    All other components within normal limits  GLUCOSE, CAPILLARY - Abnormal; Notable for the following:    Glucose-Capillary 101 (*)    All other components within normal limits  URINALYSIS, ROUTINE W REFLEX MICROSCOPIC - Abnormal; Notable for the following:    APPearance CLOUDY (*)    Hgb urine dipstick SMALL (*)    Leukocytes, UA SMALL (*)    All other components within normal limits  URINE MICROSCOPIC-ADD ON  POCT I-STAT TROPONIN I   Dg Chest 2 View  09/13/2012  *RADIOLOGY REPORT*  Clinical Data: Weakness and dizziness and chest pain.  CHEST - 2 VIEW  Comparison: PA and lateral chest 01/11/2011.  Findings: Pacing device in place.  There is cardiomegaly.  Mild, patchy airspace disease is seen in the lung bases.  No pneumothorax or pleural fluid.  IMPRESSION: Cardiomegaly and mild, patchy basilar airspace disease likely due to atelectasis.   Original Report Authenticated By: Holley Dexter, M.D.    Ct Head Wo Contrast  09/13/2012  *RADIOLOGY REPORT*  Clinical Data: Altered mental status, nausea  CT HEAD WITHOUT CONTRAST  Technique:  Contiguous axial images  were obtained from the base of the skull through the vertex without contrast.  Comparison: Head CT 05/12/2009  Findings: There is encephalomalacia within the left insular ribbon and lentiform nucleus (image 16).  There is no acute  loss of cortical definition.  No acute intracranial hemorrhage.  No focal mass lesion.  No midline shift or mass effect.  There is generalized cortical atrophy.  No evidence of skull fracture.  Paranasal sinuses are clear.  IMPRESSION:  1.  Remote infarction within the left insular ribbon and left lentiform nucleus is new from 2011. 2.  Generalized cortical atrophy. 3.  No acute intracranial findings.   Original Report Authenticated By: Genevive Bi, M.D.      1. Weakness      Date: 09/13/2012  Rate: 61  Rhythm: Ventricular pace  QRS Axis: indeterminate  Intervals: Indeterminate  ST/T Wave abnormalities: indeterminate  Conduction Disutrbances:none  Narrative Interpretation:   Old EKG Reviewed: unchanged    MDM  The patient presents after two episodes of weakness that occurred in the past 24 hours.  There were no other specific complaints.  Workup is essentially unremarkable and he is feeling better.  I doubt a cardiac etiology and the patient appears otherwise well.  He wants to go home and I don't see any contraindication to this.  He will be discharged to home, to return prn.        Geoffery Lyons, MD 09/13/12 [phone]

## 2012-10-14 ENCOUNTER — Ambulatory Visit (INDEPENDENT_AMBULATORY_CARE_PROVIDER_SITE_OTHER): Payer: Medicare Other | Admitting: *Deleted

## 2012-10-14 DIAGNOSIS — Z7901 Long term (current) use of anticoagulants: Secondary | ICD-10-CM

## 2012-10-14 DIAGNOSIS — I4891 Unspecified atrial fibrillation: Secondary | ICD-10-CM

## 2012-10-20 ENCOUNTER — Other Ambulatory Visit: Payer: Self-pay | Admitting: Internal Medicine

## 2012-10-21 ENCOUNTER — Other Ambulatory Visit: Payer: Self-pay | Admitting: Internal Medicine

## 2012-11-24 ENCOUNTER — Ambulatory Visit (INDEPENDENT_AMBULATORY_CARE_PROVIDER_SITE_OTHER): Payer: Medicare Other | Admitting: Internal Medicine

## 2012-11-24 ENCOUNTER — Ambulatory Visit (INDEPENDENT_AMBULATORY_CARE_PROVIDER_SITE_OTHER): Payer: Medicare Other | Admitting: *Deleted

## 2012-11-24 ENCOUNTER — Encounter: Payer: Self-pay | Admitting: Internal Medicine

## 2012-11-24 VITALS — BP 120/82 | HR 66 | Ht 68.0 in | Wt 180.6 lb

## 2012-11-24 DIAGNOSIS — I442 Atrioventricular block, complete: Secondary | ICD-10-CM

## 2012-11-24 DIAGNOSIS — Z950 Presence of cardiac pacemaker: Secondary | ICD-10-CM

## 2012-11-24 DIAGNOSIS — Z7901 Long term (current) use of anticoagulants: Secondary | ICD-10-CM

## 2012-11-24 DIAGNOSIS — I4891 Unspecified atrial fibrillation: Secondary | ICD-10-CM

## 2012-11-24 DIAGNOSIS — I519 Heart disease, unspecified: Secondary | ICD-10-CM

## 2012-11-24 DIAGNOSIS — I2510 Atherosclerotic heart disease of native coronary artery without angina pectoris: Secondary | ICD-10-CM

## 2012-11-24 LAB — PACEMAKER DEVICE OBSERVATION
BATTERY VOLTAGE: 2.98 V
BMOD-0002RV: 12
RV LEAD IMPEDENCE PM: 610 Ohm
RV LEAD THRESHOLD: 0.75 V
VENTRICULAR PACING PM: 98

## 2012-11-24 LAB — POCT INR: INR: 2.8

## 2012-11-24 MED ORDER — RIVAROXABAN 20 MG PO TABS: 20.0000 mg | ORAL_TABLET | Freq: Every day | ORAL | Status: DC

## 2012-11-24 NOTE — Patient Instructions (Addendum)
Remote monitoring is used to monitor your Pacemaker of ICD from home. This monitoring reduces the number of office visits required to check your device to one time per year. It allows [place] to keep an eye on the functioning of your device to ensure it is working properly. You are scheduled for a device check from home on February 28, 2013. You may send your transmission at any time that day. If you have a wireless device, the transmission will be sent automatically. After your physician reviews your transmission, you will receive a postcard with your next transmission date.  Your physician wants you to follow-up in: 1 year with Dr Johney Frame.  You will receive a reminder letter in the mail two months in advance. If you don't receive a letter, please call our office to schedule the follow-up appointment.  STOP ASPIRIN  STOP WARFARIN  START XARELTO 20 MG ONCE DAILY

## 2012-11-24 NOTE — Progress Notes (Signed)
PCP: Daniel Hai, MD   Daniel Brown is a 79 y.o. male who presents today for routine electrophysiology followup.  Since last being seen in our clinic, the patient reports doing very well.  Today, he denies symptoms of palpitations, chest pain, shortness of breath,  lower extremity edema, dizziness, presyncope, or syncope.  The patient is otherwise without complaint today.   Past Medical History  Diagnosis Date  . COPD (chronic obstructive pulmonary disease)   . Hyperlipidemia   . CAD (coronary artery disease)     by cath 1/11, medical management advised  . Cardiomyopathy, ischemic     EF 40% by echo 1/11  . CVD (cardiovascular disease)     s/p MCA stroke 1/11  . HTN (hypertension)   . Seizure disorder   . Permanent atrial fibrillation   . Complete heart block     s/p SJM PPM by JSA 01/10/11  . Stroke    Past Surgical History  Procedure Laterality Date  . Pacemaker insertion  01/10/11    by JA for CHB    Current Outpatient Prescriptions  Medication Sig Dispense Refill  . aspirin EC 81 MG tablet Take 81 mg by mouth daily.      . carvedilol (COREG) 6.25 MG tablet Take 3.125 mg by mouth 2 (two) times daily.       . Cholecalciferol (VITAMIN D) 2000 UNITS CAPS Take 2,000 Units by mouth daily.       . diclofenac sodium (VOLTAREN) 1 % GEL Apply 1 application topically daily as needed (knee pain). 4-5 times week      . furosemide (LASIX) 40 MG tablet Take 20 mg by mouth 3 (three) times a week. Monday, Wednesday and Friday      . isosorbide mononitrate (IMDUR) 30 MG 24 hr tablet Take 30 mg by mouth daily.       Marland Kitchen levETIRAcetam (KEPPRA) 250 MG tablet Take 250 mg by mouth 2 (two) times daily.       Marland Kitchen lisinopril (PRINIVIL,ZESTRIL) 2.5 MG tablet TAKE ONE TABLET DAILY.  30 tablet  2  . NITROSTAT 0.4 MG SL tablet Place 0.4 mg under the tongue as needed for chest pain.       . simvastatin (ZOCOR) 40 MG tablet Take 40 mg by mouth at bedtime.        Marland Kitchen warfarin (COUMADIN) 2.5 MG tablet Take as  directed by coumadin clinic  50 tablet  3   No current facility-administered medications for this visit.    Physical Exam: Filed Vitals:   11/24/12 0954  BP: 120/82  Pulse: 66  Height: 5\' 8"  (1.727 m)  Weight: 180 lb 9.6 oz (81.92 kg)    GEN- The patient is well appearing, alert and oriented x 3 today.   Head- normocephalic, atraumatic Eyes-  Sclera clear, conjunctiva pink Ears- hearing intact Oropharynx- clear Lungs- Clear to ausculation bilaterally, normal work of breathing Chest- pacemaker pocket is well healed Heart- irregular rate and rhythm, no murmurs, rubs or gallops, PMI not laterally displaced GI- soft, NT, ND, + BS Extremities- no clubbing, cyanosis, or edema  Pacemaker interrogation- reviewed in detail today,  See PACEART report  Assessment and Plan:  1. Complete heart block Normal pacemaker function See Pace Art report No changes today  2. Permanent afib Today, I discussed novel anticoagulants including pradaxa, xarelto, and eliquis today as indicated for risk reduction in stroke and systemic emboli with nonvalvular atrial fibrillation.  Risks, benefits, and alternatives to each of these drugs were  discussed at length today.  He would prefer xarelto.  His CrCL is 56 today.  I will start xarelto 20mg  daily.  Stop ASA. He will follow-up in the anticoagulation clinic as schedule in 6 weeks for reassessment.  3. CAD Stable No change required today  4. Chronic systolic dysfunction Class II Doing well  Return in 1 year merlin

## 2012-11-26 ENCOUNTER — Encounter: Payer: Self-pay | Admitting: Internal Medicine

## 2012-11-26 ENCOUNTER — Telehealth: Payer: Self-pay | Admitting: Internal Medicine

## 2012-11-26 NOTE — Telephone Encounter (Signed)
Follow up ° ° °Returning your call. °

## 2012-11-26 NOTE — Telephone Encounter (Signed)
Called [facility] was called for Xarelto 20 mg once a day Pre- authorization. Medication was preauthorized expired 11/26/13. Pt's pharmacy aware. I left pt's family member a message regarding the pre-authorization.

## 2012-11-26 NOTE — Telephone Encounter (Signed)
Left a message to call back.

## 2012-11-26 NOTE — Telephone Encounter (Signed)
New Prob  Granddaughter called and stated the pharmacy said that his Xarelto needs prior authorization.

## 2012-12-09 ENCOUNTER — Encounter: Payer: Medicare Other | Admitting: Internal Medicine

## 2012-12-15 ENCOUNTER — Other Ambulatory Visit: Payer: Self-pay | Admitting: Internal Medicine

## 2013-01-05 ENCOUNTER — Ambulatory Visit (INDEPENDENT_AMBULATORY_CARE_PROVIDER_SITE_OTHER): Payer: Medicare Other | Admitting: *Deleted

## 2013-01-05 DIAGNOSIS — I4891 Unspecified atrial fibrillation: Secondary | ICD-10-CM

## 2013-01-05 DIAGNOSIS — I10 Essential (primary) hypertension: Secondary | ICD-10-CM

## 2013-01-05 DIAGNOSIS — Z7901 Long term (current) use of anticoagulants: Secondary | ICD-10-CM

## 2013-01-05 LAB — BASIC METABOLIC PANEL
CO2: 31 mEq/L (ref 19–32)
Calcium: 9.9 mg/dL (ref 8.4–10.5)
Glucose, Bld: 87 mg/dL (ref 70–99)
Potassium: 4.7 mEq/L (ref 3.5–5.3)
Sodium: 143 mEq/L (ref 135–145)

## 2013-01-05 LAB — CBC
Hemoglobin: 16.2 g/dL (ref 13.0–17.0)
Platelets: 151 10*3/uL (ref 150–400)
RBC: 5.34 MIL/uL (ref 4.22–5.81)
WBC: 7 10*3/uL (ref 4.0–10.5)

## 2013-01-05 NOTE — Progress Notes (Signed)
Pt was started on Xarelto 20 mg  for AF on 11/24/2012 No signs and symptoms of reaction Daughter is here with patient Labs called to dtr after receiving results  Reviewed patients medication list.  Pt is not currently on any combined P-gp and strong CYP3A4 inhibitors/inducers (ketoconazole, traconazole, ritonavir, carbamazepine, phenytoin, rifampin, St. John's wort).  Reviewed labs.  SCr 1.23, Weight 81.92 kg, CrCl- 53.65.  Dose appropriate based on CrCl.   Hgb 16.2 and HCT 46.7  A full discussion of the nature of anticoagulants has been carried out.  A benefit/risk analysis has been presented to the patient, so that they understand the justification for choosing anticoagulation with Xarelto at this time.  The need for compliance is stressed.  Pt is aware to take the medication once daily with the largest meal of the day.  Side effects of potential bleeding are discussed, including unusual colored urine or stools, coughing up blood or coffee ground emesis, nose bleeds or serious fall or head trauma.  Discussed signs and symptoms of stroke. The patient should avoid any OTC items containing aspirin or ibuprofen.  Avoid alcohol consumption.   Call if any signs of abnormal bleeding.  Discussed financial obligations and resolved any difficulty in obtaining medication.  Next lab test test in 6 months.

## 2013-02-28 ENCOUNTER — Ambulatory Visit (INDEPENDENT_AMBULATORY_CARE_PROVIDER_SITE_OTHER): Payer: Medicare Other | Admitting: *Deleted

## 2013-02-28 DIAGNOSIS — I442 Atrioventricular block, complete: Secondary | ICD-10-CM

## 2013-02-28 DIAGNOSIS — Z950 Presence of cardiac pacemaker: Secondary | ICD-10-CM

## 2013-02-28 DIAGNOSIS — I4891 Unspecified atrial fibrillation: Secondary | ICD-10-CM

## 2013-03-03 LAB — REMOTE PACEMAKER DEVICE
BMOD-0002RV: 12
BRDY-0004RV: 110 {beats}/min
BRDY-0005RV: 50 {beats}/min
RV LEAD AMPLITUDE: 12 mv
RV LEAD THRESHOLD: 0.625 V
VENTRICULAR PACING PM: 99

## 2013-03-10 ENCOUNTER — Encounter: Payer: Self-pay | Admitting: *Deleted

## 2013-03-22 ENCOUNTER — Encounter: Payer: Self-pay | Admitting: Internal Medicine

## 2013-05-30 ENCOUNTER — Encounter: Payer: Medicare Other | Admitting: *Deleted

## 2013-05-30 ENCOUNTER — Encounter: Payer: Self-pay | Admitting: Internal Medicine

## 2013-05-30 DIAGNOSIS — I442 Atrioventricular block, complete: Secondary | ICD-10-CM

## 2013-05-30 LAB — MDC_IDC_ENUM_SESS_TYPE_REMOTE
Battery Remaining Longevity: 127 mo
Lead Channel Impedance Value: 650 Ohm
Lead Channel Pacing Threshold Pulse Width: 0.5 ms
Lead Channel Setting Pacing Amplitude: 1 V
Lead Channel Setting Sensing Sensitivity: 2 mV
MDC IDC MSMT BATTERY VOLTAGE: 2.98 V
MDC IDC MSMT LEADCHNL RV PACING THRESHOLD AMPLITUDE: 0.75 V
MDC IDC MSMT LEADCHNL RV SENSING INTR AMPL: 12 mV
MDC IDC PG SERIAL: 2667494
MDC IDC SESS DTM: 20150119075550
MDC IDC SET LEADCHNL RV PACING PULSEWIDTH: 0.5 ms
MDC IDC STAT BRADY RV PERCENT PACED: 98 %

## 2013-06-07 ENCOUNTER — Encounter: Payer: Self-pay | Admitting: *Deleted

## 2013-06-22 ENCOUNTER — Other Ambulatory Visit: Payer: Self-pay | Admitting: Internal Medicine

## 2013-07-11 ENCOUNTER — Ambulatory Visit (INDEPENDENT_AMBULATORY_CARE_PROVIDER_SITE_OTHER): Payer: Medicare Other | Admitting: *Deleted

## 2013-07-11 DIAGNOSIS — Z7901 Long term (current) use of anticoagulants: Secondary | ICD-10-CM

## 2013-07-11 DIAGNOSIS — I10 Essential (primary) hypertension: Secondary | ICD-10-CM

## 2013-07-11 DIAGNOSIS — I4891 Unspecified atrial fibrillation: Secondary | ICD-10-CM

## 2013-07-11 LAB — BASIC METABOLIC PANEL
BUN: 22 mg/dL (ref 6–23)
CALCIUM: 9.8 mg/dL (ref 8.4–10.5)
CO2: 28 mEq/L (ref 19–32)
CREATININE: 1.15 mg/dL (ref 0.50–1.35)
Chloride: 107 mEq/L (ref 96–112)
Glucose, Bld: 98 mg/dL (ref 70–99)
Potassium: 4.9 mEq/L (ref 3.5–5.3)
SODIUM: 144 meq/L (ref 135–145)

## 2013-07-11 LAB — CBC
HEMATOCRIT: 48.3 % (ref 39.0–52.0)
HEMOGLOBIN: 16.7 g/dL (ref 13.0–17.0)
MCH: 30.4 pg (ref 26.0–34.0)
MCHC: 34.6 g/dL (ref 30.0–36.0)
MCV: 88 fL (ref 78.0–100.0)
Platelets: 154 10*3/uL (ref 150–400)
RBC: 5.49 MIL/uL (ref 4.22–5.81)
RDW: 13.8 % (ref 11.5–15.5)
WBC: 6.8 10*3/uL (ref 4.0–10.5)

## 2013-07-11 NOTE — Patient Instructions (Addendum)
Pt here for 6 month Xarelto Follow Up.  Pt was started on Xarelto for Atrial Fib on 11/24/12.    Reviewed patients medication list.  Pt is not currently on any combined P-gp and strong CYP3A4 inhibitors/inducers (ketoconazole, traconazole, ritonavir, carbamazepine, phenytoin, rifampin, St. John's wort).  Reviewed labs.  SCr 1.15, Weight 178,  CrCl 55.58.  Dose appropriate based on CrCl.   Hgb and HCT 16.7/48.3  Pt denies any problems since starting on Xarelto.  No increased bruising, bleeding or GI upset.  The need for compliance is stressed.  Pt is aware to take the medication once daily with the largest meal of the day.  Side effects of potential bleeding are discussed, including unusual colored urine or stools, coughing up blood or coffee ground emesis, nose bleeds or serious fall or head trauma.  Discussed signs and symptoms of stroke. The patient should avoid any OTC items containing aspirin or ibuprofen.  Avoid alcohol consumption.   Call if any signs of abnormal bleeding.  Discussed financial obligations and resolved any difficulty in obtaining medication.  Next lab test test in 6 months.

## 2013-08-31 ENCOUNTER — Ambulatory Visit (INDEPENDENT_AMBULATORY_CARE_PROVIDER_SITE_OTHER): Payer: Medicare Other | Admitting: *Deleted

## 2013-08-31 ENCOUNTER — Encounter: Payer: Self-pay | Admitting: Internal Medicine

## 2013-08-31 DIAGNOSIS — I4891 Unspecified atrial fibrillation: Secondary | ICD-10-CM

## 2013-08-31 DIAGNOSIS — I442 Atrioventricular block, complete: Secondary | ICD-10-CM

## 2013-08-31 LAB — MDC_IDC_ENUM_SESS_TYPE_REMOTE
Brady Statistic RV Percent Paced: 98 %
Lead Channel Impedance Value: 610 Ohm
Lead Channel Setting Pacing Amplitude: 1 V
Lead Channel Setting Pacing Pulse Width: 0.5 ms
MDC IDC MSMT BATTERY REMAINING LONGEVITY: 133 mo
MDC IDC MSMT BATTERY VOLTAGE: 2.98 V
MDC IDC MSMT LEADCHNL RV PACING THRESHOLD AMPLITUDE: 0.75 V
MDC IDC MSMT LEADCHNL RV PACING THRESHOLD PULSEWIDTH: 0.5 ms
MDC IDC MSMT LEADCHNL RV SENSING INTR AMPL: 11.8 mV
MDC IDC PG SERIAL: 2667494
MDC IDC SESS DTM: 20150422062647
MDC IDC SET LEADCHNL RV SENSING SENSITIVITY: 2 mV

## 2013-09-06 ENCOUNTER — Encounter: Payer: Self-pay | Admitting: *Deleted

## 2013-09-13 NOTE — Progress Notes (Signed)
Pacemaker remote check. Device function reviewed. Impedance, sensing, auto capture thresholds consistent with previous measurements. Histograms appropriate for patient and level of activity. All other diagnostic data reviewed and is appropriate and stable for patient. Real time/magnet EGM shows appropriate sensing and capture. No ventricular high rate episodes. Estimated longevity 10.6 years. Plan to follow in 3 months remotely, to see in office annually.  ROV in July with Dr. Johney FrameAllred.

## 2013-11-22 ENCOUNTER — Other Ambulatory Visit: Payer: Self-pay

## 2013-11-22 MED ORDER — RIVAROXABAN 20 MG PO TABS: ORAL_TABLET | ORAL | Status: DC

## 2013-12-20 ENCOUNTER — Other Ambulatory Visit: Payer: Self-pay | Admitting: *Deleted

## 2013-12-20 MED ORDER — LISINOPRIL 2.5 MG PO TABS: ORAL_TABLET | ORAL | Status: DC

## 2013-12-23 ENCOUNTER — Encounter: Payer: Self-pay | Admitting: *Deleted

## 2014-01-19 ENCOUNTER — Other Ambulatory Visit: Payer: Self-pay | Admitting: Internal Medicine

## 2014-01-24 ENCOUNTER — Encounter: Payer: Self-pay | Admitting: Internal Medicine

## 2014-04-03 ENCOUNTER — Encounter: Payer: Medicare Other | Admitting: Internal Medicine

## 2014-04-12 ENCOUNTER — Other Ambulatory Visit: Payer: Self-pay | Admitting: Internal Medicine

## 2014-06-16 ENCOUNTER — Encounter: Payer: Self-pay | Admitting: Internal Medicine

## 2014-06-16 ENCOUNTER — Ambulatory Visit (INDEPENDENT_AMBULATORY_CARE_PROVIDER_SITE_OTHER): Payer: Medicare Other | Admitting: Internal Medicine

## 2014-06-16 VITALS — BP 113/73 | HR 74 | Ht 68.0 in | Wt 171.8 lb

## 2014-06-16 DIAGNOSIS — I442 Atrioventricular block, complete: Secondary | ICD-10-CM

## 2014-06-16 DIAGNOSIS — I482 Chronic atrial fibrillation, unspecified: Secondary | ICD-10-CM

## 2014-06-16 DIAGNOSIS — Z950 Presence of cardiac pacemaker: Secondary | ICD-10-CM

## 2014-06-16 DIAGNOSIS — I2510 Atherosclerotic heart disease of native coronary artery without angina pectoris: Secondary | ICD-10-CM

## 2014-06-16 DIAGNOSIS — I10 Essential (primary) hypertension: Secondary | ICD-10-CM

## 2014-06-16 LAB — MDC_IDC_ENUM_SESS_TYPE_INCLINIC
Battery Remaining Longevity: 124.8 mo
Brady Statistic RV Percent Paced: 98 %
Date Time Interrogation Session: 20160205091243
Implantable Pulse Generator Model: 1210
Implantable Pulse Generator Serial Number: 2667494
Lead Channel Pacing Threshold Amplitude: 0.75 V
Lead Channel Pacing Threshold Pulse Width: 0.5 ms
Lead Channel Setting Pacing Amplitude: 2.5 V
Lead Channel Setting Sensing Sensitivity: 2 mV
MDC IDC MSMT BATTERY VOLTAGE: 2.98 V
MDC IDC MSMT LEADCHNL RV IMPEDANCE VALUE: 612.5 Ohm
MDC IDC SET LEADCHNL RV PACING PULSEWIDTH: 0.5 ms

## 2014-06-16 NOTE — Progress Notes (Signed)
The patient presents today for routine electrophysiology followup.  Since his last visit, the patient reports doing very well.  He is excited about the super bowl.  He has done well since I saw him last.  CAD is stable.  COPD is stable.  Tolerating anticoagulation for afib. Today, he denies symptoms of palpitations, chest pain, shortness of breath, dizziness, presyncope, syncope, or neurologic sequela.  The patient feels that he is tolerating medications without difficulties and is otherwise without complaint today.   Past Medical History  Diagnosis Date  . COPD (chronic obstructive pulmonary disease)   . Hyperlipidemia   . CAD (coronary artery disease)     by cath 1/11, medical management advised  . Cardiomyopathy, ischemic     EF 40% by echo 1/11  . CVD (cardiovascular disease)     s/p MCA stroke 1/11  . HTN (hypertension)   . Seizure disorder   . Permanent atrial fibrillation   . Complete heart block     s/p SJM PPM by JSA 01/10/11  . Stroke    Past Surgical History  Procedure Laterality Date  . Pacemaker insertion  01/10/11    by JA for CHB    Current Outpatient Prescriptions  Medication Sig Dispense Refill  . carvedilol (COREG) 6.25 MG tablet Take 3.125 mg by mouth 2 (two) times daily.     . Cholecalciferol (VITAMIN D) 2000 UNITS CAPS Take 2,000 Units by mouth daily.     . diclofenac sodium (VOLTAREN) 1 % GEL Apply 1 application topically daily as needed (knee pain). 4-5 times week    . furosemide (LASIX) 40 MG tablet Take 20 mg by mouth 3 (three) times a week. Monday, Wednesday and Friday    . isosorbide mononitrate (IMDUR) 30 MG 24 hr tablet Take 30 mg by mouth daily.     Marland Kitchen. levETIRAcetam (KEPPRA) 250 MG tablet Take 250 mg by mouth 2 (two) times daily.     Marland Kitchen. lisinopril (PRINIVIL,ZESTRIL) 2.5 MG tablet TAKE 1 TABLET BY MOUTH DAILY 30 tablet 2  . NITROSTAT 0.4 MG SL tablet Place 0.4 mg under the tongue as needed for chest pain.     . simvastatin (ZOCOR) 40 MG tablet Take 40 mg by  mouth at bedtime.      Carlena Hurl. XARELTO 20 MG TABS tablet TAKE 1 TABLET BY MOUTH EVERY DAY 30 tablet 1   No current facility-administered medications for this visit.    History   Social History  . Marital Status: Single    Spouse Name: N/A    Number of Children: N/A  . Years of Education: N/A   Occupational History  . Retired    Social History Main Topics  . Smoking status: Former Games developermoker  . Smokeless tobacco: Not on file     Comment: quit 40 years ago  . Alcohol Use: No  . Drug Use: No  . Sexual Activity: Not on file   Other Topics Concern  . Not on file   Social History Narrative   [place].   ROS- all systems are reviewed and negative except as per HPI above  Physical Exam: Filed Vitals:   06/16/14 0838  BP: 113/73  Pulse: 74  Height: 5\' 8"  (1.727 m)  Weight: 171 lb 12.8 oz (77.928 kg)    GEN- The patient is well appearing, alert and oriented x 3 today.   Head- normocephalic, atraumatic Eyes-  Sclera clear, conjunctiva pink Ears- hearing intact Oropharynx- clear Neck- supple, no  JVP Lymph- no cervical lymphadenopathy Lungs- Clear to ausculation bilaterally, normal work of breathing Chest- pacemaker pocket is healing nicely Heart- Regular rate and rhythm (paced),  GI- soft, NT, ND, + BS Extremities- no clubbing, cyanosis, or edema  Pacemaker interrogation- reviewed in detail today,  See PACEART report  Assessment and Plan:  1. Complete heart block Normal pacemaker function See Pace Art report No changes today  2. Permanent afib Rate controlled chads2vasc is at least 7 Continue xarelto He has labs with PCP Will refer back to Vashti Hey to see in 6 months  3. CAD No ischemic symptoms No changes today  4. HTN Stable No change required today  5. Chronic systolic dysfunction euvolemic Continue current medicine regimen  Merlin Follow-up in anticoagulation in 6 months I will see in a year

## 2014-06-16 NOTE — Addendum Note (Signed)
Addended by: Lesle ChrisHILL, Janmichael Giraud G on: 06/16/2014 09:36 AM   Modules accepted: Level of Service

## 2014-06-16 NOTE — Patient Instructions (Signed)
Continue all current medications. Merlin remote 09-18-2014 6 months with Misty StanleyLisa - anticoagulation nurse Your physician wants you to follow up in:  1 year.  You will receive a reminder letter in the mail one-two months in advance.  If you don't receive a letter, please call our office to schedule the follow up appointment

## 2014-06-21 ENCOUNTER — Other Ambulatory Visit: Payer: Self-pay | Admitting: Internal Medicine

## 2014-06-23 ENCOUNTER — Encounter: Payer: Self-pay | Admitting: Internal Medicine

## 2014-06-27 ENCOUNTER — Ambulatory Visit: Payer: Self-pay | Admitting: *Deleted

## 2014-06-27 DIAGNOSIS — Z7901 Long term (current) use of anticoagulants: Secondary | ICD-10-CM

## 2014-06-27 DIAGNOSIS — I480 Paroxysmal atrial fibrillation: Secondary | ICD-10-CM

## 2014-07-08 ENCOUNTER — Other Ambulatory Visit: Payer: Self-pay | Admitting: Internal Medicine

## 2014-07-19 ENCOUNTER — Other Ambulatory Visit: Payer: Self-pay | Admitting: Family Medicine

## 2014-07-19 DIAGNOSIS — R945 Abnormal results of liver function studies: Principal | ICD-10-CM

## 2014-07-19 DIAGNOSIS — R7989 Other specified abnormal findings of blood chemistry: Secondary | ICD-10-CM

## 2014-07-25 ENCOUNTER — Ambulatory Visit
Admission: RE | Admit: 2014-07-25 | Discharge: 2014-07-25 | Disposition: A | Discharge status: Home / Self Care | Payer: Medicare Other | Source: Ambulatory Visit | Attending: Family Medicine | Admitting: Family Medicine

## 2014-07-25 DIAGNOSIS — R945 Abnormal results of liver function studies: Principal | ICD-10-CM

## 2014-07-25 DIAGNOSIS — R7989 Other specified abnormal findings of blood chemistry: Secondary | ICD-10-CM

## 2014-08-01 ENCOUNTER — Other Ambulatory Visit: Payer: Self-pay | Admitting: Family Medicine

## 2014-08-01 DIAGNOSIS — R1084 Generalized abdominal pain: Secondary | ICD-10-CM

## 2014-08-04 ENCOUNTER — Ambulatory Visit
Admission: RE | Admit: 2014-08-04 | Discharge: 2014-08-04 | Disposition: A | Discharge status: Home / Self Care | Payer: Medicare Other | Source: Ambulatory Visit | Attending: Family Medicine | Admitting: Family Medicine

## 2014-08-04 DIAGNOSIS — R1084 Generalized abdominal pain: Secondary | ICD-10-CM

## 2014-08-04 MED ORDER — IOPAMIDOL (ISOVUE-300) INJECTION 61%
100.0000 mL | Freq: Once | INTRAVENOUS | Status: AC | PRN
  Administered 2014-08-04: 100 mL via INTRAVENOUS

## 2014-09-18 ENCOUNTER — Ambulatory Visit (INDEPENDENT_AMBULATORY_CARE_PROVIDER_SITE_OTHER): Payer: Medicare Other | Admitting: *Deleted

## 2014-09-18 DIAGNOSIS — I442 Atrioventricular block, complete: Secondary | ICD-10-CM | POA: Diagnosis not present

## 2014-09-18 NOTE — Progress Notes (Signed)
Remote pacemaker transmission.   

## 2014-09-20 LAB — CUP PACEART REMOTE DEVICE CHECK
Battery Remaining Longevity: 104 mo
Battery Remaining Percentage: 82 %
Date Time Interrogation Session: 20160509061413
Lead Channel Impedance Value: 600 Ohm
Lead Channel Sensing Intrinsic Amplitude: 12 mV
MDC IDC MSMT BATTERY VOLTAGE: 2.96 V
MDC IDC SET LEADCHNL RV PACING AMPLITUDE: 2.5 V
MDC IDC SET LEADCHNL RV PACING PULSEWIDTH: 0.5 ms
MDC IDC SET LEADCHNL RV SENSING SENSITIVITY: 2 mV
MDC IDC STAT BRADY RV PERCENT PACED: 95 %
Pulse Gen Serial Number: 2667494

## 2014-10-04 ENCOUNTER — Encounter: Payer: Self-pay | Admitting: Cardiology

## 2014-10-10 ENCOUNTER — Encounter: Payer: Self-pay | Admitting: Internal Medicine

## 2014-10-15 ENCOUNTER — Other Ambulatory Visit: Payer: Self-pay | Admitting: Internal Medicine

## 2014-12-15 ENCOUNTER — Other Ambulatory Visit: Payer: Self-pay | Admitting: Gastroenterology

## 2014-12-15 DIAGNOSIS — R748 Abnormal levels of other serum enzymes: Secondary | ICD-10-CM

## 2014-12-18 ENCOUNTER — Other Ambulatory Visit: Payer: Self-pay | Admitting: Gastroenterology

## 2014-12-20 ENCOUNTER — Ambulatory Visit (INDEPENDENT_AMBULATORY_CARE_PROVIDER_SITE_OTHER): Payer: Medicare Other | Admitting: *Deleted

## 2014-12-20 DIAGNOSIS — I442 Atrioventricular block, complete: Secondary | ICD-10-CM | POA: Diagnosis not present

## 2014-12-20 NOTE — Progress Notes (Signed)
Remote pacemaker transmission.   

## 2014-12-27 LAB — CUP PACEART REMOTE DEVICE CHECK
Battery Remaining Percentage: 95.5 %
Battery Voltage: 2.98 V
Brady Statistic RV Percent Paced: 87 %
Lead Channel Impedance Value: 610 Ohm
Lead Channel Pacing Threshold Amplitude: 0.75 V
Lead Channel Pacing Threshold Pulse Width: 0.5 ms
Lead Channel Setting Pacing Amplitude: 2.5 V
Lead Channel Setting Pacing Pulse Width: 0.5 ms
Lead Channel Setting Sensing Sensitivity: 2 mV
MDC IDC MSMT BATTERY REMAINING LONGEVITY: 131 mo
MDC IDC MSMT LEADCHNL RV SENSING INTR AMPL: 12 mV
MDC IDC PG SERIAL: 2667494
MDC IDC SESS DTM: 20160810064448
Pulse Gen Model: 1210

## 2014-12-29 ENCOUNTER — Other Ambulatory Visit: Payer: Self-pay | Admitting: Gastroenterology

## 2014-12-29 DIAGNOSIS — R748 Abnormal levels of other serum enzymes: Secondary | ICD-10-CM

## 2014-12-29 DIAGNOSIS — R935 Abnormal findings on diagnostic imaging of other abdominal regions, including retroperitoneum: Secondary | ICD-10-CM

## 2015-01-02 ENCOUNTER — Encounter: Payer: Self-pay | Admitting: Cardiology

## 2015-01-05 ENCOUNTER — Encounter: Payer: Self-pay | Admitting: Internal Medicine

## 2015-01-08 ENCOUNTER — Ambulatory Visit
Admission: RE | Admit: 2015-01-08 | Discharge: 2015-01-08 | Disposition: A | Discharge status: Home / Self Care | Payer: Medicare Other | Source: Ambulatory Visit | Attending: Gastroenterology | Admitting: Gastroenterology

## 2015-01-08 DIAGNOSIS — R935 Abnormal findings on diagnostic imaging of other abdominal regions, including retroperitoneum: Secondary | ICD-10-CM

## 2015-01-08 DIAGNOSIS — R748 Abnormal levels of other serum enzymes: Secondary | ICD-10-CM

## 2015-02-08 ENCOUNTER — Other Ambulatory Visit: Payer: Self-pay | Admitting: Internal Medicine

## 2015-02-14 ENCOUNTER — Other Ambulatory Visit: Payer: Self-pay | Admitting: Internal Medicine

## 2015-03-21 ENCOUNTER — Ambulatory Visit (INDEPENDENT_AMBULATORY_CARE_PROVIDER_SITE_OTHER): Payer: Medicare Other | Admitting: *Deleted

## 2015-03-21 DIAGNOSIS — I442 Atrioventricular block, complete: Secondary | ICD-10-CM | POA: Diagnosis not present

## 2015-03-21 NOTE — Progress Notes (Signed)
Remote pacemaker transmission.   

## 2015-03-22 LAB — CUP PACEART REMOTE DEVICE CHECK
Battery Voltage: 2.96 V
Date Time Interrogation Session: 20161109071446
Implantable Lead Model: 1948
Lead Channel Impedance Value: 600 Ohm
Lead Channel Sensing Intrinsic Amplitude: 12 mV
Lead Channel Setting Pacing Amplitude: 2.5 V
Lead Channel Setting Sensing Sensitivity: 2 mV
MDC IDC LEAD IMPLANT DT: 20120831
MDC IDC LEAD LOCATION: 753860
MDC IDC MSMT BATTERY REMAINING LONGEVITY: 125 mo
MDC IDC MSMT BATTERY REMAINING PERCENTAGE: 95.5 %
MDC IDC SET LEADCHNL RV PACING PULSEWIDTH: 0.5 ms
MDC IDC STAT BRADY RV PERCENT PACED: 84 %
Pulse Gen Model: 1210
Pulse Gen Serial Number: 2667494

## 2015-03-23 ENCOUNTER — Encounter: Payer: Self-pay | Admitting: Cardiology

## 2015-05-08 ENCOUNTER — Emergency Department (HOSPITAL_COMMUNITY)
Admission: EM | Admit: 2015-05-08 | Discharge: 2015-05-09 | Disposition: A | Discharge status: Home / Self Care | Payer: Medicare Other | Attending: Emergency Medicine | Admitting: Emergency Medicine

## 2015-05-08 ENCOUNTER — Encounter (HOSPITAL_COMMUNITY): Payer: Self-pay | Admitting: *Deleted

## 2015-05-08 DIAGNOSIS — R600 Localized edema: Secondary | ICD-10-CM

## 2015-05-08 DIAGNOSIS — Z79899 Other long term (current) drug therapy: Secondary | ICD-10-CM | POA: Insufficient documentation

## 2015-05-08 DIAGNOSIS — I482 Chronic atrial fibrillation: Secondary | ICD-10-CM | POA: Insufficient documentation

## 2015-05-08 DIAGNOSIS — Z8673 Personal history of transient ischemic attack (TIA), and cerebral infarction without residual deficits: Secondary | ICD-10-CM | POA: Insufficient documentation

## 2015-05-08 DIAGNOSIS — I10 Essential (primary) hypertension: Secondary | ICD-10-CM | POA: Diagnosis not present

## 2015-05-08 DIAGNOSIS — Z87891 Personal history of nicotine dependence: Secondary | ICD-10-CM | POA: Insufficient documentation

## 2015-05-08 DIAGNOSIS — I2510 Atherosclerotic heart disease of native coronary artery without angina pectoris: Secondary | ICD-10-CM | POA: Insufficient documentation

## 2015-05-08 DIAGNOSIS — I255 Ischemic cardiomyopathy: Secondary | ICD-10-CM | POA: Insufficient documentation

## 2015-05-08 DIAGNOSIS — E785 Hyperlipidemia, unspecified: Secondary | ICD-10-CM | POA: Insufficient documentation

## 2015-05-08 DIAGNOSIS — G40909 Epilepsy, unspecified, not intractable, without status epilepticus: Secondary | ICD-10-CM | POA: Insufficient documentation

## 2015-05-08 DIAGNOSIS — I442 Atrioventricular block, complete: Secondary | ICD-10-CM | POA: Insufficient documentation

## 2015-05-08 DIAGNOSIS — R2242 Localized swelling, mass and lump, left lower limb: Secondary | ICD-10-CM | POA: Diagnosis not present

## 2015-05-08 DIAGNOSIS — J449 Chronic obstructive pulmonary disease, unspecified: Secondary | ICD-10-CM | POA: Insufficient documentation

## 2015-05-08 LAB — COMPREHENSIVE METABOLIC PANEL
ALT: 26 U/L (ref 17–63)
ANION GAP: 9 (ref 5–15)
AST: 27 U/L (ref 15–41)
Albumin: 4.4 g/dL (ref 3.5–5.0)
Alkaline Phosphatase: 64 U/L (ref 38–126)
BILIRUBIN TOTAL: 1.3 mg/dL — AB (ref 0.3–1.2)
BUN: 35 mg/dL — AB (ref 6–20)
CHLORIDE: 100 mmol/L — AB (ref 101–111)
CO2: 28 mmol/L (ref 22–32)
Calcium: 9.9 mg/dL (ref 8.9–10.3)
Creatinine, Ser: 1.33 mg/dL — ABNORMAL HIGH (ref 0.61–1.24)
GFR calc Af Amer: 55 mL/min — ABNORMAL LOW (ref >60)
GFR, EST NON AFRICAN AMERICAN: 47 mL/min — AB (ref >60)
Glucose, Bld: 100 mg/dL — ABNORMAL HIGH (ref 65–99)
POTASSIUM: 4.7 mmol/L (ref 3.5–5.1)
Sodium: 137 mmol/L (ref 135–145)
TOTAL PROTEIN: 7.9 g/dL (ref 6.5–8.1)

## 2015-05-08 LAB — CBC WITH DIFFERENTIAL/PLATELET
BASOS ABS: 0 10*3/uL (ref 0.0–0.1)
BASOS PCT: 0 %
EOS PCT: 2 %
Eosinophils Absolute: 0.1 10*3/uL (ref 0.0–0.7)
HEMATOCRIT: 49.9 % (ref 39.0–52.0)
Hemoglobin: 17 g/dL (ref 13.0–17.0)
LYMPHS PCT: 24 %
Lymphs Abs: 1.7 10*3/uL (ref 0.7–4.0)
MCH: 31.3 pg (ref 26.0–34.0)
MCHC: 34.1 g/dL (ref 30.0–36.0)
MCV: 91.9 fL (ref 78.0–100.0)
MONO ABS: 1 10*3/uL (ref 0.1–1.0)
MONOS PCT: 14 %
NEUTROS ABS: 4.1 10*3/uL (ref 1.7–7.7)
Neutrophils Relative %: 60 %
PLATELETS: 145 10*3/uL — AB (ref 150–400)
RBC: 5.43 MIL/uL (ref 4.22–5.81)
RDW: 13.1 % (ref 11.5–15.5)
WBC: 6.9 10*3/uL (ref 4.0–10.5)

## 2015-05-08 NOTE — ED Notes (Signed)
Pt reports left foot swelling that started yesterday, denies injury. Foot is red and swollen and warm to touch.

## 2015-05-08 NOTE — ED Notes (Signed)
Pt c/o left foot swelling, pain and redness that started yesterday and becoming worse tonight, denies any injury,

## 2015-05-09 ENCOUNTER — Emergency Department (HOSPITAL_COMMUNITY): Payer: Medicare Other

## 2015-05-09 MED ORDER — PREDNISONE 50 MG PO TABS
60.0000 mg | ORAL_TABLET | Freq: Once | ORAL | Status: AC
  Administered 2015-05-09: 60 mg via ORAL
  Filled 2015-05-09: qty 1

## 2015-05-09 MED ORDER — PREDNISONE 10 MG PO TABS: ORAL_TABLET | ORAL | Status: DC

## 2015-05-09 MED ORDER — CLINDAMYCIN HCL 150 MG PO CAPS: 150.0000 mg | ORAL_CAPSULE | Freq: Three times a day (TID) | ORAL | Status: DC

## 2015-05-09 MED ORDER — CLINDAMYCIN HCL 150 MG PO CAPS
150.0000 mg | ORAL_CAPSULE | Freq: Once | ORAL | Status: AC
  Administered 2015-05-09: 150 mg via ORAL
  Filled 2015-05-09: qty 1

## 2015-05-09 NOTE — ED Provider Notes (Signed)
CSN: [id number]     Arrival date & time 05/08/15  1819 History   First MD Initiated Contact with Patient 05/08/15 2321     Chief Complaint  Patient presents with  . left foot swelling      (Consider location/radiation/quality/duration/timing/severity/associated sxs/prior Treatment) The history is provided by the patient and a relative.   Daniel Brown is a 81 y.o. male with a past medical history outlined below presenting with a 1 day history of left foot swelling, redness and increased pain.  He denies any known injury to the foot.  He reports being fairly active, exercises daily but denies falls, puncture wounds or other injury. He denies fevers chills, nausea or other complaint.  His pain does not radiate and is worsened by weight bearing and better at rest.  He has taken no medications prior to arrival for this problem.      Past Medical History  Diagnosis Date  . COPD (chronic obstructive pulmonary disease) (HCC)   . Hyperlipidemia   . Cardiomyopathy, ischemic     EF 40% by echo 1/11  . CVD (cardiovascular disease)     s/p MCA stroke 1/11  . HTN (hypertension)   . Seizure disorder (HCC)   . Permanent atrial fibrillation (HCC)   . Complete heart block (HCC)     s/p SJM PPM by JSA 01/10/11  . CAD (coronary artery disease)     by cath 1/11, medical management advised  . Stroke [facility])    Past Surgical History  Procedure Laterality Date  . Pacemaker insertion  01/10/11    by Fawn Kirk for CHB   Family History  Problem Relation Age of Onset  . Family history unknown: Yes   Social History  Substance Use Topics  . Smoking status: Former Games developer  . Smokeless tobacco: None     Comment: quit 40 years ago  . Alcohol Use: No    Review of Systems  Constitutional: Negative for fever and chills.  Musculoskeletal: Positive for arthralgias. Negative for myalgias.  Skin: Positive for color change. Negative for rash and wound.  Neurological: Negative for weakness and numbness.       Allergies  Review of patient's allergies indicates no known allergies.  Home Medications   Prior to Admission medications   Medication Sig Start Date End Date Taking? Authorizing Provider  carvedilol (COREG) 6.25 MG tablet Take 3.125 mg by mouth 2 (two) times daily.     Historical Provider, MD  Cholecalciferol (VITAMIN D) 2000 UNITS CAPS Take 2,000 Units by mouth daily.     Historical Provider, MD  diclofenac sodium (VOLTAREN) 1 % GEL Apply 1 application topically daily as needed (knee pain). 4-5 times week    Historical Provider, MD  furosemide (LASIX) 40 MG tablet Take 20 mg by mouth 3 (three) times a week. Monday, Wednesday and Friday    Historical Provider, MD  isosorbide mononitrate (IMDUR) 30 MG 24 hr tablet Take 30 mg by mouth daily.     Historical Provider, MD  levETIRAcetam (KEPPRA) 250 MG tablet Take 250 mg by mouth 2 (two) times daily.     Historical Provider, MD  lisinopril (PRINIVIL,ZESTRIL) 2.5 MG tablet TAKE 1 TABLET BY MOUTH DAILY 02/08/15   Hillis Range, MD  NITROSTAT 0.4 MG SL tablet Place 0.4 mg under the tongue as needed for chest pain.  01/17/11   Historical Provider, MD  simvastatin (ZOCOR) 40 MG tablet Take 40 mg by mouth at bedtime.      Historical Provider,  MD  XARELTO 20 MG TABS tablet TAKE 1 TABLET BY MOUTH EVERY DAY 02/14/15   Hillis RangeJames Allred, MD   BP 118/82 mmHg  Pulse 60  Temp(Src) 97.6 F (36.4 C) (Oral)  Resp 20  SpO2 97% Physical Exam  Constitutional: He appears well-developed and well-nourished.  HENT:  Head: Atraumatic.  Neck: Normal range of motion.  Cardiovascular:  Pulses:      Dorsalis pedis pulses are 2+ on the right side, and 2+ on the left side.  Pulses equal bilaterally  Musculoskeletal: He exhibits tenderness.  ttp left dorsal foot with mild edema and blanching erythema that ends at the ankle.  Skin intact.  Less than 3 sec cap refill in toes.    Neurological: He is alert. He has normal strength. He displays normal reflexes. No sensory  deficit.  Skin: Skin is warm and dry.  Onychomycosis left great toenail.  No intertriginous rash or lesions, skin intact.  No crepitus.  Psychiatric: He has a normal mood and affect.    ED Course  Procedures (including critical care time) Labs Review Labs Reviewed  CBC WITH DIFFERENTIAL/PLATELET - Abnormal; Notable for the following:    Platelets 145 (*)    All other components within normal limits  COMPREHENSIVE METABOLIC PANEL - Abnormal; Notable for the following:    Chloride 100 (*)    Glucose, Bld 100 (*)    BUN 35 (*)    Creatinine, Ser 1.33 (*)    Total Bilirubin 1.3 (*)    GFR calc non Af Amer 47 (*)    GFR calc Af Amer 55 (*)    All other components within normal limits    Imaging Review Dg Foot Complete Left  05/09/2015  CLINICAL DATA:  81 year old male with left foot pain and swelling x2 days. EXAM: LEFT FOOT - COMPLETE 3+ VIEW COMPARISON:  None. FINDINGS: No definite acute fracture or dislocation. There is irregularity of the distal fifth metatarsal which may be related to an old healed fracture or old infection. There is mild thickening of the soft tissues of the plantar aspect of the foot predominantly over the fifth MTP. Clinical correlation is recommended. No radiopaque foreign object identified. IMPRESSION: No acute fracture or dislocation. Electronically Signed   By: Elgie CollardArash  Radparvar M.D.   On: 05/09/2015 00:29   I have personally reviewed and evaluated these images and lab results as part of my medical decision-making.   EKG Interpretation None      MDM   Final diagnoses:  Edema of left foot    Patients  labs reviewed.  Radiological studies were viewed, interpreted and considered during the medical decision making and disposition process. I agree with radiologists reading.  Results were also discussed with patient.  Pt also seen by Dr Effie ShyWentz, treatment plan discussed.  Exam c/w inflammatory reaction,  Yet cannot rule out cellulitis. Pt to be treated with  prednisone taper,  Clindamycin.  Elevation, heat tx.  F/u with pcp over the next 3-5 days.  No evidence of gouty flare, no hx, not localizing to specific joint.      Burgess AmorJulie Delvina Mizzell, PA-C 05/09/15 [id number]  Mancel BaleElliott Wentz, MD 05/09/15 [phone]

## 2015-05-09 NOTE — Discharge Instructions (Signed)
Your foot pain and swelling may be due to inflammation or tendonitis or tendon injury, however, it also could be a skin infection (cellulitis).  You are being covered for both of these possible diagnoses with the medicines prescribed.  Take your next dose of prednisone tomorrow evening and your next dose of the clindamycin tomorrow morning.  Make sure to eat a serving of yogurt daily which can minimize the risk of developing diarrhea, a possible side effect of antibiotics.  Keep your foot elevated and use warm compresses as much as possible for the next several days.  Your lab tests suggest that you are mildly dehydrated.  You should concentrate on drinking more fluids to maintain healthy hydration.

## 2015-05-09 NOTE — ED Notes (Signed)
Dr Wentz at bedside,  

## 2015-05-09 NOTE — ED Provider Notes (Signed)
  Face-to-face evaluation   History: pain and swelling left foot for 2 days. No trauma  Physical exam: Left foot, tender and swollen. Mild erythema. No streaking.  Medical screening examination/treatment/procedure(s) were conducted as a shared visit with non-physician practitioner(s) and myself.  I personally evaluated the patient during the encounter   Mancel BaleElliott Cortne Amara, MD 05/09/15 [phone]

## 2015-06-18 ENCOUNTER — Other Ambulatory Visit: Payer: Self-pay | Admitting: Internal Medicine

## 2015-07-12 ENCOUNTER — Other Ambulatory Visit: Payer: Self-pay | Admitting: Internal Medicine

## 2015-07-23 ENCOUNTER — Other Ambulatory Visit: Payer: Self-pay | Admitting: Internal Medicine

## 2015-09-14 ENCOUNTER — Encounter: Payer: Medicare Other | Admitting: Internal Medicine

## 2015-10-08 ENCOUNTER — Other Ambulatory Visit: Payer: Self-pay | Admitting: Internal Medicine

## 2015-10-17 ENCOUNTER — Ambulatory Visit (INDEPENDENT_AMBULATORY_CARE_PROVIDER_SITE_OTHER): Payer: Medicare Other | Admitting: Internal Medicine

## 2015-10-17 ENCOUNTER — Other Ambulatory Visit: Payer: Self-pay | Admitting: Internal Medicine

## 2015-10-17 ENCOUNTER — Encounter: Payer: Self-pay | Admitting: Internal Medicine

## 2015-10-17 VITALS — BP 100/82 | HR 62 | Ht 68.0 in | Wt 174.0 lb

## 2015-10-17 DIAGNOSIS — Z5181 Encounter for therapeutic drug level monitoring: Secondary | ICD-10-CM

## 2015-10-17 DIAGNOSIS — Z950 Presence of cardiac pacemaker: Secondary | ICD-10-CM | POA: Diagnosis not present

## 2015-10-17 DIAGNOSIS — I519 Heart disease, unspecified: Secondary | ICD-10-CM

## 2015-10-17 DIAGNOSIS — I442 Atrioventricular block, complete: Secondary | ICD-10-CM

## 2015-10-17 DIAGNOSIS — I480 Paroxysmal atrial fibrillation: Secondary | ICD-10-CM | POA: Diagnosis not present

## 2015-10-17 DIAGNOSIS — E785 Hyperlipidemia, unspecified: Secondary | ICD-10-CM

## 2015-10-17 LAB — CUP PACEART INCLINIC DEVICE CHECK
Battery Voltage: 2.96 V
Date Time Interrogation Session: 20170607095409
Lead Channel Impedance Value: 612.5 Ohm
Lead Channel Pacing Threshold Amplitude: 0.75 V
Lead Channel Pacing Threshold Amplitude: 0.75 V
Lead Channel Pacing Threshold Pulse Width: 0.5 ms
Lead Channel Pacing Threshold Pulse Width: 0.5 ms
MDC IDC LEAD IMPLANT DT: 20120831
MDC IDC LEAD LOCATION: 753860
MDC IDC LEAD MODEL: 1948
MDC IDC MSMT BATTERY REMAINING LONGEVITY: 120 mo
MDC IDC SET LEADCHNL RV PACING AMPLITUDE: 2.5 V
MDC IDC SET LEADCHNL RV PACING PULSEWIDTH: 0.5 ms
MDC IDC SET LEADCHNL RV SENSING SENSITIVITY: 2 mV
MDC IDC STAT BRADY RV PERCENT PACED: 87 %
Pulse Gen Model: 1210
Pulse Gen Serial Number: 2667494

## 2015-10-17 NOTE — Progress Notes (Signed)
[name], MD is PCP  The patient presents today for routine electrophysiology followup.  Since his last visit, the patient reports doing very well.  He has done well since I saw him last.  CAD is stable.  COPD is stable.  Tolerating anticoagulation for afib.  Today, he denies symptoms of palpitations, chest pain, shortness of breath, dizziness, presyncope, syncope, bleeding or neurologic sequela.  The patient feels that he is tolerating medications without difficulties and is otherwise without complaint today.   Past Medical History  Diagnosis Date  . COPD (chronic obstructive pulmonary disease) (HCC)   . Hyperlipidemia   . Cardiomyopathy, ischemic     EF 40% by echo 1/11  . CVD (cardiovascular disease)     s/p MCA stroke 1/11  . HTN (hypertension)   . Seizure disorder (HCC)   . Permanent atrial fibrillation (HCC)   . Complete heart block (HCC)     s/p SJM PPM by JSA 01/10/11  . CAD (coronary artery disease)     by cath 1/11, medical management advised  . Stroke [facility])    Past Surgical History  Procedure Laterality Date  . Pacemaker insertion  01/10/11    by JA for CHB    Current Outpatient Prescriptions  Medication Sig Dispense Refill  . carvedilol (COREG) 6.25 MG tablet Take 3.125 mg by mouth 2 (two) times daily.     . Cholecalciferol (VITAMIN D) 2000 UNITS CAPS Take 2,000 Units by mouth daily.     . diclofenac sodium (VOLTAREN) 1 % GEL Apply 1 application topically daily as needed (knee pain). 4-5 times week    . furosemide (LASIX) 40 MG tablet Take 20 mg by mouth 3 (three) times a week. Monday, Wednesday and Friday    . isosorbide mononitrate (IMDUR) 30 MG 24 hr tablet Take 30 mg by mouth daily.     Marland Kitchen levETIRAcetam (KEPPRA) 250 MG tablet Take 250 mg by mouth 2 (two) times daily.     Marland Kitchen lisinopril (PRINIVIL,ZESTRIL) 2.5 MG tablet TAKE 1 TABLET BY MOUTH DAILY 30 tablet 6  . NITROSTAT 0.4 MG SL tablet Place 0.4 mg under the tongue as needed for chest pain.     .  simvastatin (ZOCOR) 40 MG tablet Take 40 mg by mouth at bedtime.      Carlena Hurl 20 MG TABS tablet TAKE 1 TABLET BY MOUTH DAILY 30 tablet 2   No current facility-administered medications for this visit.    Social History   Social History  . Marital Status: Single    Spouse Name: N/A  . Number of Children: N/A  . Years of Education: N/A   Occupational History  . Retired    Social History Main Topics  . Smoking status: Former Games developer  . Smokeless tobacco: Never Used     Comment: quit 40 years ago  . Alcohol Use: No  . Drug Use: No  . Sexual Activity: Not on file   Other Topics Concern  . Not on file   Social History Narrative   Lives in [place] with family.   ROS- all systems are reviewed and negative except as per HPI above  Physical Exam: Filed Vitals:   10/17/15 0806  BP: 100/82  Pulse: 62  Height:  (1.727 m)  Weight: 174 lb (78.926 kg)  SpO2: 99%    GEN- The patient is well appearing, alert and oriented x 3 today.   Head- normocephalic, atraumatic Eyes-  Sclera clear, conjunctiva pink Ears- hearing  intact Oropharynx- clear Neck- supple, no JVP Lymph- no cervical lymphadenopathy Lungs- Clear to ausculation bilaterally, normal work of breathing Chest- pacemaker pocket is healing nicely Heart- Regular rate and rhythm (paced),  GI- soft, NT, ND, + BS Extremities- no clubbing, cyanosis, or edema Walks with a cane  Pacemaker interrogation- reviewed in detail today,  See PACEART report  Assessment and Plan:  1. Complete heart block Normal pacemaker function See Pace Art report No changes today  2. Permanent afib Rate controlled chads2vasc is at least 7 Continue xarelto Bmet, cbc Would like for Misty StanleyLisa to see in anticoagulation clinic in 6 months  3. CAD No ischemic symptoms No changes today  4. HTN Stable No change required today  5. Chronic systolic dysfunction euvolemic Continue current medicine regimen  Merlin Follow-up in  anticoagulation in 6 months I will see in a year  Hillis RangeJames Elayah Klooster MD, [facility] 10/17/2015 9:15 AM

## 2015-10-17 NOTE — Addendum Note (Signed)
Addended by: Eustace MooreANDERSON, Imir Brumbach M on: 10/17/2015 09:25 AM   Modules accepted: Orders

## 2015-10-17 NOTE — Patient Instructions (Addendum)
Your physician recommends that you continue on your current medications as directed. Please refer to the Current Medication list given to you today. Your physician recommends that you return for a FASTING lipid/cmet, & cbc as soon as possible. Please make sure you are fasting at least 8 hours. You have been referred to the anticoagulation clinic in 6 months. You will see Vashti HeyLisa Reid. Device check on 01/16/16. Your physician recommends that you schedule a follow-up appointment in: 1 year. Please schedule this appointment today before leaving the office.

## 2015-11-05 ENCOUNTER — Other Ambulatory Visit: Payer: Self-pay | Admitting: *Deleted

## 2015-11-05 NOTE — Telephone Encounter (Signed)
Mady Gemmaugene P Opdahl  10/08/2015  Refill  MRN:  [id number]   Description: 82 year old male  Provider: Hillis RangeJames Allred, MD  Department: [facility]       Call Documentation     No notes of this type exist for this encounter.     Encounter MyChart Messages     No messages in this encounter     Approved      Disp Refills Start End    lisinopril (PRINIVIL,ZESTRIL) 2.5 MG tablet 30 tablet 6 10/09/2015     Sig:  TAKE 1 TABLET BY MOUTH DAILY    Class:  Normal    DAW:  No    Authorizing Provider:  Hillis RangeJames Allred, MD    Ordering User:  Eustace MooreLydia M Anderson, LPN      Visit Pharmacy     [phone] [place] - [address] AT [facility] & [address]   Mady Gemmaugene P Castello  10/17/2015  Refill  MRN:  [id number]   Description: 82 year old male  Provider: Hillis RangeJames Allred, MD  Department: [facility]       Call Documentation     No notes of this type exist for this encounter.     Encounter MyChart Messages     No messages in this encounter     Approved      Disp Refills Start End    XARELTO 20 MG TABS tablet 30 tablet 11 10/17/2015     Sig:  TAKE 1 TABLET BY MOUTH DAILY    Class:  Normal    DAW:  No    Authorizing Provider:  Hillis RangeJames Allred, MD    Ordering User:  Eustace MooreLydia M Anderson, LPN      Visit Pharmacy     [facility] [phone] - [place], [place] - [address] AT [facility] [address] & [address]

## 2015-11-07 ENCOUNTER — Other Ambulatory Visit: Payer: Self-pay | Admitting: *Deleted

## 2015-11-07 MED ORDER — RIVAROXABAN 20 MG PO TABS
20.0000 mg | ORAL_TABLET | Freq: Every day | ORAL | Status: DC
Start: 2015-11-07 — End: 2016-08-24

## 2015-11-07 MED ORDER — LISINOPRIL 2.5 MG PO TABS: 2.5000 mg | ORAL_TABLET | Freq: Every day | ORAL | Status: DC

## 2015-12-20 ENCOUNTER — Telehealth: Payer: Self-pay | Admitting: *Deleted

## 2016-01-16 ENCOUNTER — Ambulatory Visit (INDEPENDENT_AMBULATORY_CARE_PROVIDER_SITE_OTHER): Payer: Medicare Other | Admitting: *Deleted

## 2016-01-16 DIAGNOSIS — I442 Atrioventricular block, complete: Secondary | ICD-10-CM

## 2016-01-16 DIAGNOSIS — Z950 Presence of cardiac pacemaker: Secondary | ICD-10-CM

## 2016-01-16 NOTE — Progress Notes (Signed)
Remote pacemaker transmission.   

## 2016-01-18 ENCOUNTER — Encounter: Payer: Self-pay | Admitting: Cardiology

## 2016-01-25 LAB — CUP PACEART REMOTE DEVICE CHECK
Battery Remaining Longevity: 124 mo
Implantable Lead Location: 753860
Lead Channel Pacing Threshold Amplitude: 0.75 V
Lead Channel Pacing Threshold Pulse Width: 0.5 ms
Lead Channel Setting Pacing Amplitude: 2.5 V
Lead Channel Setting Pacing Pulse Width: 0.5 ms
MDC IDC LEAD IMPLANT DT: 20120831
MDC IDC LEAD MODEL: 1948
MDC IDC MSMT BATTERY REMAINING PERCENTAGE: 95.5 %
MDC IDC MSMT BATTERY VOLTAGE: 2.96 V
MDC IDC MSMT LEADCHNL RV IMPEDANCE VALUE: 600 Ohm
MDC IDC MSMT LEADCHNL RV SENSING INTR AMPL: 12 mV
MDC IDC PG SERIAL: 2667494
MDC IDC SESS DTM: 20170906085941
MDC IDC SET LEADCHNL RV SENSING SENSITIVITY: 2 mV
MDC IDC STAT BRADY RV PERCENT PACED: 98 %

## 2016-02-01 ENCOUNTER — Encounter: Payer: Self-pay | Admitting: Internal Medicine

## 2016-02-18 ENCOUNTER — Telehealth: Payer: Self-pay | Admitting: *Deleted

## 2016-02-18 NOTE — Telephone Encounter (Signed)
Result available dated 02/01/16.

## 2016-02-18 NOTE — Telephone Encounter (Signed)
see 02/01/16 results

## 2016-04-16 ENCOUNTER — Ambulatory Visit (INDEPENDENT_AMBULATORY_CARE_PROVIDER_SITE_OTHER): Payer: Medicare Other | Admitting: *Deleted

## 2016-04-16 DIAGNOSIS — I442 Atrioventricular block, complete: Secondary | ICD-10-CM

## 2016-04-16 NOTE — Progress Notes (Signed)
Remote pacemaker transmission.   

## 2016-04-17 ENCOUNTER — Ambulatory Visit (INDEPENDENT_AMBULATORY_CARE_PROVIDER_SITE_OTHER): Payer: Medicare Other | Admitting: *Deleted

## 2016-04-17 DIAGNOSIS — I4891 Unspecified atrial fibrillation: Secondary | ICD-10-CM

## 2016-04-17 NOTE — Progress Notes (Signed)
Pt was started on Xarelto 20mg  daily for atrial fibrillation 06/2014. Marland Kitchen.    Reviewed patients medication list.  Pt is not currently on any combined P-gp and strong CYP3A4 inhibitors/inducers (ketoconazole, traconazole, ritonavir, carbamazepine, phenytoin, rifampin, St. John's wort).  Reviewed labs.  SCr , Weight 79kg, CrCl  .  Dose is appropriate  based on CrCl.   Hgb and HCT   A full discussion of the nature of anticoagulants has been carried out.  A benefit/risk analysis has been presented to the patient, so that they understand the justification for choosing anticoagulation with Xarelto at this time.  The need for compliance is stressed.  Pt is aware to take the medication once daily with the largest meal of the day.  Side effects of potential bleeding are discussed, including unusual colored urine or stools, coughing up blood or coffee ground emesis, nose bleeds or serious fall or head trauma.  Discussed signs and symptoms of stroke. The patient should avoid any OTC items containing aspirin or ibuprofen.  Avoid alcohol consumption.   Call if any signs of abnormal bleeding.  Discussed financial obligations and resolved any difficulty in obtaining medication.  Next lab test test in 6 months.   Pt is scheduled to have lab work done 05/01/16 by Dr Dennison [facility]ters [facility].  Orders for CBC and BMP given to pt's daughter to have drawn with labs on 12/21.  Will call pt when labs are reviewed.

## 2016-04-23 ENCOUNTER — Encounter: Payer: Self-pay | Admitting: Cardiology

## 2016-05-13 LAB — CUP PACEART REMOTE DEVICE CHECK
Battery Remaining Longevity: 124 mo
Implantable Lead Implant Date: 20120831
Implantable Lead Model: 1948
Lead Channel Impedance Value: 600 Ohm
Lead Channel Pacing Threshold Amplitude: 0.75 V
Lead Channel Setting Pacing Amplitude: 2.5 V
Lead Channel Setting Pacing Pulse Width: 0.5 ms
MDC IDC LEAD LOCATION: 753860
MDC IDC MSMT BATTERY REMAINING PERCENTAGE: 95.5 %
MDC IDC MSMT BATTERY VOLTAGE: 2.96 V
MDC IDC MSMT LEADCHNL RV PACING THRESHOLD PULSEWIDTH: 0.5 ms
MDC IDC MSMT LEADCHNL RV SENSING INTR AMPL: 12 mV
MDC IDC PG IMPLANT DT: 20120831
MDC IDC PG SERIAL: 2667494
MDC IDC SESS DTM: 20171206070024
MDC IDC SET LEADCHNL RV SENSING SENSITIVITY: 2 mV
MDC IDC STAT BRADY RV PERCENT PACED: 96 %

## 2016-07-16 ENCOUNTER — Ambulatory Visit (INDEPENDENT_AMBULATORY_CARE_PROVIDER_SITE_OTHER): Payer: Medicare Other | Admitting: *Deleted

## 2016-07-16 DIAGNOSIS — I442 Atrioventricular block, complete: Secondary | ICD-10-CM

## 2016-07-16 NOTE — Progress Notes (Signed)
Remote pacemaker transmission.   

## 2016-07-17 ENCOUNTER — Encounter: Payer: Self-pay | Admitting: Cardiology

## 2016-07-18 LAB — CUP PACEART REMOTE DEVICE CHECK
Battery Remaining Longevity: 114 mo
Battery Remaining Percentage: 91 %
Implantable Lead Implant Date: 20120831
Implantable Lead Location: 753860
Implantable Lead Model: 1948
Implantable Pulse Generator Implant Date: 20120831
Lead Channel Pacing Threshold Amplitude: 0.75 V
Lead Channel Pacing Threshold Pulse Width: 0.5 ms
Lead Channel Setting Sensing Sensitivity: 2 mV
MDC IDC MSMT BATTERY VOLTAGE: 2.95 V
MDC IDC MSMT LEADCHNL RV IMPEDANCE VALUE: 590 Ohm
MDC IDC MSMT LEADCHNL RV SENSING INTR AMPL: 12 mV
MDC IDC PG SERIAL: 2667494
MDC IDC SESS DTM: 20180307080613
MDC IDC SET LEADCHNL RV PACING AMPLITUDE: 2.5 V
MDC IDC SET LEADCHNL RV PACING PULSEWIDTH: 0.5 ms
MDC IDC STAT BRADY RV PERCENT PACED: 96 %

## 2016-08-24 ENCOUNTER — Other Ambulatory Visit: Payer: Self-pay | Admitting: Internal Medicine

## 2016-10-10 ENCOUNTER — Ambulatory Visit: Payer: Medicare Other | Admitting: Internal Medicine

## 2016-10-12 ENCOUNTER — Other Ambulatory Visit: Payer: Self-pay | Admitting: Internal Medicine

## 2016-10-31 ENCOUNTER — Encounter: Payer: Self-pay | Admitting: Internal Medicine

## 2016-10-31 ENCOUNTER — Ambulatory Visit (INDEPENDENT_AMBULATORY_CARE_PROVIDER_SITE_OTHER): Payer: Medicare Other | Admitting: Internal Medicine

## 2016-10-31 VITALS — BP 118/74 | HR 61 | Ht 68.0 in | Wt 169.0 lb

## 2016-10-31 DIAGNOSIS — I442 Atrioventricular block, complete: Secondary | ICD-10-CM | POA: Diagnosis not present

## 2016-10-31 DIAGNOSIS — I519 Heart disease, unspecified: Secondary | ICD-10-CM

## 2016-10-31 DIAGNOSIS — I482 Chronic atrial fibrillation: Secondary | ICD-10-CM

## 2016-10-31 DIAGNOSIS — I4821 Permanent atrial fibrillation: Secondary | ICD-10-CM

## 2016-10-31 NOTE — Patient Instructions (Signed)
Medication Instructions:  Continue all current medications.  Labwork: none  Testing/Procedures: none  Follow-Up: Your physician wants you to follow up in:  1 year.  You will receive a reminder letter in the mail one-two months in advance.  If you don't receive a letter, please call our office to schedule the follow up appointment   Any Other Special Instructions Will Be Listed Below (If Applicable). Remote monitoring is used to monitor your Pacemaker of ICD from home. This monitoring reduces the number of office visits required to check your device to one time per year. It allows us to keep an eye on the functioning of your device to ensure it is working properly. You are scheduled for a device check from home on 02-02-2017. You may send your transmission at any time that day. If you have a wireless device, the transmission will be sent automatically. After your physician reviews your transmission, you will receive a postcard with your next transmission date.  If you need a refill on your cardiac medications before your next appointment, please call your pharmacy.  

## 2016-10-31 NOTE — Progress Notes (Signed)
PCP: Daniel Brown, Daniel Brown, Daniel Brown (Inactive)  Daniel Brown is a 83 y.o. male who presents today for routine electrophysiology followup.  Since last being seen in our clinic, the patient reports doing very well.  No concerns today.  Doing daily exercise.  Enjoys sports.  Today, he denies symptoms of palpitations, chest pain, shortness of breath,  lower extremity edema, dizziness, presyncope, or syncope.  The patient is otherwise without complaint today.   Past Medical History:  Diagnosis Date  . CAD (coronary artery disease)    by cath 1/11, medical management advised  . Cardiomyopathy, ischemic    EF 40% by echo 1/11  . Complete heart block (HCC)    s/p SJM PPM by JSA 01/10/11  . COPD (chronic obstructive pulmonary disease) (HCC)   . CVD (cardiovascular disease)    s/p MCA stroke 1/11  . HTN (hypertension)   . Hyperlipidemia   . Permanent atrial fibrillation (HCC)   . Seizure disorder (HCC)   . Stroke [facility](HCC)    Past Surgical History:  Procedure Laterality Date  . PACEMAKER INSERTION  01/10/11   by Fawn KirkJA for CHB    ROS- all systems are reviewed and negative except as per HPI above  Current Outpatient Prescriptions  Medication Sig Dispense Refill  . carvedilol (COREG) 6.25 MG tablet Take 3.125 mg by mouth 2 (two) times daily.     . Cholecalciferol (VITAMIN D) 2000 UNITS CAPS Take 2,000 Units by mouth daily.     Marland Kitchen. ezetimibe (ZETIA) 10 MG tablet Take 10 mg by mouth daily.    . furosemide (LASIX) 40 MG tablet Take 20 mg by mouth 3 (three) times a week. Monday, Wednesday and Friday    . isosorbide mononitrate (IMDUR) 30 MG 24 hr tablet Take 30 mg by mouth daily.     Marland Kitchen. levETIRAcetam (KEPPRA) 250 MG tablet Take 250 mg by mouth 2 (two) times daily.     Marland Kitchen. lisinopril (PRINIVIL,ZESTRIL) 2.5 MG tablet TAKE 1 TABLET BY MOUTH  DAILY 90 tablet 0  . NITROSTAT 0.4 MG SL tablet Place 0.4 mg under the tongue as needed for chest pain.     Marland Kitchen. XARELTO 20 MG TABS tablet TAKE 1 TABLET BY MOUTH  DAILY 90 tablet 3    No current facility-administered medications for this visit.     Physical Exam: Vitals:   10/31/16 0905  BP: 118/74  Pulse: 61  SpO2: 99%  Weight: 169 lb (76.7 kg)  Height: 5\' 8"  (1.727 m)    GEN- The patient is well appearing, alert and oriented x 3 today.   Head- normocephalic, atraumatic Eyes-  Sclera clear, conjunctiva pink Ears- hearing intact Oropharynx- clear Lungs- Clear to ausculation bilaterally, normal work of breathing Chest- pacemaker pocket is well healed Heart- Regular rate and rhythm (paced) GI- soft, NT, ND, + BS Extremities- no clubbing, cyanosis, or edema  Pacemaker interrogation- reviewed in detail today,  See PACEART report  Assessment and Plan:  1. Complete heart block Normal pacemaker function See Pace Art report No changes today  2. Permanent afib Rare controlled On xarelto He prefers PCP to follow bmet, cbc twice per year No issues with bleeding  3. CAD No ischemic symptoms Stable No change required today  4. HTN Stable No change required today  5. Chronic systolic dysfunction Last echo 2012 reveals EF 45-50% No changes today  Merlin Return to see me in a year unless problems arise  Hillis RangeJames Amiyah Brown Daniel Brown, [facility] 10/31/2016 9:33 AM

## 2016-11-03 LAB — CUP PACEART INCLINIC DEVICE CHECK
Battery Remaining Longevity: 117 mo
Battery Voltage: 2.96 V
Brady Statistic RV Percent Paced: 97 %
Date Time Interrogation Session: 20180622131945
Lead Channel Impedance Value: 600 Ohm
Lead Channel Pacing Threshold Pulse Width: 0.5 ms
Lead Channel Sensing Intrinsic Amplitude: 12 mV
Lead Channel Setting Pacing Amplitude: 2.5 V
Lead Channel Setting Sensing Sensitivity: 2 mV
MDC IDC LEAD IMPLANT DT: 20120831
MDC IDC LEAD LOCATION: 753860
MDC IDC MSMT LEADCHNL RV PACING THRESHOLD AMPLITUDE: 0.75 V
MDC IDC MSMT LEADCHNL RV PACING THRESHOLD AMPLITUDE: 0.75 V
MDC IDC MSMT LEADCHNL RV PACING THRESHOLD PULSEWIDTH: 0.5 ms
MDC IDC PG IMPLANT DT: 20120831
MDC IDC PG SERIAL: 2667494
MDC IDC SET LEADCHNL RV PACING PULSEWIDTH: 0.5 ms
Pulse Gen Model: 1210

## 2017-01-10 ENCOUNTER — Other Ambulatory Visit: Payer: Self-pay | Admitting: Internal Medicine

## 2017-02-02 ENCOUNTER — Ambulatory Visit (INDEPENDENT_AMBULATORY_CARE_PROVIDER_SITE_OTHER): Payer: Medicare Other | Admitting: *Deleted

## 2017-02-02 DIAGNOSIS — I442 Atrioventricular block, complete: Secondary | ICD-10-CM | POA: Diagnosis not present

## 2017-02-02 NOTE — Progress Notes (Signed)
Remote pacemaker transmission.   

## 2017-02-05 ENCOUNTER — Encounter: Payer: Self-pay | Admitting: Cardiology

## 2017-02-05 LAB — CUP PACEART REMOTE DEVICE CHECK
Battery Remaining Percentage: 91 %
Implantable Lead Implant Date: 20120831
Implantable Pulse Generator Implant Date: 20120831
Lead Channel Impedance Value: 590 Ohm
Lead Channel Pacing Threshold Amplitude: 0.75 V
Lead Channel Pacing Threshold Pulse Width: 0.5 ms
Lead Channel Sensing Intrinsic Amplitude: 12 mV
Lead Channel Setting Sensing Sensitivity: 2 mV
MDC IDC LEAD LOCATION: 753860
MDC IDC MSMT BATTERY REMAINING LONGEVITY: 114 mo
MDC IDC MSMT BATTERY VOLTAGE: 2.95 V
MDC IDC SESS DTM: 20180924071136
MDC IDC SET LEADCHNL RV PACING AMPLITUDE: 2.5 V
MDC IDC SET LEADCHNL RV PACING PULSEWIDTH: 0.5 ms
MDC IDC STAT BRADY RV PERCENT PACED: 98 %
Pulse Gen Model: 1210
Pulse Gen Serial Number: 2667494

## 2017-02-23 ENCOUNTER — Emergency Department (HOSPITAL_COMMUNITY): Payer: Medicare Other

## 2017-02-23 ENCOUNTER — Encounter (HOSPITAL_COMMUNITY): Payer: Self-pay

## 2017-02-23 ENCOUNTER — Inpatient Hospital Stay (HOSPITAL_COMMUNITY)
Admission: EM | Admit: 2017-02-23 | Discharge: 2017-03-03 | DRG: 683 | Disposition: A | Discharge status: Skilled Nursing Facility | Payer: Medicare Other | Attending: Family Medicine | Admitting: Family Medicine

## 2017-02-23 DIAGNOSIS — N390 Urinary tract infection, site not specified: Secondary | ICD-10-CM | POA: Diagnosis present

## 2017-02-23 DIAGNOSIS — K8050 Calculus of bile duct without cholangitis or cholecystitis without obstruction: Secondary | ICD-10-CM | POA: Diagnosis present

## 2017-02-23 DIAGNOSIS — E785 Hyperlipidemia, unspecified: Secondary | ICD-10-CM | POA: Diagnosis present

## 2017-02-23 DIAGNOSIS — Z66 Do not resuscitate: Secondary | ICD-10-CM | POA: Diagnosis present

## 2017-02-23 DIAGNOSIS — N179 Acute kidney failure, unspecified: Principal | ICD-10-CM

## 2017-02-23 DIAGNOSIS — M6282 Rhabdomyolysis: Secondary | ICD-10-CM

## 2017-02-23 DIAGNOSIS — D6959 Other secondary thrombocytopenia: Secondary | ICD-10-CM | POA: Diagnosis present

## 2017-02-23 DIAGNOSIS — Z950 Presence of cardiac pacemaker: Secondary | ICD-10-CM | POA: Diagnosis not present

## 2017-02-23 DIAGNOSIS — I2510 Atherosclerotic heart disease of native coronary artery without angina pectoris: Secondary | ICD-10-CM | POA: Diagnosis present

## 2017-02-23 DIAGNOSIS — R945 Abnormal results of liver function studies: Secondary | ICD-10-CM | POA: Diagnosis not present

## 2017-02-23 DIAGNOSIS — I10 Essential (primary) hypertension: Secondary | ICD-10-CM | POA: Diagnosis present

## 2017-02-23 DIAGNOSIS — I69351 Hemiplegia and hemiparesis following cerebral infarction affecting right dominant side: Secondary | ICD-10-CM | POA: Diagnosis not present

## 2017-02-23 DIAGNOSIS — Y92002 Bathroom of unspecified non-institutional (private) residence single-family (private) house as the place of occurrence of the external cause: Secondary | ICD-10-CM

## 2017-02-23 DIAGNOSIS — W182XXA Fall in (into) shower or empty bathtub, initial encounter: Secondary | ICD-10-CM | POA: Diagnosis not present

## 2017-02-23 DIAGNOSIS — L8990 Pressure ulcer of unspecified site, unspecified stage: Secondary | ICD-10-CM | POA: Insufficient documentation

## 2017-02-23 DIAGNOSIS — Z7901 Long term (current) use of anticoagulants: Secondary | ICD-10-CM

## 2017-02-23 DIAGNOSIS — T796XXA Traumatic ischemia of muscle, initial encounter: Secondary | ICD-10-CM | POA: Diagnosis present

## 2017-02-23 DIAGNOSIS — S80811A Abrasion, right lower leg, initial encounter: Secondary | ICD-10-CM | POA: Diagnosis present

## 2017-02-23 DIAGNOSIS — N3000 Acute cystitis without hematuria: Secondary | ICD-10-CM

## 2017-02-23 DIAGNOSIS — R748 Abnormal levels of other serum enzymes: Secondary | ICD-10-CM | POA: Diagnosis not present

## 2017-02-23 DIAGNOSIS — I482 Chronic atrial fibrillation: Secondary | ICD-10-CM

## 2017-02-23 DIAGNOSIS — J449 Chronic obstructive pulmonary disease, unspecified: Secondary | ICD-10-CM | POA: Diagnosis present

## 2017-02-23 DIAGNOSIS — I255 Ischemic cardiomyopathy: Secondary | ICD-10-CM | POA: Diagnosis present

## 2017-02-23 DIAGNOSIS — G40909 Epilepsy, unspecified, not intractable, without status epilepticus: Secondary | ICD-10-CM | POA: Diagnosis present

## 2017-02-23 DIAGNOSIS — R778 Other specified abnormalities of plasma proteins: Secondary | ICD-10-CM

## 2017-02-23 DIAGNOSIS — E860 Dehydration: Secondary | ICD-10-CM | POA: Diagnosis present

## 2017-02-23 DIAGNOSIS — I77811 Abdominal aortic ectasia: Secondary | ICD-10-CM | POA: Diagnosis present

## 2017-02-23 DIAGNOSIS — Z87891 Personal history of nicotine dependence: Secondary | ICD-10-CM | POA: Diagnosis not present

## 2017-02-23 DIAGNOSIS — R7989 Other specified abnormal findings of blood chemistry: Secondary | ICD-10-CM

## 2017-02-23 DIAGNOSIS — W19XXXA Unspecified fall, initial encounter: Secondary | ICD-10-CM

## 2017-02-23 DIAGNOSIS — I4891 Unspecified atrial fibrillation: Secondary | ICD-10-CM | POA: Diagnosis present

## 2017-02-23 LAB — COMPREHENSIVE METABOLIC PANEL
ALT: 326 U/L — ABNORMAL HIGH (ref 17–63)
ANION GAP: 16 — AB (ref 5–15)
AST: 517 U/L — ABNORMAL HIGH (ref 15–41)
Albumin: 3.9 g/dL (ref 3.5–5.0)
Alkaline Phosphatase: 276 U/L — ABNORMAL HIGH (ref 38–126)
BILIRUBIN TOTAL: 3.2 mg/dL — AB (ref 0.3–1.2)
BUN: 43 mg/dL — ABNORMAL HIGH (ref 6–20)
CO2: 20 mmol/L — ABNORMAL LOW (ref 22–32)
Calcium: 9.3 mg/dL (ref 8.9–10.3)
Chloride: 101 mmol/L (ref 101–111)
Creatinine, Ser: 2.41 mg/dL — ABNORMAL HIGH (ref 0.61–1.24)
GFR calc Af Amer: 26 mL/min — ABNORMAL LOW (ref >60)
GFR, EST NON AFRICAN AMERICAN: 23 mL/min — AB (ref >60)
Glucose, Bld: 133 mg/dL — ABNORMAL HIGH (ref 65–99)
POTASSIUM: 4.5 mmol/L (ref 3.5–5.1)
Sodium: 137 mmol/L (ref 135–145)
TOTAL PROTEIN: 8.1 g/dL (ref 6.5–8.1)

## 2017-02-23 LAB — URINALYSIS, ROUTINE W REFLEX MICROSCOPIC
Bilirubin Urine: NEGATIVE
Glucose, UA: NEGATIVE mg/dL
Ketones, ur: NEGATIVE mg/dL
Nitrite: NEGATIVE
PROTEIN: 100 mg/dL — AB
Specific Gravity, Urine: 1.014 (ref 1.005–1.030)
pH: 5 (ref 5.0–8.0)

## 2017-02-23 LAB — CK: CK TOTAL: 14105 U/L — AB (ref 49–397)

## 2017-02-23 LAB — CBC WITH DIFFERENTIAL/PLATELET
BASOS PCT: 0 %
Basophils Absolute: 0 10*3/uL (ref 0.0–0.1)
EOS PCT: 0 %
Eosinophils Absolute: 0 10*3/uL (ref 0.0–0.7)
HEMATOCRIT: 48.8 % (ref 39.0–52.0)
Hemoglobin: 16.7 g/dL (ref 13.0–17.0)
LYMPHS PCT: 6 %
Lymphs Abs: 0.9 10*3/uL (ref 0.7–4.0)
MCH: 32.2 pg (ref 26.0–34.0)
MCHC: 34.2 g/dL (ref 30.0–36.0)
MCV: 94 fL (ref 78.0–100.0)
MONO ABS: 1 10*3/uL (ref 0.1–1.0)
MONOS PCT: 6 %
NEUTROS ABS: 14.8 10*3/uL — AB (ref 1.7–7.7)
Neutrophils Relative %: 88 %
PLATELETS: 155 10*3/uL (ref 150–400)
RBC: 5.19 MIL/uL (ref 4.22–5.81)
RDW: 14.2 % (ref 11.5–15.5)
WBC: 16.7 10*3/uL — ABNORMAL HIGH (ref 4.0–10.5)

## 2017-02-23 LAB — PROTIME-INR
INR: 4.19 — AB
PROTHROMBIN TIME: 42.8 s — AB (ref 11.4–15.2)

## 2017-02-23 LAB — APTT: aPTT: 46 seconds — ABNORMAL HIGH (ref 24–36)

## 2017-02-23 LAB — LIPASE, BLOOD: LIPASE: 25 U/L (ref 11–51)

## 2017-02-23 LAB — TROPONIN I: TROPONIN I: 0.49 ng/mL — AB (ref <0.03)

## 2017-02-23 MED ORDER — SODIUM CHLORIDE 0.9 % IV BOLUS (SEPSIS)
500.0000 mL | Freq: Once | INTRAVENOUS | Status: AC
  Administered 2017-02-23: 500 mL via INTRAVENOUS

## 2017-02-23 NOTE — ED Triage Notes (Signed)
Pt fell this morning trying to get to the bathroom. Pt does live alone and states to have fallen at 2am. Pt.'s family found him on the floor upon arrival to home. Pt is complaining of right sided leg pain as a 10/10. Pt has had a stroke in 2010 and is progressively getting weaker on the right side.

## 2017-02-23 NOTE — ED Notes (Signed)
Was not able to obtain temperature from patient after multiple tries. Will check temperature once pt is in a room

## 2017-02-23 NOTE — H&P (Addendum)
TRH H&P   Patient Demographics:    Daniel Brown, is a 83 y.o. male  MRN: [id number]   DOB - 04-05-1928  Admit Date - 02/23/2017  Outpatient Primary MD for the patient is [name], MD (Inactive)  Referring MD/NP/PA: Loren Racer  Outpatient Specialists:  Hillis Range (cardiology),  NO GI per family   Patient coming from: home  Chief Complaint  Patient presents with  . Fall      HPI:    Daniel Brown  is a 83 y.o. male, w CVA, seizure do, Pafib, CAD, Cardiomyopathy (EF 40%), apparently was found down at home. He apparently voice a mechanical fall and was on the floor for about 7 hours.  Per family patient has been having increasing right sided weakness since time of his stroke.  Pt was brought to ED for evaluation of fall.   In ED Wbc 16.7,Hgb 16.7, Plt 155 Bun 43, Creatinine 2.41 Trop 0.49, CPK 14,105  ua WBC 6-30  Pt will be admitted for fall and acute renal failure secondary to rhabdomyolysis, and UTI and abnormal liver function and choledocholithiasis for which the ED consulted GI (Dr. Kendell Bane)     Review of systems:    In addition to the HPI above, No Fever-chills, No Headache, No changes with Vision or hearing, No problems swallowing food or Liquids, No Chest pain, Cough or Shortness of Breath, No Abdominal pain, No Nausea or Vommitting, Bowel movements are regular, No Blood in stool or Urine, No dysuria, No new skin rashes or bruises, No new joints pains-aches,  No new weakness, tingling, numbness in any extremity, No recent weight gain or loss, No polyuria, polydypsia or polyphagia, No significant Mental Stressors.  A full 10 point Review of Systems was done, except as stated above, all other Review of Systems were negative.   With Past History of the following :    Past Medical History:  Diagnosis Date  . CAD (coronary artery disease)     by cath 1/11, medical management advised  . Cardiomyopathy, ischemic    EF 40% by echo 1/11  . Complete heart block (HCC)    s/p SJM PPM by JSA 01/10/11  . COPD (chronic obstructive pulmonary disease) (HCC)   . CVD (cardiovascular disease)    s/p MCA stroke 1/11  . HTN (hypertension)   . Hyperlipidemia   . Permanent atrial fibrillation (HCC)   . Seizure disorder (HCC)   . Stroke [facility])       Past Surgical History:  Procedure Laterality Date  . PACEMAKER INSERTION  01/10/11   by Fawn Kirk for CHB      Social History:     Social History  Substance Use Topics  . Smoking status: Former Games developer  . Smokeless tobacco: Never Used     Comment: quit 40 years ago  . Alcohol use No     Lives - at  home  Mobility - walks by self   Family History :     Family History  Problem Relation Age of Onset  . Family history unknown: Yes      Home Medications:   Prior to Admission medications   Medication Sig Start Date End Date Taking? Authorizing Provider  carvedilol (COREG) 3.125 MG tablet Take 3.125 mg by mouth 2 (two) times daily.    Yes [provider]  Cholecalciferol (VITAMIN D) 2000 UNITS CAPS Take 2,000 Units by mouth daily.    Yes [provider]  ezetimibe (ZETIA) 10 MG tablet Take 10 mg by mouth daily.   Yes [provider]  furosemide (LASIX) 40 MG tablet Take 20 mg by mouth 3 (three) times a week. Monday, Wednesday and Friday   Yes [provider]  isosorbide mononitrate (IMDUR) 30 MG 24 hr tablet Take 30 mg by mouth daily.    Yes [provider]  levETIRAcetam (KEPPRA) 250 MG tablet Take 250 mg by mouth 2 (two) times daily.    Yes [provider]  lisinopril (PRINIVIL,ZESTRIL) 2.5 MG tablet Take 1 tablet (2.5 mg total) by mouth daily. 01/13/17  Yes Allred, Fayrene Fearing, MD  NITROSTAT 0.4 MG SL tablet Place 0.4 mg under the tongue as needed for chest pain.  01/17/11  Yes [provider]  XARELTO 20 MG TABS tablet TAKE 1  TABLET BY MOUTH  DAILY 08/25/16  Yes Allred, Fayrene Fearing, MD     Allergies:    No Known Allergies   Physical Exam:   Vitals  Blood pressure 119/73, pulse (!) 59, temperature 97.7 F (36.5 C), temperature source Rectal, resp. rate 18, height  (1.753 m), weight 78 kg (172 lb), SpO2 100 %.   1. General lying in bed in NAD,   2. Normal affect and insight, Not Suicidal or Homicidal, Awake Alert, Oriented X 3.  3. No F.N deficits, ALL C.Nerves Intact, Strength 5/5 all 4 extremities, Sensation intact all 4 extremities, Plantars down going.  4. Ears and Eyes appear Normal, Conjunctivae clear, PERRLA. Moist Oral Mucosa.  5. Supple Neck, No JVD, No cervical lymphadenopathy appriciated, No Carotid Bruits.  6. Symmetrical Chest wall movement, Good air movement bilaterally, CTAB.  7. RRR, No Gallops, Rubs or Murmurs, No Parasternal Heave.  8. Positive Bowel Sounds, Abdomen Soft, No tenderness, No organomegaly appriciated,No rebound -guarding or rigidity.  9.  No Cyanosis, Normal Skin Turgor, No Skin Rash or Bruise.  10. Good muscle tone,  joints appear normal , no effusions, Normal ROM.  11. No Palpable Lymph Nodes in Neck or Axillae     Data Review:    CBC  Recent Labs Lab 02/23/17 1920  WBC 16.7*  HGB 16.7  HCT 48.8  PLT 155  MCV 94.0  MCH 32.2  MCHC 34.2  RDW 14.2  LYMPHSABS 0.9  MONOABS 1.0  EOSABS 0.0  BASOSABS 0.0   ------------------------------------------------------------------------------------------------------------------  Chemistries   Recent Labs Lab 02/23/17 1920  NA 137  K 4.5  CL 101  CO2 20*  GLUCOSE 133*  BUN 43*  CREATININE 2.41*  CALCIUM 9.3  AST 517*  ALT 326*  ALKPHOS 276*  BILITOT 3.2*   ------------------------------------------------------------------------------------------------------------------ estimated creatinine clearance is 22 mL/min (A) (by C-G formula based on SCr of 2.41 mg/dL  (H)). ------------------------------------------------------------------------------------------------------------------ No results for input(s): TSH, T4TOTAL, T3FREE, THYROIDAB in the last 72 hours.  Invalid input(s): FREET3  Coagulation profile  Recent Labs Lab 02/23/17 1920  INR 4.19*   ------------------------------------------------------------------------------------------------------------------- No  results for input(s): DDIMER in the last 72 hours. -------------------------------------------------------------------------------------------------------------------  Cardiac Enzymes  Recent Labs Lab 02/23/17 1920  TROPONINI 0.49*   ------------------------------------------------------------------------------------------------------------------ No results found for: BNP   ---------------------------------------------------------------------------------------------------------------  Urinalysis    Component Value Date/Time   COLORURINE AMBER (A) 02/23/2017 1918   APPEARANCEUR CLOUDY (A) 02/23/2017 1918   LABSPEC 1.014 02/23/2017 1918   PHURINE 5.0 02/23/2017 1918   GLUCOSEU NEGATIVE 02/23/2017 1918   HGBUR LARGE (A) 02/23/2017 1918   BILIRUBINUR NEGATIVE 02/23/2017 1918   KETONESUR NEGATIVE 02/23/2017 1918   PROTEINUR 100 (A) 02/23/2017 1918   UROBILINOGEN 0.2 09/13/2012 1958   NITRITE NEGATIVE 02/23/2017 1918   LEUKOCYTESUR SMALL (A) 02/23/2017 1918    ----------------------------------------------------------------------------------------------------------------   Imaging Results:    Ct Abdomen Pelvis Wo Contrast  Result Date: 02/23/2017 CLINICAL DATA:  Fell in bath from with leg pain EXAM: CT ABDOMEN AND PELVIS WITHOUT CONTRAST TECHNIQUE: Multidetector CT imaging of the abdomen and pelvis was performed following the standard protocol without IV contrast. COMPARISON:  Radiographs 02/23/2017, CT 08/04/2014 FINDINGS: Lower chest: Mild subpleural fibrosis. No  acute consolidation or effusion. Cardiomegaly with partially visualized pacing leads. Coronary artery calcification. Dense mitral annular calcification. Hepatobiliary: Subcentimeter hypodensity within the anterior liver, not significantly changed. Interval increase in intra and extrahepatic biliary dilatation with extrahepatic bile duct measuring up to 16 mm. Multiple hyperdense foci a within the extrahepatic common bile duct, measuring up to 14 mm, consistent with stones. Pancreas: Unremarkable. No pancreatic ductal dilatation or surrounding inflammatory changes. Spleen: Normal in size without focal abnormality. Adrenals/Urinary Tract: Adrenal glands are within normal limits. Bilateral renal cysts. No hydronephrosis. Bladder unremarkable Stomach/Bowel: Stomach is nonenlarged. No dilated small bowel. No colon wall thickening. Moderate feces in the rectum. Vascular/Lymphatic: Extensive atherosclerotic calcification. Ectatic infrarenal abdominal aorta, measuring up to 2.6 cm. No significantly enlarged lymph nodes. Reproductive: Enlarged prostate gland with mass effect on the bladder Other: Negative for free air or free fluid. Musculoskeletal: No acute or suspicious bone lesion. Nonspecific sclerotic foci in the proximal right femur. Degenerative changes of the spine. Vertebral body heights appear maintained. IMPRESSION: 1. No CT evidence for acute intra-abdominal or pelvic abnormality. 2. Interval increase in intra and extrahepatic biliary dilatation status post cholecystectomy. Development of multiple hyperdense foci within the dilated common bile duct, consistent with stones/ choledocholithiasis. 3. Cardiomegaly.  Mild subpleural bibasilar fibrosis. 4. Ectatic infrarenal abdominal aorta up to 2.6 cm. Ectatic abdominal aorta at risk for aneurysm development. Recommend followup by ultrasound in 5 years. This recommendation follows ACR consensus guidelines: White Paper of the ACR Incidental Findings Committee II on  Vascular Findings. J Am Coll Radiol 2013; 10:789-794. 5. Enlarged prostate gland with mass effect on the bladder. Clinical correlation recommended. Electronically Signed   By: Jasmine Pang M.D.   On: 02/23/2017 22:19   Dg Chest 1 View  Result Date: 02/23/2017 CLINICAL DATA:  Fall EXAM: CHEST 1 VIEW COMPARISON:  09/13/2012 FINDINGS: Left-sided single lead pacing device as before. Cardiomegaly with central congestion. Coarse interstitial opacity likely due to chronic change. No consolidation or effusion. Aortic atherosclerosis. No pneumothorax. IMPRESSION: 1. Cardiomegaly 2. Coarse bilateral interstitial opacity consistent with chronic change. No acute infiltrate or edema. Electronically Signed   By: Jasmine Pang M.D.   On: 02/23/2017 20:49   Dg Tibia/fibula Right  Result Date: 02/23/2017 CLINICAL DATA:  Fall with leg pain EXAM: RIGHT TIBIA AND FIBULA - 2 VIEW COMPARISON:  None. FINDINGS: No fracture or malalignment. Vascular calcifications. Marked arthritis at the medial compartment.  Chondrocalcinosis. Patellofemoral degenerative changes. IMPRESSION: 1. No acute osseous abnormality 2. Chondrocalcinosis 3. Moderate severe arthritis of the knee Electronically Signed   By: Jasmine Pang M.D.   On: 02/23/2017 20:47   Ct Head Wo Contrast  Result Date: 02/23/2017 CLINICAL DATA:  Patient fell trying to get to the bathroom. Patient found on floor. RIGHT-sided weakness is chronic. EXAM: CT HEAD WITHOUT CONTRAST TECHNIQUE: Contiguous axial images were obtained from the base of the skull through the vertex without intravenous contrast. COMPARISON:  CT head 09/13/2012.  MR head 05/12/2009. FINDINGS: Brain: No evidence for acute infarction, hemorrhage, mass lesion, hydrocephalus, or extra-axial fluid. Generalized atrophy. Chronic microvascular ischemic change. Encephalomalacia of the LEFT frontal, temporal, and insular regions, also involving the basal ganglia, related to chronic infarctions, which were acute in  2011. Vascular: Calcification of the cavernous internal carotid arteries consistent with cerebrovascular atherosclerotic disease. No signs of intracranial large vessel occlusion. Skull: Normal. Negative for fracture or focal lesion. Sinuses/Orbits: No acute finding.  BILATERAL cataract extraction. Other: None. IMPRESSION: Atrophy and small vessel disease.  Old LEFT MCA territory infarct. No acute intracranial findings. Electronically Signed   By: Elsie Stain M.D.   On: 02/23/2017 20:51   Ct Hip Right Wo Contrast  Result Date: 02/23/2017 CLINICAL DATA:  Right hip pain radiating to the foot after fall at 2 a.m. this morning. EXAM: CT OF THE RIGHT HIP WITHOUT CONTRAST TECHNIQUE: Multidetector CT imaging of the right hip was performed according to the standard protocol. Multiplanar CT image reconstructions were also generated. COMPARISON:  None. FINDINGS: Bones/Joint/Cartilage Axial joint space narrowing of the right hip with spurring about the femoral head- neck junction. No joint effusion, fracture dislocation. Intact right superior and inferior pubic rami and pubic symphysis. Bone island noted of the intratrochanteric portion of the right femur. Ligaments Suboptimally assessed by CT. Muscles and Tendons No intramuscular hemorrhage or edema. Intramuscular lipoma of the right iliopsoas measuring 2.3 x 1.4 x 1.9 cm. Soft tissues Soft tissue edema along the lateral aspect of the right pelvis and hip. IMPRESSION: 1. Osteoarthritis of the right hip. 2. No acute fracture or malalignment. 3. Small 2.3 x 1.4 x 1.9 cm lipoma of the right iliopsoas. 4. Mild soft tissue contusion along the lateral aspect of the right hip and pelvis. Electronically Signed   By: Tollie Eth M.D.   On: 02/23/2017 22:09   Dg Femur Min 2 Views Right  Result Date: 02/23/2017 CLINICAL DATA:  Fall with leg pain EXAM: RIGHT FEMUR 2 VIEWS COMPARISON:  None. FINDINGS: Extensive vascular calcifications. Pubic symphysis and rami appear intact.  Questionable step-off deformity at the right femoral head neck junction. No dislocation. Mild to moderate arthritis at the right hip. IMPRESSION: 1. Questionable step-off deformity at the right femoral head neck junction, CT recommended for further evaluation. 2. Extensive vascular calcifications. Electronically Signed   By: Jasmine Pang M.D.   On: 02/23/2017 20:46      Assessment & Plan:    Principal Problem:   Choledocholithiasis Active Problems:   Atrial fibrillation (HCC)   ARF (acute renal failure) (HCC)   UTI (urinary tract infection)   Elevated troponin    Choledocholithiasis NPO Ns iv GI consulted by ED, appreciate input  Abnormal liver function Check acute hepatitis panel Check cmp in am  ARF Check urine sodium, urine creatinine, urine eosinophils Check renal ultrasound Hydrate with Ns Risk of hydration such as CHF explained to the family STOP Lisinopril  Rhabdomyolysis Check cpk in am  Troponin  elevation likely due to rhabdomyolysis Check trop in am  Pafib Cont  Carvedilol Hold Xarelto (INR 4.19)  Cardiomyopathy Cont lasix  Hypertension Cont lisinopril,   Hyperlipidemia Cont zetia  Seizure do Cont keppra   DVT Prophylaxis Heparin -  SCDs   AM Labs Ordered, also please review Full Orders  Family Communication: Admission, patients condition and plan of care including tests being ordered have been discussed with the patient and Starling Manns who indicate understanding and agree with the plan and Code Status.  Code Status DNR  Likely DC to home  Condition GUARDED    Consults called:  Gastroenterology by ED  Admission status: inpatient  Time spent in minutes : 45   Pearson Grippe M.D on 02/23/2017 at 11:28 PM  Between 7am to 7pm - Pager - [phone]  . After 7pm go to [email or web address] - password [facility]  [facility] - Office  [phone]

## 2017-02-23 NOTE — ED Notes (Signed)
Date and time results received: 02/23/17 2030 (use smartphrase ".now" to insert current time)  Test: troponin troponin Critical Value: 0.49  Name of Provider Notified: Dr Ranae Palms  Orders Received? Or Actions Taken?: Actions Taken: no orders received

## 2017-02-23 NOTE — ED Notes (Signed)
Pt was maximum assistance transferring him from wheelchair to bed. Pt c/o right leg pain from hip to foot. He states he fell in bathroom around 2am this morning.

## 2017-02-23 NOTE — ED Notes (Signed)
Date and time results received: 02/23/17 2040 (use smartphrase ".now" to insert current time)  Test: INR Critical Value: 4.19  Name of Provider Notified: Bronson Curb, RN  Orders Received? Or Actions Taken?: Actions Taken: no orders received

## 2017-02-23 NOTE — ED Provider Notes (Signed)
[facility] EMERGENCY DEPARTMENT Provider Note   CSN: [id number] Arrival date & time: 02/23/17  1104     History   Chief Complaint Chief Complaint  Patient presents with  . Fall    HPI Daniel Brown is a 83 y.o. male.  HPI Patient with residual right-sided weakness due to stroke 8 years ago presents after fall while at home. Patient lives alone and fell around 2 AM. States he was on the floor for roughly 7 hours. Family found patient and brought to the emergency department. He is complaining of right leg pain and is unable to bear weight. Per family patient has been having increasing right-sided weakness since time of his stroke. Recent fall several weeks ago. Past Medical History:  Diagnosis Date  . CAD (coronary artery disease)    by cath 1/11, medical management advised  . Cardiomyopathy, ischemic    EF 40% by echo 1/11  . Complete heart block (HCC)    s/p SJM PPM by [facility] 01/10/11  . COPD (chronic obstructive pulmonary disease) (HCC)   . CVD (cardiovascular disease)    s/p MCA stroke 1/11  . HTN (hypertension)   . Hyperlipidemia   . Permanent atrial fibrillation (HCC)   . Seizure disorder (HCC)   . Stroke [facility])     Patient Active Problem List   Diagnosis Date Noted  . Paroxysmal atrial fibrillation (HCC) 10/17/2015  . Therapeutic drug monitoring 10/17/2015  . Pacemaker-St.Jude 02/24/2012  . Complete heart block (HCC) 01/20/2011  . Chronic systolic dysfunction of left ventricle 09/23/2010  . Long term current use of anticoagulant 07/30/2010  . Coronary atherosclerosis 07/04/2009  . HYPERLIPIDEMIA 07/03/2009  . Essential hypertension 07/03/2009  . Atrial fibrillation (HCC) 07/03/2009  . CHF 07/03/2009  . COPD 07/03/2009  . SEIZURE DISORDER 07/03/2009    Past Surgical History:  Procedure Laterality Date  . PACEMAKER INSERTION  01/10/11   by Fawn Kirk for CHB       Home Medications    Prior to Admission medications   Medication Sig Start Date End Date  Taking? Authorizing Provider  carvedilol (COREG) 3.125 MG tablet Take 3.125 mg by mouth 2 (two) times daily.    Yes [provider]  Cholecalciferol (VITAMIN D) 2000 UNITS CAPS Take 2,000 Units by mouth daily.    Yes [provider]  ezetimibe (ZETIA) 10 MG tablet Take 10 mg by mouth daily.   Yes [provider]  furosemide (LASIX) 40 MG tablet Take 20 mg by mouth 3 (three) times a week. Monday, Wednesday and Friday   Yes [provider]  isosorbide mononitrate (IMDUR) 30 MG 24 hr tablet Take 30 mg by mouth daily.    Yes [provider]  levETIRAcetam (KEPPRA) 250 MG tablet Take 250 mg by mouth 2 (two) times daily.    Yes [provider]  lisinopril (PRINIVIL,ZESTRIL) 2.5 MG tablet Take 1 tablet (2.5 mg total) by mouth daily. 01/13/17  Yes Allred, Fayrene Fearing, MD  NITROSTAT 0.4 MG SL tablet Place 0.4 mg under the tongue as needed for chest pain.  01/17/11  Yes [provider]  XARELTO 20 MG TABS tablet TAKE 1 TABLET BY MOUTH  DAILY 08/25/16  Yes Allred, Fayrene Fearing, MD    Family History Family History  Problem Relation Age of Onset  . Family history unknown: Yes    Social History Social History  Substance Use Topics  . Smoking status: Former Games developer  . Smokeless tobacco: Never Used     Comment: quit 40  years ago  . Alcohol use No     Allergies   Patient has no known allergies.   Review of Systems Review of Systems  Constitutional: Negative for chills and fever.  HENT: Negative for facial swelling.   Eyes: Negative for visual disturbance.  Respiratory: Negative for cough and shortness of breath.   Cardiovascular: Negative for chest pain.  Gastrointestinal: Negative for abdominal pain, constipation, diarrhea, nausea and vomiting.  Genitourinary: Negative for dysuria, flank pain and frequency.  Musculoskeletal: Positive for joint swelling and myalgias. Negative for back pain, neck pain and neck stiffness.  Skin: Negative for rash and  wound.  Neurological: Positive for weakness. Negative for dizziness, syncope, light-headedness, numbness and headaches.  All other systems reviewed and are negative.    Physical Exam Updated Vital Signs BP 119/73   Pulse (!) 59   Temp 97.7 F (36.5 C) (Rectal)   Resp 18   Ht  (1.753 m)   Wt 78 kg (172 lb)   SpO2 100%   BMI 25.40 kg/m   Physical Exam  Constitutional: He is oriented to person, place, and time. He appears well-developed and well-nourished. No distress.  HENT:  Head: Normocephalic and atraumatic.  Mouth/Throat: Oropharynx is clear and moist. No oropharyngeal exudate.  Lips are blue.  Eyes: Pupils are equal, round, and reactive to light. EOM are normal.  Neck: Normal range of motion. Neck supple.  No posterior midline cervical tenderness to palpation.  Cardiovascular: Normal rate and regular rhythm.  Exam reveals no friction rub.   No murmur heard. Pulmonary/Chest: Effort normal and breath sounds normal. No respiratory distress. He has no wheezes. He has no rales. He exhibits no tenderness.  Abdominal: Soft. Bowel sounds are normal. There is no tenderness. There is no rebound and no guarding.  Musculoskeletal: Normal range of motion. He exhibits no edema or tenderness.  Pelvis is stable. Patient has limited range of motion of the right knee due to pain. He has some mild tenderness to palpation over the medial side of the proximal tibia. Distal extremities are cool to touch with delayed cap refill. No midline thoracic or lumbar tenderness.  Neurological: He is alert and oriented to person, place, and time.  Very hard of hearing. 5/5 grip strength bilaterally. Plantar flexion mildly decreased on the right compared to the left. Sensation is grossly intact.  Skin: Skin is warm and dry. Capillary refill takes more than 3 seconds. No rash noted. No erythema. There is pallor.  Pale distal extremities. Cool to touch.   Psychiatric: He has a normal mood and affect. His  behavior is normal.  Nursing note and vitals reviewed.    ED Treatments / Results  Labs (all labs ordered are listed, but only abnormal results are displayed) Labs Reviewed  CBC WITH DIFFERENTIAL/PLATELET - Abnormal; Notable for the following:       Result Value   WBC 16.7 (*)    Neutro Abs 14.8 (*)    All other components within normal limits  COMPREHENSIVE METABOLIC PANEL - Abnormal; Notable for the following:    CO2 20 (*)    Glucose, Bld 133 (*)    BUN 43 (*)    Creatinine, Ser 2.41 (*)    AST 517 (*)    ALT 326 (*)    Alkaline Phosphatase 276 (*)    Total Bilirubin 3.2 (*)    GFR calc non Af Amer 23 (*)    GFR calc Af Amer 26 (*)    Anion  gap 16 (*)    All other components within normal limits  TROPONIN I - Abnormal; Notable for the following:    Troponin I 0.49 (*)    All other components within normal limits  PROTIME-INR - Abnormal; Notable for the following:    Prothrombin Time 42.8 (*)    INR 4.19 (*)    All other components within normal limits  CK - Abnormal; Notable for the following:    Total CK 14,105 (*)    All other components within normal limits  APTT - Abnormal; Notable for the following:    aPTT 46 (*)    All other components within normal limits  URINALYSIS, ROUTINE W REFLEX MICROSCOPIC - Abnormal; Notable for the following:    Color, Urine AMBER (*)    APPearance CLOUDY (*)    Hgb urine dipstick LARGE (*)    Protein, ur 100 (*)    Leukocytes, UA SMALL (*)    Bacteria, UA FEW (*)    Squamous Epithelial / LPF 0-5 (*)    All other components within normal limits  LIPASE, BLOOD    EKG  EKG Interpretation None       Radiology Ct Abdomen Pelvis Wo Contrast  Result Date: 02/23/2017 CLINICAL DATA:  Fell in bath from with leg pain EXAM: CT ABDOMEN AND PELVIS WITHOUT CONTRAST TECHNIQUE: Multidetector CT imaging of the abdomen and pelvis was performed following the standard protocol without IV contrast. COMPARISON:  Radiographs 02/23/2017, CT  08/04/2014 FINDINGS: Lower chest: Mild subpleural fibrosis. No acute consolidation or effusion. Cardiomegaly with partially visualized pacing leads. Coronary artery calcification. Dense mitral annular calcification. Hepatobiliary: Subcentimeter hypodensity within the anterior liver, not significantly changed. Interval increase in intra and extrahepatic biliary dilatation with extrahepatic bile duct measuring up to 16 mm. Multiple hyperdense foci a within the extrahepatic common bile duct, measuring up to 14 mm, consistent with stones. Pancreas: Unremarkable. No pancreatic ductal dilatation or surrounding inflammatory changes. Spleen: Normal in size without focal abnormality. Adrenals/Urinary Tract: Adrenal glands are within normal limits. Bilateral renal cysts. No hydronephrosis. Bladder unremarkable Stomach/Bowel: Stomach is nonenlarged. No dilated small bowel. No colon wall thickening. Moderate feces in the rectum. Vascular/Lymphatic: Extensive atherosclerotic calcification. Ectatic infrarenal abdominal aorta, measuring up to 2.6 cm. No significantly enlarged lymph nodes. Reproductive: Enlarged prostate gland with mass effect on the bladder Other: Negative for free air or free fluid. Musculoskeletal: No acute or suspicious bone lesion. Nonspecific sclerotic foci in the proximal right femur. Degenerative changes of the spine. Vertebral body heights appear maintained. IMPRESSION: 1. No CT evidence for acute intra-abdominal or pelvic abnormality. 2. Interval increase in intra and extrahepatic biliary dilatation status post cholecystectomy. Development of multiple hyperdense foci within the dilated common bile duct, consistent with stones/ choledocholithiasis. 3. Cardiomegaly.  Mild subpleural bibasilar fibrosis. 4. Ectatic infrarenal abdominal aorta up to 2.6 cm. Ectatic abdominal aorta at risk for aneurysm development. Recommend followup by ultrasound in 5 years. This recommendation follows ACR consensus guidelines:  White Paper of the ACR Incidental Findings Committee II on Vascular Findings. J Am Coll Radiol 2013; 10:789-794. 5. Enlarged prostate gland with mass effect on the bladder. Clinical correlation recommended. Electronically Signed   By: Jasmine Pang M.D.   On: 02/23/2017 22:19   Dg Chest 1 View  Result Date: 02/23/2017 CLINICAL DATA:  Fall EXAM: CHEST 1 VIEW COMPARISON:  09/13/2012 FINDINGS: Left-sided single lead pacing device as before. Cardiomegaly with central congestion. Coarse interstitial opacity likely due to chronic change. No consolidation or effusion.  Aortic atherosclerosis. No pneumothorax. IMPRESSION: 1. Cardiomegaly 2. Coarse bilateral interstitial opacity consistent with chronic change. No acute infiltrate or edema. Electronically Signed   By: Jasmine Pang M.D.   On: 02/23/2017 20:49   Dg Tibia/fibula Right  Result Date: 02/23/2017 CLINICAL DATA:  Fall with leg pain EXAM: RIGHT TIBIA AND FIBULA - 2 VIEW COMPARISON:  None. FINDINGS: No fracture or malalignment. Vascular calcifications. Marked arthritis at the medial compartment. Chondrocalcinosis. Patellofemoral degenerative changes. IMPRESSION: 1. No acute osseous abnormality 2. Chondrocalcinosis 3. Moderate severe arthritis of the knee Electronically Signed   By: Jasmine Pang M.D.   On: 02/23/2017 20:47   Ct Head Wo Contrast  Result Date: 02/23/2017 CLINICAL DATA:  Patient fell trying to get to the bathroom. Patient found on floor. RIGHT-sided weakness is chronic. EXAM: CT HEAD WITHOUT CONTRAST TECHNIQUE: Contiguous axial images were obtained from the base of the skull through the vertex without intravenous contrast. COMPARISON:  CT head 09/13/2012.  MR head 05/12/2009. FINDINGS: Brain: No evidence for acute infarction, hemorrhage, mass lesion, hydrocephalus, or extra-axial fluid. Generalized atrophy. Chronic microvascular ischemic change. Encephalomalacia of the LEFT frontal, temporal, and insular regions, also involving the basal  ganglia, related to chronic infarctions, which were acute in 2011. Vascular: Calcification of the cavernous internal carotid arteries consistent with cerebrovascular atherosclerotic disease. No signs of intracranial large vessel occlusion. Skull: Normal. Negative for fracture or focal lesion. Sinuses/Orbits: No acute finding.  BILATERAL cataract extraction. Other: None. IMPRESSION: Atrophy and small vessel disease.  Old LEFT MCA territory infarct. No acute intracranial findings. Electronically Signed   By: Elsie Stain M.D.   On: 02/23/2017 20:51   Ct Hip Right Wo Contrast  Result Date: 02/23/2017 CLINICAL DATA:  Right hip pain radiating to the foot after fall at 2 a.m. this morning. EXAM: CT OF THE RIGHT HIP WITHOUT CONTRAST TECHNIQUE: Multidetector CT imaging of the right hip was performed according to the standard protocol. Multiplanar CT image reconstructions were also generated. COMPARISON:  None. FINDINGS: Bones/Joint/Cartilage Axial joint space narrowing of the right hip with spurring about the femoral head- neck junction. No joint effusion, fracture dislocation. Intact right superior and inferior pubic rami and pubic symphysis. Bone island noted of the intratrochanteric portion of the right femur. Ligaments Suboptimally assessed by CT. Muscles and Tendons No intramuscular hemorrhage or edema. Intramuscular lipoma of the right iliopsoas measuring 2.3 x 1.4 x 1.9 cm. Soft tissues Soft tissue edema along the lateral aspect of the right pelvis and hip. IMPRESSION: 1. Osteoarthritis of the right hip. 2. No acute fracture or malalignment. 3. Small 2.3 x 1.4 x 1.9 cm lipoma of the right iliopsoas. 4. Mild soft tissue contusion along the lateral aspect of the right hip and pelvis. Electronically Signed   By: Tollie Eth M.D.   On: 02/23/2017 22:09   Dg Femur Min 2 Views Right  Result Date: 02/23/2017 CLINICAL DATA:  Fall with leg pain EXAM: RIGHT FEMUR 2 VIEWS COMPARISON:  None. FINDINGS: Extensive  vascular calcifications. Pubic symphysis and rami appear intact. Questionable step-off deformity at the right femoral head neck junction. No dislocation. Mild to moderate arthritis at the right hip. IMPRESSION: 1. Questionable step-off deformity at the right femoral head neck junction, CT recommended for further evaluation. 2. Extensive vascular calcifications. Electronically Signed   By: Jasmine Pang M.D.   On: 02/23/2017 20:46    Procedures Procedures (including critical care time)  Medications Ordered in ED Medications  sodium chloride 0.9 % bolus 500 mL (0 mLs  Intravenous Stopped 02/23/17 2232)     Initial Impression / Assessment and Plan / ED Course  I have reviewed the triage vital signs and the nursing notes.  Pertinent labs & imaging results that were available during my care of the patient were reviewed by me and considered in my medical decision making (see chart for details).     Patient is resting comfortably. Vital signs have been stable. No evidence of choledocholithiasis on CT abdomen. New-onset renal failure in the setting of rhabdomyolysis. Will start IV fluids. Patient also has elevation in troponin which may be related to the rhabdomyolysis. Discussed with Dr. Selena Batten. Will see patient in the emergency department and admit. Also consulted Dr. Jena Gauss Recommends keeping the patient NPO after midnight and will see in the morning.  Final Clinical Impressions(s) / ED Diagnoses   Final diagnoses:  Choledocholithiasis  Non-traumatic rhabdomyolysis  AKI (acute kidney injury) (HCC)    New Prescriptions New Prescriptions   No medications on file     Loren Racer, MD 02/23/17 2255

## 2017-02-24 ENCOUNTER — Encounter (HOSPITAL_COMMUNITY): Payer: Self-pay | Admitting: Gastroenterology

## 2017-02-24 ENCOUNTER — Inpatient Hospital Stay (HOSPITAL_COMMUNITY): Payer: Medicare Other

## 2017-02-24 DIAGNOSIS — N183 Chronic kidney disease, stage 3 unspecified: Secondary | ICD-10-CM | POA: Insufficient documentation

## 2017-02-24 DIAGNOSIS — M6282 Rhabdomyolysis: Secondary | ICD-10-CM | POA: Insufficient documentation

## 2017-02-24 DIAGNOSIS — K8050 Calculus of bile duct without cholangitis or cholecystitis without obstruction: Secondary | ICD-10-CM

## 2017-02-24 DIAGNOSIS — L8990 Pressure ulcer of unspecified site, unspecified stage: Secondary | ICD-10-CM | POA: Insufficient documentation

## 2017-02-24 LAB — CBC
HEMATOCRIT: 46.8 % (ref 39.0–52.0)
Hemoglobin: 16.2 g/dL (ref 13.0–17.0)
MCH: 31.8 pg (ref 26.0–34.0)
MCHC: 34.6 g/dL (ref 30.0–36.0)
MCV: 91.9 fL (ref 78.0–100.0)
PLATELETS: 136 10*3/uL — AB (ref 150–400)
RBC: 5.09 MIL/uL (ref 4.22–5.81)
RDW: 14.2 % (ref 11.5–15.5)
WBC: 15.3 10*3/uL — AB (ref 4.0–10.5)

## 2017-02-24 LAB — COMPREHENSIVE METABOLIC PANEL
ALBUMIN: 3.1 g/dL — AB (ref 3.5–5.0)
ALT: 268 U/L — AB (ref 17–63)
AST: 361 U/L — AB (ref 15–41)
Alkaline Phosphatase: 203 U/L — ABNORMAL HIGH (ref 38–126)
Anion gap: 14 (ref 5–15)
BUN: 44 mg/dL — AB (ref 6–20)
CHLORIDE: 105 mmol/L (ref 101–111)
CO2: 19 mmol/L — AB (ref 22–32)
CREATININE: 2.03 mg/dL — AB (ref 0.61–1.24)
Calcium: 8.6 mg/dL — ABNORMAL LOW (ref 8.9–10.3)
GFR calc Af Amer: 32 mL/min — ABNORMAL LOW (ref >60)
GFR, EST NON AFRICAN AMERICAN: 28 mL/min — AB (ref >60)
Glucose, Bld: 100 mg/dL — ABNORMAL HIGH (ref 65–99)
POTASSIUM: 4 mmol/L (ref 3.5–5.1)
SODIUM: 138 mmol/L (ref 135–145)
Total Bilirubin: 2.6 mg/dL — ABNORMAL HIGH (ref 0.3–1.2)
Total Protein: 6.4 g/dL — ABNORMAL LOW (ref 6.5–8.1)

## 2017-02-24 LAB — TROPONIN I: Troponin I: 0.28 ng/mL (ref <0.03)

## 2017-02-24 LAB — SODIUM, URINE, RANDOM: SODIUM UR: 17 mmol/L

## 2017-02-24 LAB — PROTIME-INR
INR: 3.4
PROTHROMBIN TIME: 34.1 s — AB (ref 11.4–15.2)

## 2017-02-24 LAB — CK TOTAL AND CKMB (NOT AT ARMC)
CK TOTAL: 7397 U/L — AB (ref 49–397)
CK, MB: 133.5 ng/mL — AB (ref 0.5–5.0)
RELATIVE INDEX: 1.8 (ref 0.0–2.5)

## 2017-02-24 LAB — CK: Total CK: 7397 U/L — ABNORMAL HIGH (ref 49–397)

## 2017-02-24 MED ORDER — PIPERACILLIN-TAZOBACTAM 3.375 G IVPB
3.3750 g | Freq: Three times a day (TID) | INTRAVENOUS | Status: DC
  Administered 2017-02-24: 3.375 g via INTRAVENOUS
  Filled 2017-02-24: qty 50

## 2017-02-24 MED ORDER — SODIUM CHLORIDE 0.9 % IV SOLN
INTRAVENOUS | Status: AC
  Administered 2017-02-24: 02:00:00 via INTRAVENOUS

## 2017-02-24 MED ORDER — ACETAMINOPHEN 650 MG RE SUPP: 650.0000 mg | Freq: Four times a day (QID) | RECTAL | Status: DC | PRN

## 2017-02-24 MED ORDER — LEVETIRACETAM 250 MG PO TABS
250.0000 mg | ORAL_TABLET | Freq: Two times a day (BID) | ORAL | Status: DC
  Administered 2017-02-24 – 2017-03-03 (×16): 250 mg via ORAL
  Filled 2017-02-24 (×17): qty 1

## 2017-02-24 MED ORDER — PIPERACILLIN-TAZOBACTAM 3.375 G IVPB
3.3750 g | Freq: Three times a day (TID) | INTRAVENOUS | Status: DC
  Administered 2017-02-24 – 2017-02-27 (×10): 3.375 g via INTRAVENOUS
  Filled 2017-02-24 (×11): qty 50

## 2017-02-24 MED ORDER — ISOSORBIDE MONONITRATE ER 30 MG PO TB24
30.0000 mg | ORAL_TABLET | Freq: Every day | ORAL | Status: DC
  Administered 2017-02-24 – 2017-03-03 (×8): 30 mg via ORAL
  Filled 2017-02-24 (×9): qty 1

## 2017-02-24 MED ORDER — PIPERACILLIN-TAZOBACTAM 3.375 G IVPB
3.3750 g | Freq: Once | INTRAVENOUS | Status: AC
  Administered 2017-02-24: 3.375 g via INTRAVENOUS
  Filled 2017-02-24: qty 50

## 2017-02-24 MED ORDER — EZETIMIBE 10 MG PO TABS
10.0000 mg | ORAL_TABLET | Freq: Every day | ORAL | Status: DC
Start: 2017-02-24 — End: 2017-03-03
  Administered 2017-02-24 – 2017-03-03 (×8): 10 mg via ORAL
  Filled 2017-02-24 (×9): qty 1

## 2017-02-24 MED ORDER — CARVEDILOL 3.125 MG PO TABS
3.1250 mg | ORAL_TABLET | Freq: Two times a day (BID) | ORAL | Status: DC
  Administered 2017-02-24 – 2017-03-03 (×15): 3.125 mg via ORAL
  Filled 2017-02-24 (×16): qty 1

## 2017-02-24 MED ORDER — ACETAMINOPHEN 325 MG PO TABS
650.0000 mg | ORAL_TABLET | Freq: Four times a day (QID) | ORAL | Status: DC | PRN
  Administered 2017-02-25 – 2017-03-02 (×3): 650 mg via ORAL
  Filled 2017-02-24 (×3): qty 2

## 2017-02-24 NOTE — ED Notes (Signed)
Call when pt is transferred. \ [name] McClue [phone]  Hardin Negus [phone]

## 2017-02-24 NOTE — ED Notes (Signed)
Called [facility] for transport to [facility]. 

## 2017-02-24 NOTE — ED Notes (Signed)
Pt resting with eyes closed, appears to be in no distress. Respirations are even and unlabored. Updated about wait for bed at [facility].

## 2017-02-24 NOTE — Progress Notes (Signed)
Pharmacy Antibiotic Note  Daniel Brown is a 83 y.o. male admitted on 02/23/2017 with intr-abdominal infection.Marland Kitchen  Pharmacy has been consulted for Zosyn dosing.  Plan: Zosyn 3.375g IV q8h (4 hour infusion).  F/u cxs and clinical progress Monitor V/S, labs  Height:  (175.3 cm) Weight: 172 lb (78 kg) IBW/kg (Calculated) : 70.7  Temp (24hrs), Avg:97.7 F (36.5 C), Min:97.7 F (36.5 C), Max:97.7 F (36.5 C)   Recent Labs Lab 02/23/17 1920  WBC 16.7*  CREATININE 2.41*    Estimated Creatinine Clearance: 22 mL/min (A) (by C-G formula based on SCr of 2.41 mg/dL (H)).    No Known Allergies  Antimicrobials this admission: Zosyn 10/16 >>   Dose adjustments this admission: N/A  Microbiology results: None available  Thank you for allowing pharmacy to be a part of this patient's care.  Elder Cyphers, BS Pharm D, [place] Clinical Pharmacist Pager [phone] 02/24/2017 8:04 AM

## 2017-02-24 NOTE — ED Notes (Signed)
Verified with MD Laural Benes pt is to be transferred to [facility].

## 2017-02-24 NOTE — ED Notes (Signed)
Report given to [facility] at this time. MD Laural Benes called to complete EMTALA.

## 2017-02-24 NOTE — Progress Notes (Signed)
ANTIBIOTIC CONSULT NOTE-Preliminary  Pharmacy Consult for Zosyn Indication: Intra abdominal infection  No Known Allergies  Patient Measurements: Height:  (175.3 cm) Weight: 172 lb (78 kg) IBW/kg (Calculated) : 70.7  Vital Signs: Temp: 97.7 F (36.5 C) (10/15 2229) Temp Source: Rectal (10/15 2229) BP: 119/73 (10/15 2230) Pulse Rate: 59 (10/15 2230)  Labs:  Recent Labs  02/23/17 1920  WBC 16.7*  HGB 16.7  PLT 155  CREATININE 2.41*    Estimated Creatinine Clearance: 22 mL/min (A) (by C-G formula based on SCr of 2.41 mg/dL (H)).  No results for input(s): VANCOTROUGH, VANCOPEAK, VANCORANDOM, GENTTROUGH, GENTPEAK, GENTRANDOM, TOBRATROUGH, TOBRAPEAK, TOBRARND, AMIKACINPEAK, AMIKACINTROU, AMIKACIN in the last 72 hours.   Microbiology: No results found for this or any previous visit (from the past 720 hour(s)).  Medical History: Past Medical History:  Diagnosis Date  . CAD (coronary artery disease)    by cath 1/11, medical management advised  . Cardiomyopathy, ischemic    EF 40% by echo 1/11  . Complete heart block (HCC)    s/p SJM PPM by [facility] 01/10/11  . COPD (chronic obstructive pulmonary disease) (HCC)   . CVD (cardiovascular disease)    s/p MCA stroke 1/11  . HTN (hypertension)   . Hyperlipidemia   . Permanent atrial fibrillation (HCC)   . Seizure disorder (HCC)   . Stroke [facility])     Medications:   Assessment: 83 yo male seen in the ED for evaluation of a fall at home. Pt is in ARF secondary to rhabdomyolysis with UTI and abnormal LFTs. Pharmacy has been asked t dose Zosyn for intra abdominal infection.  Goal of Therapy:  Eradicated infection  Plan:  Preliminary review of pertinent patient information completed.  Protocol will be initiated with dose of Zosyn 3.375 Gm IV.  Jeani Hawking clinical pharmacist will complete review during morning rounds to assess patient and finalize treatment regimen if needed.  Arelia Sneddon, [facility] 02/24/2017,12:31 AM

## 2017-02-24 NOTE — ED Notes (Signed)
Pt resting with eyes closed, appears to be in no distress. Respirations are even and unlabored.  

## 2017-02-24 NOTE — Progress Notes (Signed)
PROGRESS NOTE   COREN SAGAN  ZOX:[id number]  DOB: 09/26/1927  DOA: 02/23/2017 PCP: [name], MD (Inactive)   Brief Admission Hx: Pt admitted for fall (down 7 hours) and acute renal failure secondary to rhabdomyolysis and dehydration, and UTI and abnormal liver function and choledocholithiasis for which the ED consulted GI (Dr. Kendell Bane).  His CK>14000, he was started on IVFs.  He will need ERCP when medically stabilized.    MDM/Assessment & Plan:   1. ARF - Prerenal from dehydration.  Pt is receiving IVF hydration with good results.  Creatinine coming down.  Follow BMP.  Continue hydration.  Lisinopril on hold. 2. Rhabdomyolysis - secondary to fall at home and was down for some time.  CK>14000, continue IVF hydration.  Follow CK levels.   3. Choledocholithiasis - Appreciate GI assistance.  I spoke with Dr. Jonette Eva with GI. Pt needs transfer to [facility] for ERCP.  She is calling GI [facility] for sign out.  Pt will remain on hospitalist service at [facility] with GI consulting.  LFTs trending down. Continue Zosyn IV.  4. Elevated troponin - in the setting of rhabdomyolysis and AKI the troponin is trending down, he is not having any signs or symptoms of ACS, monitor on telemetry.  5. UTI - treating with Zosyn IV. Follow urine culture.   6. Elevated INR - Pt has been taking Xarelto which is on hold.   7. Cardiomyopathy - monitor fluid balance closely, currently compensated, fluids given gently.  8. Hypertension - continue imdur and coreg, holding lisinopril.  9. Epilepsy - continue keppra.  10. PAfib - continue carvedilol, holding Xarelto for now.  11. Hyperlipidemia - continue zetia.   DVT prophylaxis: SCDs Code Status: DNR Family Communication: not present Disposition Plan: TBD   Consultants:  GI  Subjective: Pt without complaints, he is very jolly and conversational  Objective: Vitals:   02/24/17 0600 02/24/17 0800 02/24/17 0841 02/24/17 0844  BP: 119/72 113/72  115/72   Pulse: 60 62 (!) 55 60  Resp:   16   Temp:      TempSrc:      SpO2: 94% 94% 96%   Weight:      Height:        Intake/Output Summary (Last 24 hours) at 02/24/17 8119 Last data filed at 02/24/17 1478  Gross per 24 hour  Intake              550 ml  Output                0 ml  Net              550 ml   Filed Weights   02/23/17 1115  Weight: 78 kg (172 lb)     REVIEW OF SYSTEMS  As per history otherwise all reviewed and reported negative  Exam:  General exam: awake, alert, NAD. Cooperative.  Respiratory system: Clear. No increased work of breathing. Cardiovascular system: S1 & S2 heard.  Gastrointestinal system: Abdomen is nondistended, soft. Normal bowel sounds heard. Central nervous system: Alert and oriented. No focal neurological deficits. Extremities: no CCE.  Data Reviewed: Basic Metabolic Panel:  Recent Labs Lab 02/23/17 1920 02/24/17 0747  NA 137 138  K 4.5 4.0  CL 101 105  CO2 20* 19*  GLUCOSE 133* 100*  BUN 43* 44*  CREATININE 2.41* 2.03*  CALCIUM 9.3 8.6*   Liver Function Tests:  Recent Labs Lab 02/23/17 1920 02/24/17 0747  AST 517*  361*  ALT 326* 268*  ALKPHOS 276* 203*  BILITOT 3.2* 2.6*  PROT 8.1 6.4*  ALBUMIN 3.9 3.1*    Recent Labs Lab 02/23/17 1920  LIPASE 25   No results for input(s): AMMONIA in the last 168 hours. CBC:  Recent Labs Lab 02/23/17 1920 02/24/17 0747  WBC 16.7* 15.3*  NEUTROABS 14.8*  --   HGB 16.7 16.2  HCT 48.8 46.8  MCV 94.0 91.9  PLT 155 136*   Cardiac Enzymes:  Recent Labs Lab 02/23/17 1920 02/24/17 0747  CKTOTAL 14,105*  --   TROPONINI 0.49* 0.28*   CBG (last 3)  No results for input(s): GLUCAP in the last 72 hours. No results found for this or any previous visit (from the past 240 hour(s)).   Studies: Ct Abdomen Pelvis Wo Contrast  Result Date: 02/23/2017 CLINICAL DATA:  Fell in bath from with leg pain EXAM: CT ABDOMEN AND PELVIS WITHOUT CONTRAST TECHNIQUE: Multidetector CT  imaging of the abdomen and pelvis was performed following the standard protocol without IV contrast. COMPARISON:  Radiographs 02/23/2017, CT 08/04/2014 FINDINGS: Lower chest: Mild subpleural fibrosis. No acute consolidation or effusion. Cardiomegaly with partially visualized pacing leads. Coronary artery calcification. Dense mitral annular calcification. Hepatobiliary: Subcentimeter hypodensity within the anterior liver, not significantly changed. Interval increase in intra and extrahepatic biliary dilatation with extrahepatic bile duct measuring up to 16 mm. Multiple hyperdense foci a within the extrahepatic common bile duct, measuring up to 14 mm, consistent with stones. Pancreas: Unremarkable. No pancreatic ductal dilatation or surrounding inflammatory changes. Spleen: Normal in size without focal abnormality. Adrenals/Urinary Tract: Adrenal glands are within normal limits. Bilateral renal cysts. No hydronephrosis. Bladder unremarkable Stomach/Bowel: Stomach is nonenlarged. No dilated small bowel. No colon wall thickening. Moderate feces in the rectum. Vascular/Lymphatic: Extensive atherosclerotic calcification. Ectatic infrarenal abdominal aorta, measuring up to 2.6 cm. No significantly enlarged lymph nodes. Reproductive: Enlarged prostate gland with mass effect on the bladder Other: Negative for free air or free fluid. Musculoskeletal: No acute or suspicious bone lesion. Nonspecific sclerotic foci in the proximal right femur. Degenerative changes of the spine. Vertebral body heights appear maintained. IMPRESSION: 1. No CT evidence for acute intra-abdominal or pelvic abnormality. 2. Interval increase in intra and extrahepatic biliary dilatation status post cholecystectomy. Development of multiple hyperdense foci within the dilated common bile duct, consistent with stones/ choledocholithiasis. 3. Cardiomegaly.  Mild subpleural bibasilar fibrosis. 4. Ectatic infrarenal abdominal aorta up to 2.6 cm. Ectatic  abdominal aorta at risk for aneurysm development. Recommend followup by ultrasound in 5 years. This recommendation follows ACR consensus guidelines: White Paper of the ACR Incidental Findings Committee II on Vascular Findings. J Am Coll Radiol 2013; 10:789-794. 5. Enlarged prostate gland with mass effect on the bladder. Clinical correlation recommended. Electronically Signed   By: Jasmine Pang M.D.   On: 02/23/2017 22:19   Dg Chest 1 View  Result Date: 02/23/2017 CLINICAL DATA:  Fall EXAM: CHEST 1 VIEW COMPARISON:  09/13/2012 FINDINGS: Left-sided single lead pacing device as before. Cardiomegaly with central congestion. Coarse interstitial opacity likely due to chronic change. No consolidation or effusion. Aortic atherosclerosis. No pneumothorax. IMPRESSION: 1. Cardiomegaly 2. Coarse bilateral interstitial opacity consistent with chronic change. No acute infiltrate or edema. Electronically Signed   By: Jasmine Pang M.D.   On: 02/23/2017 20:49   Dg Tibia/fibula Right  Result Date: 02/23/2017 CLINICAL DATA:  Fall with leg pain EXAM: RIGHT TIBIA AND FIBULA - 2 VIEW COMPARISON:  None. FINDINGS: No fracture or malalignment. Vascular calcifications. Marked  arthritis at the medial compartment. Chondrocalcinosis. Patellofemoral degenerative changes. IMPRESSION: 1. No acute osseous abnormality 2. Chondrocalcinosis 3. Moderate severe arthritis of the knee Electronically Signed   By: Jasmine Pang M.D.   On: 02/23/2017 20:47   Ct Head Wo Contrast  Result Date: 02/23/2017 CLINICAL DATA:  Patient fell trying to get to the bathroom. Patient found on floor. RIGHT-sided weakness is chronic. EXAM: CT HEAD WITHOUT CONTRAST TECHNIQUE: Contiguous axial images were obtained from the base of the skull through the vertex without intravenous contrast. COMPARISON:  CT head 09/13/2012.  MR head 05/12/2009. FINDINGS: Brain: No evidence for acute infarction, hemorrhage, mass lesion, hydrocephalus, or extra-axial fluid.  Generalized atrophy. Chronic microvascular ischemic change. Encephalomalacia of the LEFT frontal, temporal, and insular regions, also involving the basal ganglia, related to chronic infarctions, which were acute in 2011. Vascular: Calcification of the cavernous internal carotid arteries consistent with cerebrovascular atherosclerotic disease. No signs of intracranial large vessel occlusion. Skull: Normal. Negative for fracture or focal lesion. Sinuses/Orbits: No acute finding.  BILATERAL cataract extraction. Other: None. IMPRESSION: Atrophy and small vessel disease.  Old LEFT MCA territory infarct. No acute intracranial findings. Electronically Signed   By: Elsie Stain M.D.   On: 02/23/2017 20:51   Ct Hip Right Wo Contrast  Result Date: 02/23/2017 CLINICAL DATA:  Right hip pain radiating to the foot after fall at 2 a.m. this morning. EXAM: CT OF THE RIGHT HIP WITHOUT CONTRAST TECHNIQUE: Multidetector CT imaging of the right hip was performed according to the standard protocol. Multiplanar CT image reconstructions were also generated. COMPARISON:  None. FINDINGS: Bones/Joint/Cartilage Axial joint space narrowing of the right hip with spurring about the femoral head- neck junction. No joint effusion, fracture dislocation. Intact right superior and inferior pubic rami and pubic symphysis. Bone island noted of the intratrochanteric portion of the right femur. Ligaments Suboptimally assessed by CT. Muscles and Tendons No intramuscular hemorrhage or edema. Intramuscular lipoma of the right iliopsoas measuring 2.3 x 1.4 x 1.9 cm. Soft tissues Soft tissue edema along the lateral aspect of the right pelvis and hip. IMPRESSION: 1. Osteoarthritis of the right hip. 2. No acute fracture or malalignment. 3. Small 2.3 x 1.4 x 1.9 cm lipoma of the right iliopsoas. 4. Mild soft tissue contusion along the lateral aspect of the right hip and pelvis. Electronically Signed   By: Tollie Eth M.D.   On: 02/23/2017 22:09   Dg  Femur Min 2 Views Right  Result Date: 02/23/2017 CLINICAL DATA:  Fall with leg pain EXAM: RIGHT FEMUR 2 VIEWS COMPARISON:  None. FINDINGS: Extensive vascular calcifications. Pubic symphysis and rami appear intact. Questionable step-off deformity at the right femoral head neck junction. No dislocation. Mild to moderate arthritis at the right hip. IMPRESSION: 1. Questionable step-off deformity at the right femoral head neck junction, CT recommended for further evaluation. 2. Extensive vascular calcifications. Electronically Signed   By: Jasmine Pang M.D.   On: 02/23/2017 20:46    Scheduled Meds: . carvedilol  3.125 mg Oral BID WC  . ezetimibe  10 mg Oral Daily  . isosorbide mononitrate  30 mg Oral Daily  . levETIRAcetam  250 mg Oral BID   Continuous Infusions: . sodium chloride 125 mL/hr at 02/24/17 0132  . piperacillin-tazobactam (ZOSYN)  IV      Principal Problem:   Choledocholithiasis Active Problems:   Atrial fibrillation (HCC)   ARF (acute renal failure) (HCC)   UTI (urinary tract infection)   Elevated troponin   Pressure injury of  skin  Time spent:   Standley Dakins, MD, FAAFP [facility] Pager [phone]  If 7PM-7AM, please contact night-coverage [email or web address] Password [email or web address] 02/24/2017, 9:27 AM    LOS: 1 day

## 2017-02-24 NOTE — Consult Note (Signed)
Referring Provider: Dr. Selena Batten  Primary Care Physician:  [name], MD (Inactive) Primary Gastroenterologist:  Dr. Darrick Penna, also appears Dr. Dulce Sellar possibly in 2016 (US abdomen on file) Cardiologist: Dr. Hillis Range   Date of Admission: 02/23/17 Date of Consultation: 02/24/17  Reason for Consultation:  Choledocholithiasis   HPI:  Daniel Brown is a delightful  83 y.o. year old male who was found down at home by a family friend for 7 hours and was brought to the ED. He was found to have acute renal failure, UTI, elevated LFTs, and evidence for multiple stones within a dilated CBD measuring up to 16 mm. Leukocytosis with admitting WBC count 16.7. Afebrile on admission and denies any prior fever/chills. History complicated by chronic anticoagulation on Xarelto, with supratherapeutic INR at 4.19 on admission. He is followed by Dr. Johney Brown (cardiologist) due to history of complete heart block s/p pacemaker placed in 2012. Echo in 2012 with EF 45-50%.   States he fell in his bathtub around 2am on Monday morning. States he believes he was down about 5 hours. Reports his son calls him every day around 7:45 to 8 am and was not able to reach him, so he sent someone to check on him. Denies abdominal pain. No N/V. Fair appetite. Stays stable around 172 lbs. Denies any confusion or mental status changes. No shortness of breath, chest pain. Reports right sided weakness in leg lately.    On Xarelto but he is unsure when he last took this. Daniel Brown helps with his medications, lives with his son.   Upon a brief review of epic, US abdomen was completed in Aug 2016 and ordered by Dr. Dulce Sellar. CBD was 10 mm at that time.   Past Medical History:  Diagnosis Date  . CAD (coronary artery disease)    by cath 1/11, medical management advised  . Cardiomyopathy, ischemic    EF 40% by echo 1/11  . Complete heart block (HCC)    s/p SJM PPM by [facility] 01/10/11  . COPD (chronic obstructive pulmonary disease) (HCC)    . CVD (cardiovascular disease)    s/p MCA stroke 1/11  . HTN (hypertension)   . Hyperlipidemia   . Permanent atrial fibrillation (HCC)   . Seizure disorder (HCC)   . Stroke [facility])     Past Surgical History:  Procedure Laterality Date  . CHOLECYSTECTOMY    . PACEMAKER INSERTION  01/10/11   by Fawn Kirk for CHB    Prior to Admission medications   Medication Sig Start Date End Date Taking? Authorizing Provider  carvedilol (COREG) 3.125 MG tablet Take 3.125 mg by mouth 2 (two) times daily.    Yes [provider]  Cholecalciferol (VITAMIN D) 2000 UNITS CAPS Take 2,000 Units by mouth daily.    Yes [provider]  ezetimibe (ZETIA) 10 MG tablet Take 10 mg by mouth daily.   Yes [provider]  furosemide (LASIX) 40 MG tablet Take 20 mg by mouth 3 (three) times a week. Monday, Wednesday and Friday   Yes [provider]  isosorbide mononitrate (IMDUR) 30 MG 24 hr tablet Take 30 mg by mouth daily.    Yes [provider]  levETIRAcetam (KEPPRA) 250 MG tablet Take 250 mg by mouth 2 (two) times daily.    Yes [provider]  lisinopril (PRINIVIL,ZESTRIL) 2.5 MG tablet Take 1 tablet (2.5 mg total) by mouth daily. 01/13/17  Yes Allred, Fayrene Fearing, MD  NITROSTAT 0.4 MG SL tablet Place 0.4 mg  under the tongue as needed for chest pain.  01/17/11  Yes [provider]  XARELTO 20 MG TABS tablet TAKE 1 TABLET BY MOUTH  DAILY 08/25/16  Yes Allred, Fayrene Fearing, MD    Current Facility-Administered Medications  Medication Dose Route Frequency Provider Last Rate Last Dose  . 0.9 %  sodium chloride infusion   Intravenous Continuous Pearson Grippe, MD 125 mL/hr at 02/24/17 0132    . acetaminophen (TYLENOL) tablet 650 mg  650 mg Oral Q6H PRN Pearson Grippe, MD       Or  . acetaminophen (TYLENOL) suppository 650 mg  650 mg Rectal Q6H PRN Pearson Grippe, MD      . carvedilol (COREG) tablet 3.125 mg  3.125 mg Oral BID WC Pearson Grippe, MD   3.125 mg at 02/24/17 0844  . ezetimibe (ZETIA)  tablet 10 mg  10 mg Oral Daily Pearson Grippe, MD      . isosorbide mononitrate (IMDUR) 24 hr tablet 30 mg  30 mg Oral Daily Pearson Grippe, MD      . levETIRAcetam (KEPPRA) tablet 250 mg  250 mg Oral BID Pearson Grippe, MD   250 mg at 02/24/17 0122  . piperacillin-tazobactam (ZOSYN) IVPB 3.375 g  3.375 g Intravenous Q8H Johnson, Clanford L, MD       Current Outpatient Prescriptions  Medication Sig Dispense Refill  . carvedilol (COREG) 3.125 MG tablet Take 3.125 mg by mouth 2 (two) times daily.     . Cholecalciferol (VITAMIN D) 2000 UNITS CAPS Take 2,000 Units by mouth daily.     Marland Kitchen ezetimibe (ZETIA) 10 MG tablet Take 10 mg by mouth daily.    . furosemide (LASIX) 40 MG tablet Take 20 mg by mouth 3 (three) times a week. Monday, Wednesday and Friday    . isosorbide mononitrate (IMDUR) 30 MG 24 hr tablet Take 30 mg by mouth daily.     Marland Kitchen levETIRAcetam (KEPPRA) 250 MG tablet Take 250 mg by mouth 2 (two) times daily.     Marland Kitchen lisinopril (PRINIVIL,ZESTRIL) 2.5 MG tablet Take 1 tablet (2.5 mg total) by mouth daily. 90 tablet 3  . NITROSTAT 0.4 MG SL tablet Place 0.4 mg under the tongue as needed for chest pain.     Marland Kitchen XARELTO 20 MG TABS tablet TAKE 1 TABLET BY MOUTH  DAILY 90 tablet 3    Allergies as of 02/23/2017  . (No Known Allergies)    Family History  Problem Relation Age of Onset  . Colon cancer Neg Hx     Social History   Social History  . Marital status: Single    Spouse name: N/A  . Number of children: N/A  . Years of education: N/A   Occupational History  . Retired    Social History Main Topics  . Smoking status: Former Games developer  . Smokeless tobacco: Never Used     Comment: quit 40 years ago  . Alcohol use No  . Drug use: No  . Sexual activity: Not on file   Other Topics Concern  . Not on file   Social History Narrative   Lives in [place] with family.    Review of Systems: Gen: Denies fever, chills, loss of appetite, change in weight or weight loss CV: Denies chest pain,  heart palpitations, syncope, edema  Resp: Denies shortness of breath with rest, cough, wheezing GI: see HPI  GU : Denies urinary burning, urinary frequency, urinary incontinence.  MS: see HPI  Derm: Denies rash, itching, dry skin  Psych: Denies depression, anxiety,confusion, or memory loss Heme: see HPI   Physical Exam: Vital signs in last 24 hours: Temp:  [97.7 F (36.5 C)] 97.7 F (36.5 C) (10/15 2229) Pulse Rate:  [55-66] 60 (10/16 0844) Resp:  [16-20] 16 (10/16 0841) BP: (107-127)/(66-87) 115/72 (10/16 0841) SpO2:  [89 %-100 %] 96 % (10/16 0841) Weight:  [172 lb (78 kg)] 172 lb (78 kg) (10/15 1115)   General:   Alert,  Well-developed, well-nourished, appears younger than stated age Head:  Normocephalic and atraumatic. Eyes:  Sclera clear, no icterus.    Ears:  Mild to moderate hard of hearing  Nose:  No deformity, discharge,  or lesions. Mouth:  With dentures Lungs:  Clear throughout to auscultation.   Heart:  S1 S2 present  Abdomen:  Soft, nontender and nondistended. No masses, hepatosplenomegaly or hernias noted. Normal bowel sounds, without guarding, and without rebound.   Rectal:  Deferred  Msk:  Symmetrical without gross deformities. Normal posture. Extremities:  Without edema, just above right ankle superficial skin tear  Neurologic:  Alert and  oriented x4 Psych:  Alert and cooperative. Normal mood and affect.  Intake/Output from previous day: 10/15 0701 - 10/16 0700 In: 550 [IV Piggyback:550] Out: -  Intake/Output this shift: No intake/output data recorded.  Lab Results:  Recent Labs  02/23/17 1920 02/24/17 0747  WBC 16.7* 15.3*  HGB 16.7 16.2  HCT 48.8 46.8  PLT 155 136*   BMET  Recent Labs  02/23/17 1920  NA 137  K 4.5  CL 101  CO2 20*  GLUCOSE 133*  BUN 43*  CREATININE 2.41*  CALCIUM 9.3   LFT  Recent Labs  02/23/17 1920  PROT 8.1  ALBUMIN 3.9  AST 517*  ALT 326*  ALKPHOS 276*  BILITOT 3.2*   PT/INR  Recent Labs   02/23/17 1920 02/24/17 0747  LABPROT 42.8* 34.1*  INR 4.19* 3.40    Studies/Results: Ct Abdomen Pelvis Wo Contrast  Result Date: 02/23/2017 CLINICAL DATA:  Larey Seat in bath from with leg pain EXAM: CT ABDOMEN AND PELVIS WITHOUT CONTRAST TECHNIQUE: Multidetector CT imaging of the abdomen and pelvis was performed following the standard protocol without IV contrast. COMPARISON:  Radiographs 02/23/2017, CT 08/04/2014 FINDINGS: Lower chest: Mild subpleural fibrosis. No acute consolidation or effusion. Cardiomegaly with partially visualized pacing leads. Coronary artery calcification. Dense mitral annular calcification. Hepatobiliary: Subcentimeter hypodensity within the anterior liver, not significantly changed. Interval increase in intra and extrahepatic biliary dilatation with extrahepatic bile duct measuring up to 16 mm. Multiple hyperdense foci a within the extrahepatic common bile duct, measuring up to 14 mm, consistent with stones. Pancreas: Unremarkable. No pancreatic ductal dilatation or surrounding inflammatory changes. Spleen: Normal in size without focal abnormality. Adrenals/Urinary Tract: Adrenal glands are within normal limits. Bilateral renal cysts. No hydronephrosis. Bladder unremarkable Stomach/Bowel: Stomach is nonenlarged. No dilated small bowel. No colon wall thickening. Moderate feces in the rectum. Vascular/Lymphatic: Extensive atherosclerotic calcification. Ectatic infrarenal abdominal aorta, measuring up to 2.6 cm. No significantly enlarged lymph nodes. Reproductive: Enlarged prostate gland with mass effect on the bladder Other: Negative for free air or free fluid. Musculoskeletal: No acute or suspicious bone lesion. Nonspecific sclerotic foci in the proximal right femur. Degenerative changes of the spine. Vertebral body heights appear maintained. IMPRESSION: 1. No CT evidence for acute intra-abdominal or pelvic abnormality. 2. Interval increase in intra and extrahepatic biliary dilatation  status post cholecystectomy. Development of multiple hyperdense foci within the dilated common bile duct, consistent with stones/ choledocholithiasis. 3.  Cardiomegaly.  Mild subpleural bibasilar fibrosis. 4. Ectatic infrarenal abdominal aorta up to 2.6 cm. Ectatic abdominal aorta at risk for aneurysm development. Recommend followup by ultrasound in 5 years. This recommendation follows ACR consensus guidelines: White Paper of the ACR Incidental Findings Committee II on Vascular Findings. J Am Coll Radiol 2013; 10:789-794. 5. Enlarged prostate gland with mass effect on the bladder. Clinical correlation recommended. Electronically Signed   By: Jasmine Pang M.D.   On: 02/23/2017 22:19   Dg Chest 1 View  Result Date: 02/23/2017 CLINICAL DATA:  Fall EXAM: CHEST 1 VIEW COMPARISON:  09/13/2012 FINDINGS: Left-sided single lead pacing device as before. Cardiomegaly with central congestion. Coarse interstitial opacity likely due to chronic change. No consolidation or effusion. Aortic atherosclerosis. No pneumothorax. IMPRESSION: 1. Cardiomegaly 2. Coarse bilateral interstitial opacity consistent with chronic change. No acute infiltrate or edema. Electronically Signed   By: Jasmine Pang M.D.   On: 02/23/2017 20:49   Dg Tibia/fibula Right  Result Date: 02/23/2017 CLINICAL DATA:  Fall with leg pain EXAM: RIGHT TIBIA AND FIBULA - 2 VIEW COMPARISON:  None. FINDINGS: No fracture or malalignment. Vascular calcifications. Marked arthritis at the medial compartment. Chondrocalcinosis. Patellofemoral degenerative changes. IMPRESSION: 1. No acute osseous abnormality 2. Chondrocalcinosis 3. Moderate severe arthritis of the knee Electronically Signed   By: Jasmine Pang M.D.   On: 02/23/2017 20:47   Ct Head Wo Contrast  Result Date: 02/23/2017 CLINICAL DATA:  Patient fell trying to get to the bathroom. Patient found on floor. RIGHT-sided weakness is chronic. EXAM: CT HEAD WITHOUT CONTRAST TECHNIQUE: Contiguous axial images  were obtained from the base of the skull through the vertex without intravenous contrast. COMPARISON:  CT head 09/13/2012.  MR head 05/12/2009. FINDINGS: Brain: No evidence for acute infarction, hemorrhage, mass lesion, hydrocephalus, or extra-axial fluid. Generalized atrophy. Chronic microvascular ischemic change. Encephalomalacia of the LEFT frontal, temporal, and insular regions, also involving the basal ganglia, related to chronic infarctions, which were acute in 2011. Vascular: Calcification of the cavernous internal carotid arteries consistent with cerebrovascular atherosclerotic disease. No signs of intracranial large vessel occlusion. Skull: Normal. Negative for fracture or focal lesion. Sinuses/Orbits: No acute finding.  BILATERAL cataract extraction. Other: None. IMPRESSION: Atrophy and small vessel disease.  Old LEFT MCA territory infarct. No acute intracranial findings. Electronically Signed   By: Elsie Stain M.D.   On: 02/23/2017 20:51   Ct Hip Right Wo Contrast  Result Date: 02/23/2017 CLINICAL DATA:  Right hip pain radiating to the foot after fall at 2 a.m. this morning. EXAM: CT OF THE RIGHT HIP WITHOUT CONTRAST TECHNIQUE: Multidetector CT imaging of the right hip was performed according to the standard protocol. Multiplanar CT image reconstructions were also generated. COMPARISON:  None. FINDINGS: Bones/Joint/Cartilage Axial joint space narrowing of the right hip with spurring about the femoral head- neck junction. No joint effusion, fracture dislocation. Intact right superior and inferior pubic rami and pubic symphysis. Bone island noted of the intratrochanteric portion of the right femur. Ligaments Suboptimally assessed by CT. Muscles and Tendons No intramuscular hemorrhage or edema. Intramuscular lipoma of the right iliopsoas measuring 2.3 x 1.4 x 1.9 cm. Soft tissues Soft tissue edema along the lateral aspect of the right pelvis and hip. IMPRESSION: 1. Osteoarthritis of the right hip. 2.  No acute fracture or malalignment. 3. Small 2.3 x 1.4 x 1.9 cm lipoma of the right iliopsoas. 4. Mild soft tissue contusion along the lateral aspect of the right hip and pelvis. Electronically Signed   By:  Tollie Eth M.D.   On: 02/23/2017 22:09   Dg Femur Min 2 Views Right  Result Date: 02/23/2017 CLINICAL DATA:  Fall with leg pain EXAM: RIGHT FEMUR 2 VIEWS COMPARISON:  None. FINDINGS: Extensive vascular calcifications. Pubic symphysis and rami appear intact. Questionable step-off deformity at the right femoral head neck junction. No dislocation. Mild to moderate arthritis at the right hip. IMPRESSION: 1. Questionable step-off deformity at the right femoral head neck junction, CT recommended for further evaluation. 2. Extensive vascular calcifications. Electronically Signed   By: Jasmine Pang M.D.   On: 02/23/2017 20:46    Impression: Very pleasant 83 year old male, appearing younger than stated age, presenting to the ED after prolonged time down s/p fall, found to be in ARF, elevated LFTs with evidence of CBD stones and biliary dilatation on CT, leukocytosis, concern for possible evolving cholangitis, remaining afebrile. Clinical course complicated as he is on chronic anticoagulation (Xarelto) with unknown last dose. Supratherapeutic INR on admission, with slight improvement but still elevated at 3.40. Zosyn started yesterday per pharmacy consult. Past cardiac history of complete heart block with pacemaker placed in 2012 and recently seen by Dr. Johney Brown in Sept 2018, doing well at that time. Repeat LFTs are pending. Clinically, patient reports no pain, N/V, confusion, fever/chills.   Acute hepatitis panel pending: will follow up on this as comes available.   To discuss with Dr. Darrick Penna whether patient would need transfer due to co-morbidities. Agree with antibiotics, holding Xarelto, remain NPO, and will follow labs as come available.    Plan: Await pending CMP Xarelto on hold Zosyn on board per  pharmacy Nephrology consult ordered per admitting provider Continue supportive measures Will ultimately need ERCP in the future when clinically appropriate off anticoagulation    Daniel Mink, PhD, ANP-BC [facility]     LOS: 1 day    02/24/2017, 8:52 AM  Addendum at 0930: Patient will need to be transferred to [facility]. Dr. Laural Benes, hospitalist, notified Daniel Mink, PhD, [facility]

## 2017-02-24 NOTE — ED Notes (Signed)
Pt visiting with family members at this time.

## 2017-02-25 LAB — COMPREHENSIVE METABOLIC PANEL
ALBUMIN: 3 g/dL — AB (ref 3.5–5.0)
ALT: 292 U/L — ABNORMAL HIGH (ref 17–63)
ANION GAP: 12 (ref 5–15)
AST: 283 U/L — ABNORMAL HIGH (ref 15–41)
Alkaline Phosphatase: 184 U/L — ABNORMAL HIGH (ref 38–126)
BILIRUBIN TOTAL: 3 mg/dL — AB (ref 0.3–1.2)
BUN: 35 mg/dL — ABNORMAL HIGH (ref 6–20)
CALCIUM: 8.8 mg/dL — AB (ref 8.9–10.3)
CO2: 17 mmol/L — ABNORMAL LOW (ref 22–32)
Chloride: 112 mmol/L — ABNORMAL HIGH (ref 101–111)
Creatinine, Ser: 1.56 mg/dL — ABNORMAL HIGH (ref 0.61–1.24)
GFR, EST AFRICAN AMERICAN: 45 mL/min — AB (ref >60)
GFR, EST NON AFRICAN AMERICAN: 39 mL/min — AB (ref >60)
Glucose, Bld: 84 mg/dL (ref 65–99)
POTASSIUM: 4.5 mmol/L (ref 3.5–5.1)
Sodium: 141 mmol/L (ref 135–145)
TOTAL PROTEIN: 6.4 g/dL — AB (ref 6.5–8.1)

## 2017-02-25 LAB — HEPATITIS PANEL, ACUTE
HEP A IGM: NEGATIVE
Hep B C IgM: NEGATIVE
Hepatitis B Surface Ag: NEGATIVE

## 2017-02-25 LAB — CBC WITH DIFFERENTIAL/PLATELET
BASOS PCT: 0 %
Basophils Absolute: 0 10*3/uL (ref 0.0–0.1)
EOS ABS: 0 10*3/uL (ref 0.0–0.7)
Eosinophils Relative: 0 %
HCT: 48.5 % (ref 39.0–52.0)
HEMOGLOBIN: 16.6 g/dL (ref 13.0–17.0)
Lymphocytes Relative: 5 %
Lymphs Abs: 0.7 10*3/uL (ref 0.7–4.0)
MCH: 31.9 pg (ref 26.0–34.0)
MCHC: 34.2 g/dL (ref 30.0–36.0)
MCV: 93.3 fL (ref 78.0–100.0)
Monocytes Absolute: 1.4 10*3/uL — ABNORMAL HIGH (ref 0.1–1.0)
Monocytes Relative: 11 %
NEUTROS ABS: 11 10*3/uL — AB (ref 1.7–7.7)
NEUTROS PCT: 84 %
PLATELETS: 124 10*3/uL — AB (ref 150–400)
RBC: 5.2 MIL/uL (ref 4.22–5.81)
RDW: 14.9 % (ref 11.5–15.5)
WBC: 13.1 10*3/uL — AB (ref 4.0–10.5)

## 2017-02-25 LAB — CALCIUM / CREATININE RATIO, URINE
CALCIUM/CREAT. RATIO: 8 mg/g{creat} (ref 0–260)
CREATININE, UR: 98.8 mg/dL
Calcium, Ur: 0.8 mg/dL

## 2017-02-25 LAB — LIPASE, BLOOD: LIPASE: 28 U/L (ref 11–51)

## 2017-02-25 LAB — PROTIME-INR
INR: 1.77
PROTHROMBIN TIME: 20.4 s — AB (ref 11.4–15.2)

## 2017-02-25 LAB — CK: CK TOTAL: 2041 U/L — AB (ref 49–397)

## 2017-02-25 MED ORDER — SODIUM CHLORIDE 0.9 % IV SOLN
INTRAVENOUS | Status: AC
  Administered 2017-02-25: 02:00:00 via INTRAVENOUS

## 2017-02-25 MED ORDER — HYDROCODONE-ACETAMINOPHEN 5-325 MG PO TABS
1.0000 | ORAL_TABLET | ORAL | Status: DC | PRN
  Administered 2017-02-25: 1 via ORAL
  Filled 2017-02-25: qty 1

## 2017-02-25 MED ORDER — ORAL CARE MOUTH RINSE
15.0000 mL | Freq: Two times a day (BID) | OROMUCOSAL | Status: DC
  Administered 2017-02-25 – 2017-03-03 (×5): 15 mL via OROMUCOSAL

## 2017-02-25 MED ORDER — SODIUM CHLORIDE 0.9 % IV SOLN
INTRAVENOUS | Status: AC
  Administered 2017-02-25 – 2017-02-26 (×2): via INTRAVENOUS

## 2017-02-25 NOTE — Consult Note (Signed)
Referring Provider: Dr. Waymon Amato Primary Care Physician:  [name], MD (Inactive) Primary Gastroenterologist:  Gentry Fitz  Reason for Consultation:  Choledocholithiasis; Elevated liver enzymes  HPI: Daniel Brown is a 83 y.o. male with multiple medical problems as stated below and on Xarelto for Afib prior to admit to [facility] who was found down at his home in [place] and brought to [facility] where he was found to have acute renal failure due to rhabdomyolysis with a CK of 14,105 and TB 3.2, ALP 276, AST 517, ALT 326. Lipase 25, WBC 16.7. Noncontrast CT showed multiple hyperdensities in the distal CBD that was dilated and increase in the intrahepatic and extrahepatic biliary dilation. S/P cholecystectomy (he does not recall when it was removed). Denies any abdominal pain now or prior to going to [facility]. Denies N/V. Elevated INR of 4.19 yesterday. He was on Xarelto for Afib. He was transferred to [facility] due to his comorbidities and report that anesthesia not willing to sedate for needed ERCP. He is oriented to person and time.  Past Medical History:  Diagnosis Date  . CAD (coronary artery disease)    by cath 1/11, medical management advised  . Cardiomyopathy, ischemic    EF 40% by echo 1/11  . Complete heart block (HCC)    s/p SJM PPM by [facility] 01/10/11  . COPD (chronic obstructive pulmonary disease) (HCC)   . CVD (cardiovascular disease)    s/p MCA stroke 1/11  . HTN (hypertension)   . Hyperlipidemia   . Permanent atrial fibrillation (HCC)   . Seizure disorder (HCC)   . Stroke [facility])     Past Surgical History:  Procedure Laterality Date  . CHOLECYSTECTOMY    . PACEMAKER INSERTION  01/10/11   by Fawn Kirk for CHB    Prior to Admission medications   Medication Sig Start Date End Date Taking? Authorizing Provider  carvedilol (COREG) 3.125 MG tablet Take 3.125 mg by mouth 2 (two) times daily.    Yes [provider]  Cholecalciferol (VITAMIN D) 2000 UNITS CAPS Take 2,000  Units by mouth daily.    Yes [provider]  ezetimibe (ZETIA) 10 MG tablet Take 10 mg by mouth daily.   Yes [provider]  furosemide (LASIX) 40 MG tablet Take 20 mg by mouth 3 (three) times a week. Monday, Wednesday and Friday   Yes [provider]  isosorbide mononitrate (IMDUR) 30 MG 24 hr tablet Take 30 mg by mouth daily.    Yes [provider]  levETIRAcetam (KEPPRA) 250 MG tablet Take 250 mg by mouth 2 (two) times daily.    Yes [provider]  lisinopril (PRINIVIL,ZESTRIL) 2.5 MG tablet Take 1 tablet (2.5 mg total) by mouth daily. 01/13/17  Yes Allred, Fayrene Fearing, MD  NITROSTAT 0.4 MG SL tablet Place 0.4 mg under the tongue as needed for chest pain.  01/17/11  Yes [provider]  XARELTO 20 MG TABS tablet TAKE 1 TABLET BY MOUTH  DAILY 08/25/16  Yes Allred, Fayrene Fearing, MD    Scheduled Meds: . carvedilol  3.125 mg Oral BID WC  . ezetimibe  10 mg Oral Daily  . isosorbide mononitrate  30 mg Oral Daily  . levETIRAcetam  250 mg Oral BID  . mouth rinse  15 mL Mouth Rinse BID   Continuous Infusions: . piperacillin-tazobactam (ZOSYN)  IV 3.375 g (02/25/17 0905)   PRN Meds:.acetaminophen **OR** acetaminophen  Allergies as of 02/23/2017  . (No Known Allergies)    Family History  Problem Relation Age of Onset  . Colon cancer Neg Hx     Social History   Social History  . Marital status: Single    Spouse name: N/A  . Number of children: N/A  . Years of education: N/A   Occupational History  . Retired    Social History Main Topics  . Smoking status: Former Games developermoker  . Smokeless tobacco: Never Used     Comment: quit 40 years ago  . Alcohol use No  . Drug use: No  . Sexual activity: Not on file   Other Topics Concern  . Not on file   Social History Narrative   [place].    Review of Systems: All negative except as stated above in HPI.  Physical Exam: Vital signs: Vitals:   02/24/17 2033 02/25/17  0421  BP: 115/68 138/78  Pulse: 84 63  Resp: 17 18  Temp: 97.9 F (36.6 C) (!) 97.5 F (36.4 C)  SpO2: 99% 97%   Last BM Date: 02/21/17 General:  Elderly, Alert,  Well-developed, well-nourished, pleasant and cooperative in NAD Head: normocephalic, atraumatic Eyes: anicteric sclera ENT: oropharynx clear Neck: supple, nontender Lungs:  Clear throughout to auscultation.   No wheezes, crackles, or rhonchi. No acute distress. Heart:  Regular rate and rhythm; no murmurs, clicks, rubs,  or gallops. Abdomen: soft, nontender, nondistended, +BS  Rectal:  Deferred Ext: no edema  GI:  Lab Results:  Recent Labs  02/23/17 1920 02/24/17 0747  WBC 16.7* 15.3*  HGB 16.7 16.2  HCT 48.8 46.8  PLT 155 136*   BMET  Recent Labs  02/23/17 1920 02/24/17 0747  NA 137 138  K 4.5 4.0  CL 101 105  CO2 20* 19*  GLUCOSE 133* 100*  BUN 43* 44*  CREATININE 2.41* 2.03*  CALCIUM 9.3 8.6*   LFT  Recent Labs  02/24/17 0747  PROT 6.4*  ALBUMIN 3.1*  AST 361*  ALT 268*  ALKPHOS 203*  BILITOT 2.6*   PT/INR  Recent Labs  02/23/17 1920 02/24/17 0747  LABPROT 42.8* 34.1*  INR 4.19* 3.40     Studies/Results: Ct Abdomen Pelvis Wo Contrast  Result Date: 02/23/2017 CLINICAL DATA:  Larey SeatFell in bath from with leg pain EXAM: CT ABDOMEN AND PELVIS WITHOUT CONTRAST TECHNIQUE: Multidetector CT imaging of the abdomen and pelvis was performed following the standard protocol without IV contrast. COMPARISON:  Radiographs 02/23/2017, CT 08/04/2014 FINDINGS: Lower chest: Mild subpleural fibrosis. No acute consolidation or effusion. Cardiomegaly with partially visualized pacing leads. Coronary artery calcification. Dense mitral annular calcification. Hepatobiliary: Subcentimeter hypodensity within the anterior liver, not significantly changed. Interval increase in intra and extrahepatic biliary dilatation with extrahepatic bile duct measuring up to 16 mm. Multiple hyperdense foci a within the  extrahepatic common bile duct, measuring up to 14 mm, consistent with stones. Pancreas: Unremarkable. No pancreatic ductal dilatation or surrounding inflammatory changes. Spleen: Normal in size without focal abnormality. Adrenals/Urinary Tract: Adrenal glands are within normal limits. Bilateral renal cysts. No hydronephrosis. Bladder unremarkable Stomach/Bowel: Stomach is nonenlarged. No dilated small bowel. No colon wall thickening. Moderate feces in the rectum. Vascular/Lymphatic: Extensive atherosclerotic calcification. Ectatic infrarenal abdominal aorta, measuring up to 2.6 cm. No significantly enlarged lymph nodes. Reproductive: Enlarged prostate gland with mass effect on the bladder Other: Negative for free air or free fluid. Musculoskeletal: No acute or suspicious bone lesion. Nonspecific sclerotic foci in the proximal right femur. Degenerative changes of the spine. Vertebral body heights appear maintained. IMPRESSION: 1. No  CT evidence for acute intra-abdominal or pelvic abnormality. 2. Interval increase in intra and extrahepatic biliary dilatation status post cholecystectomy. Development of multiple hyperdense foci within the dilated common bile duct, consistent with stones/ choledocholithiasis. 3. Cardiomegaly.  Mild subpleural bibasilar fibrosis. 4. Ectatic infrarenal abdominal aorta up to 2.6 cm. Ectatic abdominal aorta at risk for aneurysm development. Recommend followup by ultrasound in 5 years. This recommendation follows ACR consensus guidelines: White Paper of the ACR Incidental Findings Committee II on Vascular Findings. J Am Coll Radiol 2013; 10:789-794. 5. Enlarged prostate gland with mass effect on the bladder. Clinical correlation recommended. Electronically Signed   By: Jasmine Pang M.D.   On: 02/23/2017 22:19   Dg Chest 1 View  Result Date: 02/23/2017 CLINICAL DATA:  Fall EXAM: CHEST 1 VIEW COMPARISON:  09/13/2012 FINDINGS: Left-sided single lead pacing device as before. Cardiomegaly  with central congestion. Coarse interstitial opacity likely due to chronic change. No consolidation or effusion. Aortic atherosclerosis. No pneumothorax. IMPRESSION: 1. Cardiomegaly 2. Coarse bilateral interstitial opacity consistent with chronic change. No acute infiltrate or edema. Electronically Signed   By: Jasmine Pang M.D.   On: 02/23/2017 20:49   Dg Tibia/fibula Right  Result Date: 02/23/2017 CLINICAL DATA:  Fall with leg pain EXAM: RIGHT TIBIA AND FIBULA - 2 VIEW COMPARISON:  None. FINDINGS: No fracture or malalignment. Vascular calcifications. Marked arthritis at the medial compartment. Chondrocalcinosis. Patellofemoral degenerative changes. IMPRESSION: 1. No acute osseous abnormality 2. Chondrocalcinosis 3. Moderate severe arthritis of the knee Electronically Signed   By: Jasmine Pang M.D.   On: 02/23/2017 20:47   Ct Head Wo Contrast  Result Date: 02/23/2017 CLINICAL DATA:  Patient fell trying to get to the bathroom. Patient found on floor. RIGHT-sided weakness is chronic. EXAM: CT HEAD WITHOUT CONTRAST TECHNIQUE: Contiguous axial images were obtained from the base of the skull through the vertex without intravenous contrast. COMPARISON:  CT head 09/13/2012.  MR head 05/12/2009. FINDINGS: Brain: No evidence for acute infarction, hemorrhage, mass lesion, hydrocephalus, or extra-axial fluid. Generalized atrophy. Chronic microvascular ischemic change. Encephalomalacia of the LEFT frontal, temporal, and insular regions, also involving the basal ganglia, related to chronic infarctions, which were acute in 2011. Vascular: Calcification of the cavernous internal carotid arteries consistent with cerebrovascular atherosclerotic disease. No signs of intracranial large vessel occlusion. Skull: Normal. Negative for fracture or focal lesion. Sinuses/Orbits: No acute finding.  BILATERAL cataract extraction. Other: None. IMPRESSION: Atrophy and small vessel disease.  Old LEFT MCA territory infarct. No acute  intracranial findings. Electronically Signed   By: Elsie Stain M.D.   On: 02/23/2017 20:51   US Renal  Result Date: 02/24/2017 CLINICAL DATA:  Acute renal failure. EXAM: RENAL / URINARY TRACT ULTRASOUND COMPLETE COMPARISON:  CT abdomen pelvis dated February 23, 2017. FINDINGS: Right Kidney: Length: 10.6 cm. Echogenicity within normal limits. No mass or hydronephrosis visualized. There is a 3.3 x 2.3 x 2.3 cm simple cyst arising from the superior pole. Left Kidney: Length: 12.3 cm. Echogenicity within normal limits. No mass or hydronephrosis visualized. There is a 10.1 x 7.5 x 7.8 cm simple appearing cyst arising from the superior pole. Bladder: Appears normal for degree of bladder distention. IMPRESSION: No acute abnormality.  Bilateral renal cysts. Electronically Signed   By: Obie Dredge M.D.   On: 02/24/2017 10:17   Ct Hip Right Wo Contrast  Result Date: 02/23/2017 CLINICAL DATA:  Right hip pain radiating to the foot after fall at 2 a.m. this morning. EXAM: CT OF THE RIGHT HIP  WITHOUT CONTRAST TECHNIQUE: Multidetector CT imaging of the right hip was performed according to the standard protocol. Multiplanar CT image reconstructions were also generated. COMPARISON:  None. FINDINGS: Bones/Joint/Cartilage Axial joint space narrowing of the right hip with spurring about the femoral head- neck junction. No joint effusion, fracture dislocation. Intact right superior and inferior pubic rami and pubic symphysis. Bone island noted of the intratrochanteric portion of the right femur. Ligaments Suboptimally assessed by CT. Muscles and Tendons No intramuscular hemorrhage or edema. Intramuscular lipoma of the right iliopsoas measuring 2.3 x 1.4 x 1.9 cm. Soft tissues Soft tissue edema along the lateral aspect of the right pelvis and hip. IMPRESSION: 1. Osteoarthritis of the right hip. 2. No acute fracture or malalignment. 3. Small 2.3 x 1.4 x 1.9 cm lipoma of the right iliopsoas. 4. Mild soft tissue contusion  along the lateral aspect of the right hip and pelvis. Electronically Signed   By: Tollie Eth M.D.   On: 02/23/2017 22:09   Dg Femur Min 2 Views Right  Result Date: 02/23/2017 CLINICAL DATA:  Fall with leg pain EXAM: RIGHT FEMUR 2 VIEWS COMPARISON:  None. FINDINGS: Extensive vascular calcifications. Pubic symphysis and rami appear intact. Questionable step-off deformity at the right femoral head neck junction. No dislocation. Mild to moderate arthritis at the right hip. IMPRESSION: 1. Questionable step-off deformity at the right femoral head neck junction, CT recommended for further evaluation. 2. Extensive vascular calcifications. Electronically Signed   By: Jasmine Pang M.D.   On: 02/23/2017 20:46    Impression/Plan: 83 yo with rhabdomyolysis and elevated liver enzymes that are probably mainly elevated due to rhabdomyolysis but small CBD stones with ductal dilation seen on a noncontrasted CT scan. Asymptomatic and doubt cholangitis. ERCP needed due to CT scan findings. Elevated INR and awaiting updated INR level. Labs from today pending. Clear liquid diet. NPO p MN. Continue IV Zosyn. Dr. Ewing Schlein to do ERCP tomorrow.    LOS: 2 days   Daniel Brown C.  02/25/2017, 9:07 AM  Pager [phone]  AFTER 5 pm or on weekends please call [phone]

## 2017-02-25 NOTE — Progress Notes (Signed)
PROGRESS NOTE   ERSKINE STEINFELDT  ZOX:[id number]    DOB: 1927-10-02    DOA: 02/23/2017  PCP: [name], MD (Inactive)   I have briefly reviewed patients previous medical records in [facility].  Brief Narrative:  83 year old male with PMH of CAD, ischemic cardiomyopathy, complete heart block status post PPM, COPD, CVA, HTN, HLD, permanent A. Fib on Xarelto, seizure disorder, sustained a mechanical fall at home and was down for several hours, presented to [facility] initially with acute kidney injury due to rhabdomyolysis and dehydration, UTI, abnormal LFTs and an incidental CBD stones on CT abdomen. Transferred to [facility] for GI evaluation.   Assessment & Plan:   Principal Problem:   Choledocholithiasis Active Problems:   Atrial fibrillation (HCC)   ARF (acute renal failure) (HCC)   UTI (urinary tract infection)   Elevated troponin   Pressure injury of skin   Acute kidney injury - Due to rhabdomyolysis and dehydration. Improving with IVF, continue. Follow BMP in a.m. Renal ultrasound without acute abnormality.  Rhabdomyolysis - Sustained following fall at home and was down for several hours. CK on admission >14,000. Treating with IV fluids and CK was down to 7400 yesterday. Follow CK today and daily.  Choledocholithiasis - Patient is status post cholecystectomy. No GI symptoms now or earlier. GI input appreciated. Agree that his abnormal LFTs are likely due to rhabdomyolysis rather than gallstones. However due to CT findings, ERCP planned for 10/18 pending improvement in INR. Low index of suspicion for cholangitis but remains on IV Zosyn.  Elevated troponin Likely due to significant rhabdomyolysis and acute kidney injury. No chest pain reported.  UTI, suspected Remains on IV Zosyn.  Permanent A. fib Rate controlled on carvedilol. Xarelto on hold for pending procedure. Elevated INR likely secondary to Xarelto.  Essential hypertension Controlled. Continue Imdur and carvedilol.  Holding lisinopril due to acute kidney injury.  Hyperlipidemia Continues Zetia  Seizure disorder Continue Keppra. Denies recent seizures.  Thrombocytopenia Likely related to rhabdomyolysis. Follow CBCs.  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  DVT prophylaxis: SCD's Code Status: DO NOT RESUSCITATE Family Communication: None at bedside Disposition: DC home when medically improved   Consultants:  [facility]   Procedures:  None  Antimicrobials:  IV Zosyn    Subjective: Reports feeling better. Denies having nausea, vomiting, abdominal pain or diarrhea during the course of this hospitalization. Reports right lower extremity pain from fall but improving.  ROS: As above  Objective:  Vitals:   02/24/17 1916 02/24/17 2033 02/25/17 0421 02/25/17 1456  BP: 112/79 115/68 138/78 119/76  Pulse: 68 84 63 68  Resp: 18 17 18 18   Temp: 97.6 F (36.4 C) 97.9 F (36.6 C) (!) 97.5 F (36.4 C) 98.3 F (36.8 C)  TempSrc: Oral Oral  Oral  SpO2: 94% 99% 97% 96%  Weight:  81 kg (178 lb 9.2 oz) 81 kg (178 lb 9.2 oz)   Height:  5\' 9"  (1.753 m)      Examination:  General exam: Elderly male, moderately built and nourished lying comfortably propped up in bed. Respiratory system: Clear to auscultation. Respiratory effort normal. Cardiovascular system: S1 & S2 heard, irregularly irregular. No JVD, murmurs, rubs, gallops or clicks. No pedal edema. Telemetry: A. fib. On demand V pacing. Gastrointestinal system: Abdomen is nondistended, soft and nontender. No organomegaly or masses felt. Normal bowel sounds heard.  Central nervous system: Alert and oriented. No focal neurological deficits. Extremities: Symmetric 5 x 5 power. Right leg with 2 superficial cuts with dressing clean and dry. Trace right lower extremity edema and mild right knee swelling without  acute findings. Slightly painful range of movements. Skin: No rashes, lesions or ulcers Psychiatry: Judgement and insight appear normal. Mood & affect appropriate.     Data Reviewed: I have personally reviewed following labs and imaging studies  CBC:  Recent Labs Lab 02/23/17 1920 02/24/17 0747 02/25/17 0950  WBC 16.7* 15.3* 13.1*  NEUTROABS 14.8*  --  11.0*  HGB 16.7 16.2 16.6  HCT 48.8 46.8 48.5  MCV 94.0 91.9 93.3  PLT 155 136* 124*   Basic Metabolic Panel:  Recent Labs Lab 02/23/17 1920 02/24/17 0747 02/25/17 0950  NA 137 138 141  K 4.5 4.0 4.5  CL 101 105 112*  CO2 20* 19* 17*  GLUCOSE 133* 100* 84  BUN 43* 44* 35*  CREATININE 2.41* 2.03* 1.56*  CALCIUM 9.3 8.6* 8.8*   Liver Function Tests:  Recent Labs Lab 02/23/17 1920 02/24/17 0747 02/25/17 0950  AST 517* 361* 283*  ALT 326* 268* 292*  ALKPHOS 276* 203* 184*  BILITOT 3.2* 2.6* 3.0*  PROT 8.1 6.4* 6.4*  ALBUMIN 3.9 3.1* 3.0*   Coagulation Profile:  Recent Labs Lab 02/23/17 1920 02/24/17 0747 02/25/17 0950  INR 4.19* 3.40 1.77   Cardiac Enzymes:  Recent Labs Lab 02/23/17 1920 02/24/17 0747  CKTOTAL 14,105* 7,397*  7,397*  CKMB  --  133.5*  TROPONINI 0.49* 0.28*        Radiology Studies: Ct Abdomen Pelvis Wo Contrast  Result Date: 02/23/2017 CLINICAL DATA:  Larey SeatFell in bath from with leg pain EXAM: CT ABDOMEN AND PELVIS WITHOUT CONTRAST TECHNIQUE: Multidetector CT imaging of the abdomen and pelvis was performed following the standard protocol without IV contrast. COMPARISON:  Radiographs 02/23/2017, CT 08/04/2014 FINDINGS: Lower chest: Mild subpleural fibrosis. No acute consolidation or effusion. Cardiomegaly with partially visualized pacing leads. Coronary artery calcification. Dense mitral annular calcification. Hepatobiliary: Subcentimeter hypodensity within the anterior liver, not significantly changed. Interval increase in intra and extrahepatic biliary dilatation with  extrahepatic bile duct measuring up to 16 mm. Multiple hyperdense foci a within the extrahepatic common bile duct, measuring up to 14 mm, consistent with stones. Pancreas: Unremarkable. No pancreatic ductal dilatation or surrounding inflammatory changes. Spleen: Normal in size without focal abnormality. Adrenals/Urinary Tract: Adrenal glands are within normal limits. Bilateral renal cysts. No hydronephrosis. Bladder unremarkable Stomach/Bowel: Stomach is nonenlarged. No dilated small bowel. No colon wall thickening. Moderate feces in the rectum. Vascular/Lymphatic: Extensive atherosclerotic calcification. Ectatic infrarenal abdominal aorta, measuring up to 2.6 cm. No significantly enlarged lymph nodes. Reproductive: Enlarged prostate gland with mass effect on the bladder Other: Negative for free air or free fluid. Musculoskeletal: No acute or suspicious bone lesion. Nonspecific sclerotic foci in the proximal right femur. Degenerative changes of the spine. Vertebral body heights appear maintained. IMPRESSION: 1. No CT evidence for acute intra-abdominal or pelvic abnormality. 2. Interval increase in intra and extrahepatic biliary dilatation status post cholecystectomy. Development of multiple hyperdense foci within the dilated common bile duct, consistent with stones/ choledocholithiasis. 3. Cardiomegaly.  Mild subpleural bibasilar fibrosis. 4. Ectatic infrarenal abdominal aorta up to 2.6 cm. Ectatic abdominal aorta at risk for aneurysm development. Recommend followup by ultrasound in 5 years. This recommendation follows ACR consensus guidelines: White Paper of the ACR Incidental Findings Committee II on Vascular Findings. J Am Coll Radiol 2013; 10:789-794.  5. Enlarged prostate gland with mass effect on the bladder. Clinical correlation recommended. Electronically Signed   By: Jasmine Pang M.D.   On: 02/23/2017 22:19   Dg Chest 1 View  Result Date: 02/23/2017 CLINICAL DATA:  Fall EXAM: CHEST 1 VIEW COMPARISON:   09/13/2012 FINDINGS: Left-sided single lead pacing device as before. Cardiomegaly with central congestion. Coarse interstitial opacity likely due to chronic change. No consolidation or effusion. Aortic atherosclerosis. No pneumothorax. IMPRESSION: 1. Cardiomegaly 2. Coarse bilateral interstitial opacity consistent with chronic change. No acute infiltrate or edema. Electronically Signed   By: Jasmine Pang M.D.   On: 02/23/2017 20:49   Dg Tibia/fibula Right  Result Date: 02/23/2017 CLINICAL DATA:  Fall with leg pain EXAM: RIGHT TIBIA AND FIBULA - 2 VIEW COMPARISON:  None. FINDINGS: No fracture or malalignment. Vascular calcifications. Marked arthritis at the medial compartment. Chondrocalcinosis. Patellofemoral degenerative changes. IMPRESSION: 1. No acute osseous abnormality 2. Chondrocalcinosis 3. Moderate severe arthritis of the knee Electronically Signed   By: Jasmine Pang M.D.   On: 02/23/2017 20:47   Ct Head Wo Contrast  Result Date: 02/23/2017 CLINICAL DATA:  Patient fell trying to get to the bathroom. Patient found on floor. RIGHT-sided weakness is chronic. EXAM: CT HEAD WITHOUT CONTRAST TECHNIQUE: Contiguous axial images were obtained from the base of the skull through the vertex without intravenous contrast. COMPARISON:  CT head 09/13/2012.  MR head 05/12/2009. FINDINGS: Brain: No evidence for acute infarction, hemorrhage, mass lesion, hydrocephalus, or extra-axial fluid. Generalized atrophy. Chronic microvascular ischemic change. Encephalomalacia of the LEFT frontal, temporal, and insular regions, also involving the basal ganglia, related to chronic infarctions, which were acute in 2011. Vascular: Calcification of the cavernous internal carotid arteries consistent with cerebrovascular atherosclerotic disease. No signs of intracranial large vessel occlusion. Skull: Normal. Negative for fracture or focal lesion. Sinuses/Orbits: No acute finding.  BILATERAL cataract extraction. Other: None.  IMPRESSION: Atrophy and small vessel disease.  Old LEFT MCA territory infarct. No acute intracranial findings. Electronically Signed   By: Elsie Stain M.D.   On: 02/23/2017 20:51   US Renal  Result Date: 02/24/2017 CLINICAL DATA:  Acute renal failure. EXAM: RENAL / URINARY TRACT ULTRASOUND COMPLETE COMPARISON:  CT abdomen pelvis dated February 23, 2017. FINDINGS: Right Kidney: Length: 10.6 cm. Echogenicity within normal limits. No mass or hydronephrosis visualized. There is a 3.3 x 2.3 x 2.3 cm simple cyst arising from the superior pole. Left Kidney: Length: 12.3 cm. Echogenicity within normal limits. No mass or hydronephrosis visualized. There is a 10.1 x 7.5 x 7.8 cm simple appearing cyst arising from the superior pole. Bladder: Appears normal for degree of bladder distention. IMPRESSION: No acute abnormality.  Bilateral renal cysts. Electronically Signed   By: Obie Dredge M.D.   On: 02/24/2017 10:17   Ct Hip Right Wo Contrast  Result Date: 02/23/2017 CLINICAL DATA:  Right hip pain radiating to the foot after fall at 2 a.m. this morning. EXAM: CT OF THE RIGHT HIP WITHOUT CONTRAST TECHNIQUE: Multidetector CT imaging of the right hip was performed according to the standard protocol. Multiplanar CT image reconstructions were also generated. COMPARISON:  None. FINDINGS: Bones/Joint/Cartilage Axial joint space narrowing of the right hip with spurring about the femoral head- neck junction. No joint effusion, fracture dislocation. Intact right superior and inferior pubic rami and pubic symphysis. Bone island noted of the intratrochanteric portion of the right femur. Ligaments Suboptimally assessed by CT. Muscles and Tendons No intramuscular hemorrhage or edema. Intramuscular lipoma of the right iliopsoas  measuring 2.3 x 1.4 x 1.9 cm. Soft tissues Soft tissue edema along the lateral aspect of the right pelvis and hip. IMPRESSION: 1. Osteoarthritis of the right hip. 2. No acute fracture or malalignment. 3.  Small 2.3 x 1.4 x 1.9 cm lipoma of the right iliopsoas. 4. Mild soft tissue contusion along the lateral aspect of the right hip and pelvis. Electronically Signed   By: Tollie Eth M.D.   On: 02/23/2017 22:09   Dg Femur Min 2 Views Right  Result Date: 02/23/2017 CLINICAL DATA:  Fall with leg pain EXAM: RIGHT FEMUR 2 VIEWS COMPARISON:  None. FINDINGS: Extensive vascular calcifications. Pubic symphysis and rami appear intact. Questionable step-off deformity at the right femoral head neck junction. No dislocation. Mild to moderate arthritis at the right hip. IMPRESSION: 1. Questionable step-off deformity at the right femoral head neck junction, CT recommended for further evaluation. 2. Extensive vascular calcifications. Electronically Signed   By: Jasmine Pang M.D.   On: 02/23/2017 20:46        Scheduled Meds: . carvedilol  3.125 mg Oral BID WC  . ezetimibe  10 mg Oral Daily  . isosorbide mononitrate  30 mg Oral Daily  . levETIRAcetam  250 mg Oral BID  . mouth rinse  15 mL Mouth Rinse BID   Continuous Infusions: . piperacillin-tazobactam (ZOSYN)  IV Stopped (02/25/17 1305)     LOS: 2 days     Shatavia Santor, MD, FACP, FHM. [facility] Pager [phone]  If 7PM-7AM, please contact night-coverage [email or web address] Password [email or web address] 02/25/2017, 6:10 PM

## 2017-02-26 ENCOUNTER — Inpatient Hospital Stay (HOSPITAL_COMMUNITY): Payer: Medicare Other | Admitting: Certified Registered"

## 2017-02-26 ENCOUNTER — Inpatient Hospital Stay (HOSPITAL_COMMUNITY): Payer: Medicare Other

## 2017-02-26 ENCOUNTER — Encounter (HOSPITAL_COMMUNITY): Payer: Self-pay | Admitting: *Deleted

## 2017-02-26 ENCOUNTER — Encounter (HOSPITAL_COMMUNITY)
Admission: EM | Disposition: A | Discharge status: Home / Self Care | Payer: Self-pay | Source: Home / Self Care | Attending: Internal Medicine

## 2017-02-26 HISTORY — PX: ERCP: SHX5425

## 2017-02-26 LAB — CBC
HEMATOCRIT: 44.6 % (ref 39.0–52.0)
Hemoglobin: 14.7 g/dL (ref 13.0–17.0)
MCH: 30.6 pg (ref 26.0–34.0)
MCHC: 33 g/dL (ref 30.0–36.0)
MCV: 92.7 fL (ref 78.0–100.0)
Platelets: 141 10*3/uL — ABNORMAL LOW (ref 150–400)
RBC: 4.81 MIL/uL (ref 4.22–5.81)
RDW: 14.5 % (ref 11.5–15.5)
WBC: 8.9 10*3/uL (ref 4.0–10.5)

## 2017-02-26 LAB — COMPREHENSIVE METABOLIC PANEL
ALT: 289 U/L — ABNORMAL HIGH (ref 17–63)
ANION GAP: 10 (ref 5–15)
AST: 249 U/L — ABNORMAL HIGH (ref 15–41)
Albumin: 2.7 g/dL — ABNORMAL LOW (ref 3.5–5.0)
Alkaline Phosphatase: 154 U/L — ABNORMAL HIGH (ref 38–126)
BUN: 28 mg/dL — ABNORMAL HIGH (ref 6–20)
CHLORIDE: 107 mmol/L (ref 101–111)
CO2: 19 mmol/L — ABNORMAL LOW (ref 22–32)
Calcium: 8.2 mg/dL — ABNORMAL LOW (ref 8.9–10.3)
Creatinine, Ser: 1.32 mg/dL — ABNORMAL HIGH (ref 0.61–1.24)
GFR, EST AFRICAN AMERICAN: 55 mL/min — AB (ref >60)
GFR, EST NON AFRICAN AMERICAN: 47 mL/min — AB (ref >60)
Glucose, Bld: 103 mg/dL — ABNORMAL HIGH (ref 65–99)
POTASSIUM: 3.8 mmol/L (ref 3.5–5.1)
Sodium: 136 mmol/L (ref 135–145)
Total Bilirubin: 2.4 mg/dL — ABNORMAL HIGH (ref 0.3–1.2)
Total Protein: 5.7 g/dL — ABNORMAL LOW (ref 6.5–8.1)

## 2017-02-26 LAB — GLUCOSE, CAPILLARY: Glucose-Capillary: 91 mg/dL (ref 65–99)

## 2017-02-26 LAB — CK: CK TOTAL: 1522 U/L — AB (ref 49–397)

## 2017-02-26 SURGERY — ERCP, WITH INTERVENTION IF INDICATED
Anesthesia: General

## 2017-02-26 MED ORDER — FENTANYL CITRATE (PF) 100 MCG/2ML IJ SOLN: 25.0000 ug | INTRAMUSCULAR | Status: DC | PRN

## 2017-02-26 MED ORDER — SUGAMMADEX SODIUM 200 MG/2ML IV SOLN
INTRAVENOUS | Status: DC | PRN
  Administered 2017-02-26: 157.8 mg via INTRAVENOUS

## 2017-02-26 MED ORDER — MEPERIDINE HCL 25 MG/ML IJ SOLN: 6.2500 mg | INTRAMUSCULAR | Status: DC | PRN

## 2017-02-26 MED ORDER — SODIUM CHLORIDE 0.9 % IV SOLN
INTRAVENOUS | Status: AC
  Administered 2017-02-26 – 2017-02-27 (×2): via INTRAVENOUS

## 2017-02-26 MED ORDER — ONDANSETRON HCL 4 MG/2ML IJ SOLN: 4.0000 mg | Freq: Once | INTRAMUSCULAR | Status: DC | PRN

## 2017-02-26 MED ORDER — PROPOFOL 10 MG/ML IV BOLUS
INTRAVENOUS | Status: DC | PRN
  Administered 2017-02-26: 100 mg via INTRAVENOUS

## 2017-02-26 MED ORDER — INDOMETHACIN 50 MG RE SUPP
RECTAL | Status: AC
  Filled 2017-02-26: qty 2

## 2017-02-26 MED ORDER — INDOMETHACIN 50 MG RE SUPP
RECTAL | Status: DC | PRN
Start: 2017-02-26 — End: 2017-02-26
  Administered 2017-02-26: 100 mg via RECTAL

## 2017-02-26 MED ORDER — LACTATED RINGERS IV SOLN
INTRAVENOUS | Status: AC | PRN
  Administered 2017-02-26: 1000 mL via INTRAVENOUS

## 2017-02-26 MED ORDER — GLUCAGON HCL RDNA (DIAGNOSTIC) 1 MG IJ SOLR
INTRAMUSCULAR | Status: AC
  Filled 2017-02-26: qty 1

## 2017-02-26 MED ORDER — LACTATED RINGERS IV SOLN
INTRAVENOUS | Status: DC | PRN
  Administered 2017-02-26 (×2): via INTRAVENOUS

## 2017-02-26 MED ORDER — LIDOCAINE 2% (20 MG/ML) 5 ML SYRINGE
INTRAMUSCULAR | Status: DC | PRN
  Administered 2017-02-26: 60 mg via INTRAVENOUS

## 2017-02-26 MED ORDER — PHENYLEPHRINE 40 MCG/ML (10ML) SYRINGE FOR IV PUSH (FOR BLOOD PRESSURE SUPPORT)
PREFILLED_SYRINGE | INTRAVENOUS | Status: DC | PRN
  Administered 2017-02-26: 120 ug via INTRAVENOUS
  Administered 2017-02-26: 40 ug via INTRAVENOUS
  Administered 2017-02-26: 80 ug via INTRAVENOUS

## 2017-02-26 MED ORDER — FENTANYL CITRATE (PF) 100 MCG/2ML IJ SOLN
INTRAMUSCULAR | Status: DC | PRN
  Administered 2017-02-26 (×2): 50 ug via INTRAVENOUS

## 2017-02-26 MED ORDER — ONDANSETRON HCL 4 MG/2ML IJ SOLN
INTRAMUSCULAR | Status: DC | PRN
  Administered 2017-02-26: 4 mg via INTRAVENOUS

## 2017-02-26 MED ORDER — SODIUM CHLORIDE 0.9 % IV SOLN: INTRAVENOUS | Status: DC

## 2017-02-26 MED ORDER — IOPAMIDOL (ISOVUE-300) INJECTION 61%
INTRAVENOUS | Status: AC
  Filled 2017-02-26: qty 50

## 2017-02-26 MED ORDER — ROCURONIUM BROMIDE 100 MG/10ML IV SOLN
INTRAVENOUS | Status: DC | PRN
  Administered 2017-02-26: 10 mg via INTRAVENOUS
  Administered 2017-02-26: 40 mg via INTRAVENOUS

## 2017-02-26 MED ORDER — IOPAMIDOL (ISOVUE-300) INJECTION 61%
INTRAVENOUS | Status: DC | PRN
  Administered 2017-02-26: 60 mL

## 2017-02-26 MED ORDER — GLYCOPYRROLATE 0.2 MG/ML IV SOSY: PREFILLED_SYRINGE | INTRAVENOUS | Status: DC | PRN

## 2017-02-26 NOTE — Anesthesia Postprocedure Evaluation (Signed)
Anesthesia Post Note  Patient: Mady GemmaEugene P Mroczkowski  Procedure(s) Performed: ENDOSCOPIC RETROGRADE CHOLANGIOPANCREATOGRAPHY (ERCP) (N/A )     Patient location during evaluation: Endoscopy Anesthesia Type: General Level of consciousness: awake and alert and oriented Pain management: pain level controlled Vital Signs Assessment: post-procedure vital signs reviewed and stable Respiratory status: spontaneous breathing, nonlabored ventilation and respiratory function stable Cardiovascular status: blood pressure returned to baseline and stable Postop Assessment: no apparent nausea or vomiting Anesthetic complications: no    Last Vitals:  Vitals:   02/26/17 1204 02/26/17 1525  BP: 118/78 122/81  Pulse:  67  Resp: (!) 25 18  Temp: 36.9 C   SpO2: 92% 97%    Last Pain:  Vitals:   02/26/17 1204  TempSrc: Oral  PainSc:                  [place] A.

## 2017-02-26 NOTE — Progress Notes (Signed)
PROGRESS NOTE   Daniel Brown  ZOX:[id number]    DOB: 02/23/1928    DOA: 02/23/2017  PCP: [name], [name], MD (Inactive)   I [facility] briefly [facility]  Brief Narrative:  83 year old male with PMH of CAD, ischemic cardiomyopathy, complete heart block status post PPM, COPD, CVA, HTN, HLD, permanent A. Fib on Xarelto, seizure disorder, sustained a mechanical fall at home and was down for several hours, [facility] to [facility] with acute kidney injury due to rhabdomyolysis and dehydration, UTI, abnormal LFTs and an incidental CBD stones on CT abdomen. [facility].   Assessment & Plan:   Principal Problem:   Choledocholithiasis Active Problems:   Atrial fibrillation (HCC)   ARF (acute renal failure) (HCC)   UTI (urinary tract infection)   Elevated troponin   Pressure injury of skin   Acute kidney injury - Due to rhabdomyolysis and dehydration. Improving with IVF, continue. Follow BMP in a.m. Renal ultrasound without acute abnormality.Creatinine down to 1.32.  Rhabdomyolysis - Sustained following fall at home and was down for several hours. CK on admission >14,000. Treating with IV fluids and CK was down to 7400 yesterday. CK continues to gradually improve, 1522 today.  Choledocholithiasis - Patient is status post cholecystectomy. No GI symptoms now or earlier. GI input appreciated. Agree that his abnormal LFTs are likely due to rhabdomyolysis rather than gallstones. However due to CT findings, patient underwent ERCP, sphincterotomy, CBD stone extraction and lithotripsy on 10/18. Follow LFTs in a.m. and expect improvement.  Elevated troponin Likely due to significant rhabdomyolysis and acute kidney injury. No chest pain reported.  UTI, suspected Remains on IV Zosyn,, day 4 today and consider stopping 10/19.  Permanent A. fib Rate controlled on carvedilol. Xarelto on hold and as per GI, resume 2 days  after ERCP if no bleeding complications from procedure.   Essential hypertension Controlled. Continue Imdur and carvedilol. Holding lisinopril due to acute kidney injury.  Hyperlipidemia Continues Zetia  Seizure disorder Continue Keppra. Denies recent seizures.  Thrombocytopenia Likely related to rhabdomyolysis. Follow CBCs. Improving.  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 PT evaluation.  DVT prophylaxis: SCD's Code Status: DO NOT RESUSCITATE Family Communication: None at bedside Disposition: DC home when medically improved, possibly in 48 hours.   [facility]   Procedures:  None  Antimicrobials:  IV Zosyn    Subjective: Patient was seen this morning prior to ERCP. Right lower extremity pain improved. Denies abdominal pain, nausea or vomiting.  ROS: As above  Objective:  Vitals:   02/26/17 1525 02/26/17 1532 02/26/17 1534 02/26/17 1540  BP: 122/81 125/73 128/76   Pulse: 67 66 65 61  Resp: 18 17 18 15   Temp:      TempSrc:      SpO2: 97% 99% 97% 97%  Weight:      Height:        Examination:  General exam: Elderly male, moderately built and nourished lying comfortably propped up in bed.Stable. Respiratory system: Clear to auscultation. Respiratory effort normal. Stable. Cardiovascular system: S1 & S2 heard, irregularly irregular. No JVD, murmurs, rubs, gallops or clicks. No pedal edema.  Gastrointestinal system: Abdomen is nondistended, soft and nontender. No organomegaly or masses felt. Normal bowel sounds heard. Stable. Central nervous system: Alert and oriented. No focal neurological deficits. Stable. Extremities:  Symmetric 5 x 5 power. Right leg with 2 superficial cuts with dressing clean and dry. Trace right lower extremity edema and mild right knee swelling without acute findings. Slightly painful range  of movements. Improving. Skin: No rashes, lesions or ulcers Psychiatry: Judgement and insight appear normal. Mood & affect appropriate.     Data Reviewed: I have personally reviewed following labs and imaging studies  CBC:  Recent Labs Lab 02/23/17 1920 02/24/17 0747 02/25/17 0950 02/26/17 0502  WBC 16.7* 15.3* 13.1* 8.9  NEUTROABS 14.8*  --  11.0*  --   HGB 16.7 16.2 16.6 14.7  HCT 48.8 46.8 48.5 44.6  MCV 94.0 91.9 93.3 92.7  PLT 155 136* 124* 141*   Basic Metabolic Panel:  Recent Labs Lab 02/23/17 1920 02/24/17 0747 02/25/17 0950 02/26/17 0502  NA 137 138 141 136  K 4.5 4.0 4.5 3.8  CL 101 105 112* 107  CO2 20* 19* 17* 19*  GLUCOSE 133* 100* 84 103*  BUN 43* 44* 35* 28*  CREATININE 2.41* 2.03* 1.56* 1.32*  CALCIUM 9.3 8.6* 8.8* 8.2*   Liver Function Tests:  Recent Labs Lab 02/23/17 1920 02/24/17 0747 02/25/17 0950 02/26/17 0502  AST 517* 361* 283* 249*  ALT 326* 268* 292* 289*  ALKPHOS 276* 203* 184* 154*  BILITOT 3.2* 2.6* 3.0* 2.4*  PROT 8.1 6.4* 6.4* 5.7*  ALBUMIN 3.9 3.1* 3.0* 2.7*   Coagulation Profile:  Recent Labs Lab 02/23/17 1920 02/24/17 0747 02/25/17 0950  INR 4.19* 3.40 1.77   Cardiac Enzymes:  Recent Labs Lab 02/23/17 1920 02/24/17 0747 02/25/17 1942 02/26/17 0502  CKTOTAL 14,105* 7,397*  7,397* 2,041* 1,522*  CKMB  --  133.5*  --   --   TROPONINI 0.49* 0.28*  --   --         Radiology Studies: Dg Ercp  Result Date: 02/26/2017 CLINICAL DATA:  Choledocholithiasis EXAM: ERCP TECHNIQUE: Multiple spot images obtained with the fluoroscopic device and submitted for interpretation post-procedure. COMPARISON:  CT 02/23/2017 FINDINGS: A series of fluoroscopic spot images document endoscopic cannulation and opacification of the CBD. Multiple filling defects consistent with choledocholithiasis. Subsequent images document passage of a balloon tipped catheter through the CBD. There is limited opacification of the intrahepatic  biliary tree, which appears mildly distended centrally. IMPRESSION: 1. Choledocholithiasis with endoscopic intervention. These images were submitted for radiologic interpretation only. Please see the procedural report for the amount of contrast and the fluoroscopy time utilized. Electronically Signed   By: Corlis Leak M.D.   On: 02/26/2017 15:15        Scheduled Meds: . carvedilol  3.125 mg Oral BID WC  . ezetimibe  10 mg Oral Daily  . isosorbide mononitrate  30 mg Oral Daily  . levETIRAcetam  250 mg Oral BID  . mouth rinse  15 mL Mouth Rinse BID   Continuous Infusions: . piperacillin-tazobactam (ZOSYN)  IV Stopped (02/26/17 1246)     LOS: 3 days     Shevelle Smither, MD, FACP, FHM. [facility] Pager [phone]  If 7PM-7AM, please contact night-coverage [email or web address] Password [email or web address] 02/26/2017, 4:53 PM

## 2017-02-26 NOTE — Progress Notes (Signed)
PT Cancellation Note  Patient Details Name: Mady Gemmaugene P Lumb MRN: [id number] DOB: Sep 12, 1927   Cancelled Treatment:    Reason Eval/Treat Not Completed: Patient at procedure or test/unavailable. Will follow-up for PT evaluation as time allows.  Ina HomesJaclyn Loretha Ure, PT, DPT Acute Rehab Services  Pager: [phone]  Malachy ChamberJaclyn L Nicolaos Mitrano 02/26/2017, 1:04 PM

## 2017-02-26 NOTE — Anesthesia Preprocedure Evaluation (Signed)
Anesthesia Evaluation  Patient identified by MRN, date of birth, ID band Patient awake    Reviewed: Allergy & Precautions, NPO status , Patient's Chart, lab work & pertinent test results, reviewed documented beta blocker date and time   Airway Mallampati: III       Dental  (+) Upper Dentures, Edentulous Lower   Pulmonary COPD,  COPD inhaler, former smoker,    Pulmonary exam normal breath sounds clear to auscultation       Cardiovascular hypertension, Pt. on medications and Pt. on home beta blockers + CAD and +CHF  + dysrhythmias Atrial Fibrillation + pacemaker  Rhythm:Regular Rate:Normal + Systolic murmurs    Neuro/Psych Seizures -, Well Controlled,  Right sided weakness  Neuromuscular disease CVA, Residual Symptoms negative psych ROS   GI/Hepatic Elevated LFT's Choledocholithiasis   Endo/Other  negative endocrine ROS  Renal/GU ARFRenal disease  negative genitourinary   Musculoskeletal  (+) Arthritis ,   Abdominal   Peds  Hematology Thrombocytopenia-mild   Anesthesia Other Findings   Reproductive/Obstetrics                             Anesthesia Physical Anesthesia Plan  ASA: III  Anesthesia Plan: General   Post-op Pain Management:    Induction: Intravenous  PONV Risk Score and Plan: 3 and Ondansetron, Dexamethasone and Propofol infusion  Airway Management Planned: Oral ETT  Additional Equipment:   Intra-op Plan:   Post-operative Plan: Extubation in OR  Informed Consent: I have reviewed the patients History and Physical, chart, labs and discussed the procedure including the risks, benefits and alternatives for the proposed anesthesia with the patient or authorized representative who has indicated his/her understanding and acceptance.     Plan Discussed with: CRNA, Anesthesiologist and Surgeon  Anesthesia Plan Comments:         Anesthesia Quick Evaluation

## 2017-02-26 NOTE — Op Note (Signed)
[facility] Patient Name: Daniel Brown Procedure Date : 02/26/2017 MRN: [id number] Attending MD: Vida RiggerMarc Alejos Reinhardt , MD Date of Birth: 05/15/1927 CSN: [id number] Age: 83 Admit Type: Inpatient Procedure:                ERCP Indications:              Bile duct stone(s) on CT scan and elevated liver                            tests Providers:                Vida RiggerMarc Adriyanna Christians, MD, Dwain SarnaPatricia Ford, RN, Arlee Muslimhris Chandler                            Tech., Technician Referring MD:              Medicines:                General Anesthesia Complications:            No immediate complications. Estimated Blood Loss:     Estimated blood loss: none. Procedure:                Pre-Anesthesia Assessment:                           - Prior to the procedure, a History and Physical                            was performed, and patient medications and                            allergies were reviewed. The patient's tolerance of                            previous anesthesia was also reviewed. The risks                            and benefits of the procedure and the sedation                            options and risks were discussed with the patient.                            All questions were answered, and informed consent                            was obtained. Prior Anticoagulants: The patient has                            taken Xarelto (rivaroxaban), last dose was 4 days                            prior to procedure. ASA Grade Assessment: III - A  patient with severe systemic disease. After                            reviewing the risks and benefits, the patient was                            deemed in satisfactory condition to undergo the                            procedure.                           After obtaining informed consent, the scope was                            passed under direct vision. Throughout the                            procedure, the patient's  blood pressure, pulse, and                            oxygen saturations were monitored continuously. The                            [id number]) scope was introduced through                            the mouth, and used to inject contrast into and                            used to locate the major papilla. The ERCP was                            somewhat difficult due to Multiple large stones.                            The patient tolerated the procedure well. Scope In: Scope Out: Findings:      The major papilla was some distance away from a diverticulum. The major       papilla was bulging. the wire was advanced towards the pancreas 1 time       but no dye was injected and the sphincterotome was repositioned and deep       selective cannulation was obtainedand multiple large stones were       seenthroughout the main CBD Biliary sphincterotomy was made with a       Hydratome sphincterotome using ERBE electrocautery. There was no       post-sphincterotomy bleeding. Choledocholithiasis was found in a dilated       duct. To discover objects, the biliary tree was swept with a 12- 15 mm       adjustable balloon starting at the upper third of the main bile ductas       well as sweeping the distal upper right and left ducts as well. Sludge       was swept from the duct as well. All stones were removed. Nothing  was       foundat the end of the procedure on multiple occlusion cholangiograms.       Lithotripsy with a basket-type device 3 cm was successful ?2 for the       larger stones. we did sweep the duct multiple times using both balloons       sizes.there was adequate biliary drainage and no obvious residual       stonesor debris Impression:               - The major papilla was some distance away from a                            diverticulum.                           - The major papilla appeared to be bulging.                           - Choledocholithiasis was found. Complete  removal                            was accomplished by biliary sphincterotomy and                            balloon extraction.                           - A biliary sphincterotomy was performed.                           - The biliary tree was swept and nothing was found.                           - Lithotripsy was successful. Moderate Sedation:      moderate sedation-none Recommendation:           - Clear liquid diet today. may advance tomorrow if                            doing okay                           - Resume Xarelto (rivaroxaban) at prior dose in 2                            days if no delayed complications or signs of                            bleeding.                           - Return to GI clinic PRN.                           - Telephone GI clinic if symptomatic PRN. Procedure Code(s):        --- Professional ---  16109, Esophagogastroduodenoscopy, flexible,                            transoral; diagnostic, including collection of                            specimen(s) by brushing or washing, when performed                            (separate procedure) Diagnosis Code(s):        --- Professional ---                           K83.9, Disease of biliary tract, unspecified                           K80.50, Calculus of bile duct without cholangitis                            or cholecystitis without obstruction CPT copyright 2016 American [employer] Association. All rights reserved. The codes documented in this report are preliminary and upon coder review may  be revised to meet current compliance requirements. Vida Rigger, MD 02/26/2017 3:37:07 PM This report has been signed electronically. Number of Addenda: 0

## 2017-02-26 NOTE — Anesthesia Procedure Notes (Signed)
Procedure Name: Intubation Date/Time: 02/26/2017 1:52 PM Performed by: Julian ReilWELTY, Skylar Priest F Pre-anesthesia Checklist: Patient identified, Emergency Drugs available, Suction available, Patient being monitored and Timeout performed Patient Re-evaluated:Patient Re-evaluated prior to induction Oxygen Delivery Method: Circle system utilized Preoxygenation: Pre-oxygenation with 100% oxygen Induction Type: IV induction Ventilation: Mask ventilation without difficulty and Oral airway inserted - appropriate to patient size Laryngoscope Size: Miller and 3 Grade View: Grade I Tube type: Oral Tube size: 7.5 mm Number of attempts: 1 Airway Equipment and Method: Stylet Placement Confirmation: ETT inserted through vocal cords under direct vision,  positive ETCO2 and breath sounds checked- equal and bilateral Secured at: 22 cm Tube secured with: Tape Dental Injury: Teeth and Oropharynx as per pre-operative assessment

## 2017-02-26 NOTE — Progress Notes (Signed)
Daniel Brown 1:00 PM  Subjective: Patient with CBD stones on CT scan as well as elevated liver tests but actually doing quite well and he looks better than his story sounds and his case discussed with his son as well and his hospital computer chart reviewed and case discussed with my partner Dr. Bosie ClosSchooler  Objective: Vital signs stable afebrile no acute distress exam please see preassessment evaluation labs and CT reviewed  Assessment: CBD stones  Plan: The risks benefits methods and success rate including possible stenting was discussed with the patient and his son and will proceed with anesthesia assistance with further workup and plans pending those findings  [facility] HospitalMAG[facility],Daniel Brown  Pager [phone] After 5PM or if no answer call [phone]

## 2017-02-26 NOTE — Transfer of Care (Signed)
Immediate Anesthesia Transfer of Care Note  Patient: Daniel Brown  Procedure(s) Performed: ENDOSCOPIC RETROGRADE CHOLANGIOPANCREATOGRAPHY (ERCP) (N/A )  Patient Location: PACU  Anesthesia Type:General  Level of Consciousness: oriented  Airway & Oxygen Therapy: Patient Spontanous Breathing and Patient connected to nasal cannula oxygen  Post-op Assessment: Report given to RN and Post -op Vital signs reviewed and stable  Post vital signs: Reviewed and stable  Last Vitals:  Vitals:   02/26/17 1000 02/26/17 1204  BP: 108/66 118/78  Pulse: 66   Resp: 16 (!) 25  Temp: (!) 36.4 C 36.9 C  SpO2: 96% 92%    Last Pain:  Vitals:   02/26/17 1204  TempSrc: Oral  PainSc:       Patients Stated Pain Goal: 0 (02/26/17 1000)  Complications: No apparent anesthesia complications

## 2017-02-27 ENCOUNTER — Encounter (HOSPITAL_COMMUNITY): Payer: Self-pay | Admitting: Gastroenterology

## 2017-02-27 LAB — CBC WITH DIFFERENTIAL/PLATELET
BASOS ABS: 0 10*3/uL (ref 0.0–0.1)
Basophils Relative: 0 %
Eosinophils Absolute: 0.1 10*3/uL (ref 0.0–0.7)
Eosinophils Relative: 1 %
HEMATOCRIT: 43.8 % (ref 39.0–52.0)
HEMOGLOBIN: 14.2 g/dL (ref 13.0–17.0)
LYMPHS ABS: 0.9 10*3/uL (ref 0.7–4.0)
LYMPHS PCT: 11 %
MCH: 30.5 pg (ref 26.0–34.0)
MCHC: 32.4 g/dL (ref 30.0–36.0)
MCV: 94 fL (ref 78.0–100.0)
Monocytes Absolute: 1.1 10*3/uL — ABNORMAL HIGH (ref 0.1–1.0)
Monocytes Relative: 13 %
NEUTROS ABS: 6.3 10*3/uL (ref 1.7–7.7)
Neutrophils Relative %: 75 %
Platelets: 127 10*3/uL — ABNORMAL LOW (ref 150–400)
RBC: 4.66 MIL/uL (ref 4.22–5.81)
RDW: 14.7 % (ref 11.5–15.5)
WBC: 8.3 10*3/uL (ref 4.0–10.5)

## 2017-02-27 LAB — COMPREHENSIVE METABOLIC PANEL
ALK PHOS: 118 U/L (ref 38–126)
ALT: 294 U/L — AB (ref 17–63)
ANION GAP: 8 (ref 5–15)
AST: 204 U/L — ABNORMAL HIGH (ref 15–41)
Albumin: 2.4 g/dL — ABNORMAL LOW (ref 3.5–5.0)
BILIRUBIN TOTAL: 1.8 mg/dL — AB (ref 0.3–1.2)
BUN: 28 mg/dL — ABNORMAL HIGH (ref 6–20)
CHLORIDE: 107 mmol/L (ref 101–111)
CO2: 23 mmol/L (ref 22–32)
Calcium: 8.3 mg/dL — ABNORMAL LOW (ref 8.9–10.3)
Creatinine, Ser: 1.6 mg/dL — ABNORMAL HIGH (ref 0.61–1.24)
GFR calc Af Amer: 43 mL/min — ABNORMAL LOW (ref >60)
GFR, EST NON AFRICAN AMERICAN: 37 mL/min — AB (ref >60)
Glucose, Bld: 115 mg/dL — ABNORMAL HIGH (ref 65–99)
POTASSIUM: 4.1 mmol/L (ref 3.5–5.1)
SODIUM: 138 mmol/L (ref 135–145)
TOTAL PROTEIN: 5.6 g/dL — AB (ref 6.5–8.1)

## 2017-02-27 LAB — CK: Total CK: 568 U/L — ABNORMAL HIGH (ref 49–397)

## 2017-02-27 MED ORDER — BISACODYL 10 MG RE SUPP
10.0000 mg | Freq: Once | RECTAL | Status: AC
  Administered 2017-02-27: 10 mg via RECTAL
  Filled 2017-02-27: qty 1

## 2017-02-27 MED ORDER — SENNA 8.6 MG PO TABS
2.0000 | ORAL_TABLET | Freq: Every day | ORAL | Status: DC
  Administered 2017-02-27 – 2017-03-03 (×3): 17.2 mg via ORAL
  Filled 2017-02-27 (×4): qty 2

## 2017-02-27 MED ORDER — SODIUM CHLORIDE 0.9 % IV SOLN
INTRAVENOUS | Status: AC
  Administered 2017-02-27: 19:00:00 via INTRAVENOUS

## 2017-02-27 MED ORDER — POLYETHYLENE GLYCOL 3350 17 G PO PACK
17.0000 g | PACK | Freq: Two times a day (BID) | ORAL | Status: DC
  Administered 2017-02-28 – 2017-03-03 (×5): 17 g via ORAL
  Filled 2017-02-27 (×6): qty 1

## 2017-02-27 NOTE — Consult Note (Signed)
WOC Nurse wound consult note Reason for Consult: Consult requested for sacrum.  Pt has pink dry scar tissue from a previous wound which has healed, he states.  There is a partial thickness fissure in the upper gluteal fold; .8X.1X.1cm, pink and moist Pressure Injury POA: These areas were present on admission but are NOT pressure injuries Dressing procedure/placement/frequency: Scar tissue is high risk for breakdown; foam dressing in place to protect from friction/shear and absorb moisture.  Discussed plan of care with patient and he verbalized understanding. Please re-consult if further assistance is needed.  Thank-you,  Cammie Mcgeeawn Everhett Bozard MSN, RN, CWOCN, [place]-AP, CNS [phone]

## 2017-02-27 NOTE — Progress Notes (Signed)
Daniel Brown  Daniel Brown 83 y.o. 11/15/1927   Subjective: Feels great. Denies abdominal pain. Tolerating soft diet. Happy that he can lift his leg off the bed.  Objective: Vital signs: Vitals:   02/26/17 2051 02/27/17 0525  BP: (!) 103/54 116/74  Pulse: 72 66  Resp: 18 16  Temp: 98.6 F (37 Brown) 98.5 F (36.9 Brown)  SpO2: 96% 93%    Physical Exam: Gen: alert, no acute distress, elderly HEENT: anicteric sclera CV: RRR Chest: CTA B Abd: soft, nontender, nondistended, +BS Ext: no edema  Lab Results:  Recent Labs  02/26/17 0502 02/27/17 0502  NA 136 138  K 3.8 4.1  CL 107 107  CO2 19* 23  GLUCOSE 103* 115*  BUN 28* 28*  CREATININE 1.32* 1.60*  CALCIUM 8.2* 8.3*    Recent Labs  02/26/17 0502 02/27/17 0502  AST 249* 204*  ALT 289* 294*  ALKPHOS 154* 118  BILITOT 2.4* 1.8*  PROT 5.7* 5.6*  ALBUMIN 2.7* 2.4*    Recent Labs  02/25/17 0950 02/26/17 0502 02/27/17 0502  WBC 13.1* 8.9 8.3  NEUTROABS 11.0*  --  6.3  HGB 16.6 14.7 14.2  HCT 48.5 44.6 43.8  MCV 93.3 92.7 94.0  PLT 124* 141* 127*      Assessment/Plan: S/P ERCP yesterday with large common bile duct stones removed and sphincterotomy. Liver enzymes improving. Doing well post-ERCP. Tolerating soft diet. Ok to be d/Brown from a GI standpoint tomorrow. F/U prn. Will sign off. Call if questions. Dr. Matthias Brown on call tomorrow if needed.   Daniel Brown. 02/27/2017, 12:06 PM  AFTER 5 PM or on weekends please call [phone] ID: Daniel Brown, male   DOB: 02/12/1928, 83 y.o.   MRN: [id number]

## 2017-02-27 NOTE — Progress Notes (Signed)
PROGRESS NOTE   KASCH BORQUEZ  ZOX:[id number]    DOB: 1927/09/26    DOA: 02/23/2017  PCP: [name], MD (Inactive)   I have briefly reviewed patients previous medical records in [facility].  Brief Narrative:  83 year old male with PMH of CAD, ischemic cardiomyopathy, complete heart block status post PPM, COPD, CVA, HTN, HLD, permanent A. Fib on Xarelto, seizure disorder, sustained a mechanical fall at home and was down for several hours, presented to [facility] initially with acute kidney injury due to rhabdomyolysis and dehydration, UTI, abnormal LFTs and an incidental CBD stones on CT abdomen. Transferred to [facility] for GI evaluation.   Assessment & Plan:   Principal Problem:   Choledocholithiasis Active Problems:   Atrial fibrillation (HCC)   ARF (acute renal failure) (HCC)   UTI (urinary tract infection)   Elevated troponin   Pressure injury of skin   Acute kidney injury - Due to rhabdomyolysis and dehydration. Improving with IVF, continue. Follow BMP in a.m. Renal ultrasound without acute abnormality.Creatinine had trended down to 1.32 on 10/18 but has crept up to 1.6 today. Continue IV fluids and follow BMP in a.m.  Rhabdomyolysis - Sustained following fall at home and was down for several hours. CK on admission >14,000. Treating with IV fluids and CK was down to 7400 yesterday. CK continues to gradually improve, 568 today.  Choledocholithiasis - Patient is status post cholecystectomy. No GI symptoms now or earlier. GI input appreciated. Agree that his abnormal LFTs are likely due to rhabdomyolysis rather than gallstones. However due to CT findings, patient underwent ERCP, sphincterotomy, large CBD stone extraction and lithotripsy on 10/18. Follow LFTs in a.m. and expect improvement. GI signed off and recommend follow-up when necessary.  Elevated troponin Likely due to significant rhabdomyolysis and acute kidney injury. No chest pain reported.  UTI, suspected Remains on  IV Zosyn,, day 4 today and discontinued 10/19.  Permanent A. fib Rate controlled on carvedilol. Xarelto on hold and as per GI, resume 2 days after ERCP if no bleeding complications from procedure. Consider resuming Xarelto 10/20  Essential hypertension Controlled. Continue Imdur and carvedilol. Holding lisinopril due to acute kidney injury.  Hyperlipidemia Continues Zetia  Seizure disorder Continue Keppra. Denies recent seizures.  Thrombocytopenia Likely related to rhabdomyolysis. Follow CBCs. Improving.  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 PT evaluation >recommend SNF..  DVT prophylaxis: SCD's Code Status: DO NOT RESUSCITATE Family Communication: None at bedside Disposition: DC home when medically improved, possibly in 24 hours, pending SNF bed availability..   Consultants:  [facility]   Procedures:  None  Antimicrobials:  IV Zosyn    Subjective: Feels much better. Improved pain and right lower extremity and able to left it off the bed. Tolerated soft diet without nausea or vomiting. Constipation. No abdominal pain.  ROS: As above  Objective:  Vitals:   02/26/17 1540 02/26/17 2051 02/27/17 0525 02/27/17 1430  BP:  (!) 103/54 116/74 113/73  Pulse: 61 72 66 66  Resp: 15 18 16 18   Temp:  98.6 F (37 C) 98.5 F (36.9 C) (!) 97.5 F (36.4 C)  TempSrc:  Oral Oral Oral  SpO2: 97% 96% 93% 96%  Weight:   78 kg (172 lb)   Height:        Examination:  General exam: Elderly male, moderately built and nourished lying comfortably propped up in  bed.Stable. Respiratory system: Clear to auscultation. Respiratory effort normal. Stable. Cardiovascular system: S1 & S2 heard, irregularly irregular. No JVD, murmurs, rubs, gallops or clicks. No pedal edema.  Gastrointestinal system: Abdomen is nondistended, soft and nontender. No  organomegaly or masses felt. Normal bowel sounds heard. Stable. Central nervous system: Alert and oriented. No focal neurological deficits. Stable. Extremities: Symmetric 5 x 5 power. Right leg with 2 superficial cuts with dressing clean and dry. Trace right lower extremity edema and mild right knee swelling without acute findings. Doing much better. Able to lift off right lower extremity off the bed without much discomfort. Skin: No rashes, lesions or ulcers Psychiatry: Judgement and insight appear normal. Mood & affect appropriate.     Data Reviewed: I have personally reviewed following labs and imaging studies  CBC:  Recent Labs Lab 02/23/17 1920 02/24/17 0747 02/25/17 0950 02/26/17 0502 02/27/17 0502  WBC 16.7* 15.3* 13.1* 8.9 8.3  NEUTROABS 14.8*  --  11.0*  --  6.3  HGB 16.7 16.2 16.6 14.7 14.2  HCT 48.8 46.8 48.5 44.6 43.8  MCV 94.0 91.9 93.3 92.7 94.0  PLT 155 136* 124* 141* 127*   Basic Metabolic Panel:  Recent Labs Lab 02/23/17 1920 02/24/17 0747 02/25/17 0950 02/26/17 0502 02/27/17 0502  NA 137 138 141 136 138  K 4.5 4.0 4.5 3.8 4.1  CL 101 105 112* 107 107  CO2 20* 19* 17* 19* 23  GLUCOSE 133* 100* 84 103* 115*  BUN 43* 44* 35* 28* 28*  CREATININE 2.41* 2.03* 1.56* 1.32* 1.60*  CALCIUM 9.3 8.6* 8.8* 8.2* 8.3*   Liver Function Tests:  Recent Labs Lab 02/23/17 1920 02/24/17 0747 02/25/17 0950 02/26/17 0502 02/27/17 0502  AST 517* 361* 283* 249* 204*  ALT 326* 268* 292* 289* 294*  ALKPHOS 276* 203* 184* 154* 118  BILITOT 3.2* 2.6* 3.0* 2.4* 1.8*  PROT 8.1 6.4* 6.4* 5.7* 5.6*  ALBUMIN 3.9 3.1* 3.0* 2.7* 2.4*   Coagulation Profile:  Recent Labs Lab 02/23/17 1920 02/24/17 0747 02/25/17 0950  INR 4.19* 3.40 1.77   Cardiac Enzymes:  Recent Labs Lab 02/23/17 1920 02/24/17 0747 02/25/17 1942 02/26/17 0502 02/27/17 0502  CKTOTAL 14,105* 7,397*  7,397* 2,041* 1,522* 568*  CKMB  --  133.5*  --   --   --   TROPONINI 0.49* 0.28*  --   --    --         Radiology Studies: Dg Ercp  Result Date: 02/26/2017 CLINICAL DATA:  Choledocholithiasis EXAM: ERCP TECHNIQUE: Multiple spot images obtained with the fluoroscopic device and submitted for interpretation post-procedure. COMPARISON:  CT 02/23/2017 FINDINGS: A series of fluoroscopic spot images document endoscopic cannulation and opacification of the CBD. Multiple filling defects consistent with choledocholithiasis. Subsequent images document passage of a balloon tipped catheter through the CBD. There is limited opacification of the intrahepatic biliary tree, which appears mildly distended centrally. IMPRESSION: 1. Choledocholithiasis with endoscopic intervention. These images were submitted for radiologic interpretation only. Please see the procedural report for the amount of contrast and the fluoroscopy time utilized. Electronically Signed   By: Corlis Leak  Hassell M.D.   On: 02/26/2017 15:15        Scheduled Meds: . carvedilol  3.125 mg Oral BID WC  . ezetimibe  10 mg Oral Daily  . isosorbide mononitrate  30 mg Oral Daily  . levETIRAcetam  250 mg Oral BID  . mouth rinse  15 mL Mouth Rinse BID   Continuous Infusions: . piperacillin-tazobactam (ZOSYN)  IV 3.375  g (02/27/17 1654)     LOS: 4 days     Bradie Sangiovanni, MD, FACP, FHM. [facility] Pager [phone]  If 7PM-7AM, please contact night-coverage [email or web address] Password [email or web address] 02/27/2017, 5:35 PM

## 2017-02-27 NOTE — Evaluation (Signed)
Physical Therapy Evaluation Patient Details Name: Daniel Brown MRN: [id number] DOB: 10/18/1927 Today's Date: 02/27/2017   History of Present Illness  Pt is a 83 y.o. male admitted on 02/23/17 having sustained a mechanical fall at home and was down for several hours, presented to [facility] initially with acute kidney injury due to rhabdomyolysis and dehydration, UTI, abnormal LFTs and an incidental CBD stones on CT abdomen. Transferred to [facility] for GI evaluation. Pt with CBD stones on CT scan; now s/p endoscopic retrograde cholangiopancreatography. Pertinent PMH includes CAD, ischemic cardiomyopathy, complete heart block status post PPM, COPD, CVA, HTN, HLD, permanent A. Fib on Xarelto, seizure disorder.   Clinical Impression  Pt presents with generalized weakness and an overall decrease in functional mobility secondary to above. PTA, pt mod indep for ADLs and amb with SPC; family lives nearby, providing assist and driving pt as needed. Today, pt able to able to amb to/from bathroom with RW and min-modA (+2 safety); intermittent bouts of instability requiring multimodal cues to correct. Discussed need for continued rehab at SNF prior to d/c home with pt as he is at significant risk for falling; pt in agreement, but would like to discuss this with his son. Pt would benefit from continued acute PT services to maximize functional mobility and independence.     Follow Up Recommendations SNF;Supervision for mobility/OOB    Equipment Recommendations  Other (comment) (defer to next venue)    Recommendations for Other Services       Precautions / Restrictions Precautions Precautions: Fall Restrictions Weight Bearing Restrictions: No      Mobility  Bed Mobility Overal bed mobility: Needs Assistance Bed Mobility: Supine to Sit     Supine to sit: Mod assist;HOB elevated     General bed mobility comments: ModA to assist trunk into elevation; cues for sequencing of BLEs off bed  Transfers Overall  transfer level: Needs assistance Equipment used: Rolling walker (2 wheeled) Transfers: Sit to/from Stand Sit to Stand: Mod assist;Min assist         General transfer comment: Pt able to stand from bed on 3rd attempt with RW and modA+2 to assist trunk into elevation. Stood from toilet with minA and BUE support on rails.   Ambulation/Gait Ambulation/Gait assistance: Mod assist;+2 safety/equipment;Min assist Ambulation Distance (Feet): 20 Feet Assistive device: Rolling walker (2 wheeled) Gait Pattern/deviations: Step-through pattern;Decreased stride length;Antalgic;Trunk flexed Gait velocity: Decreased Gait velocity interpretation: <1.8 ft/sec, indicative of risk for recurrent falls General Gait Details: Amb to bathroom with RW and modA (+2 safety) for balance; pt with intermittent forward sway, requiring cues to extend trunk/hips in order to maintain upright balance. Amb from bathroom to chair with RW and minA(+2 safety). Further mobility limited secondary to fatigue   Stairs            Wheelchair Mobility    Modified Rankin (Stroke Patients Only)       Balance Overall balance assessment: Needs assistance Sitting-balance support: No upper extremity supported;Feet supported Sitting balance-Leahy Scale: Fair Sitting balance - Comments: Initially requiring modA for seated balance; intermittent bouts of sitting with min guard with multimodal cues for leaning forward   Standing balance support: During functional activity;Bilateral upper extremity supported Standing balance-Leahy Scale: Poor Standing balance comment: Reliant on UE support                             Pertinent Vitals/Pain Pain Assessment: No/denies pain    Home Living Family/patient expects  to be discharged to:: Private residence Living Arrangements: Alone Available Help at Discharge: Family;Available PRN/intermittently Type of Home: House Home Access: Stairs to enter Entrance Stairs-Rails:  Doctor, general practiceight;Left Entrance Stairs-Number of Steps: 3 Home Layout: One level Home Equipment: Cane - single point;Other (comment) Customer service manager(Lift chair recliner) Additional Comments: Pt reports son lives [place]" and checks on him regularly; son works full-time    Prior Function Level of Independence: Independent with assistive device(s)         Comments: Pt reports mod indep with SPC; indep with all ADLs and household activities. Does not drive     Hand Dominance        Extremity/Trunk Assessment   Upper Extremity Assessment Upper Extremity Assessment: Generalized weakness    Lower Extremity Assessment Lower Extremity Assessment: Generalized weakness    Cervical / Trunk Assessment Cervical / Trunk Assessment: Kyphotic  Communication   Communication: HOH  Cognition Arousal/Alertness: Awake/alert Behavior During Therapy: WFL for tasks assessed/performed Overall Cognitive Status: Within Functional Limits for tasks assessed                                        General Comments      Exercises     Assessment/Plan    PT Assessment Patient needs continued PT services  PT Problem List Decreased strength;Decreased activity tolerance;Decreased balance;Decreased mobility;Decreased knowledge of use of DME       PT Treatment Interventions DME instruction;Gait training;Stair training;Therapeutic activities;Therapeutic exercise;Functional mobility training;Balance training;Patient/family education    PT Goals (Current goals can be found in the Care Plan section)  Acute Rehab PT Goals Patient Stated Goal: Return home PT Goal Formulation: With patient Time For Goal Achievement: 03/13/17 Potential to Achieve Goals: Good    Frequency Min 2X/week   Barriers to discharge Decreased caregiver support      Co-evaluation               AM-PAC PT "6 Clicks" Daily Activity  Outcome Measure Difficulty turning over in bed (including adjusting bedclothes, sheets and  blankets)?: Unable Difficulty moving from lying on back to sitting on the side of the bed? : Unable Difficulty sitting down on and standing up from a chair with arms (e.g., wheelchair, bedside commode, etc,.)?: Unable Help needed moving to and from a bed to chair (including a wheelchair)?: A Little Help needed walking in hospital room?: A Little Help needed climbing 3-5 steps with a railing? : A Lot 6 Click Score: 11    End of Session Equipment Utilized During Treatment: Gait belt Activity Tolerance: Patient tolerated treatment well;Patient limited by fatigue Patient left: in chair;with call bell/phone within reach;with nursing/sitter in room;with chair alarm set Nurse Communication: Mobility status PT Visit Diagnosis: Muscle weakness (generalized) (M62.81);Other abnormalities of gait and mobility (R26.89)    Time: 7829-56211142-1215 PT Time Calculation (min) (ACUTE ONLY): 33 min   Charges:   PT Evaluation $PT Eval Moderate Complexity: 1 Mod PT Treatments $Therapeutic Activity: 8-22 mins   PT G Codes:       Ina HomesJaclyn Lilah Mijangos, PT, DPT Acute Rehab Services  Pager: [phone]  Malachy ChamberJaclyn L Reshma Hoey 02/27/2017, 12:29 PM

## 2017-02-27 NOTE — Care Management Important Message (Signed)
Important Message  Patient Details  Name: Daniel Brown MRN: [id number] Date of Birth: 09/17/1927   Medicare Important Message Given:  Yes    Mattheu Brodersen 02/27/2017, 1:50 PM

## 2017-02-28 LAB — COMPREHENSIVE METABOLIC PANEL
ALT: 259 U/L — AB (ref 17–63)
AST: 154 U/L — AB (ref 15–41)
Albumin: 2.5 g/dL — ABNORMAL LOW (ref 3.5–5.0)
Alkaline Phosphatase: 117 U/L (ref 38–126)
Anion gap: 7 (ref 5–15)
BUN: 25 mg/dL — AB (ref 6–20)
CHLORIDE: 108 mmol/L (ref 101–111)
CO2: 23 mmol/L (ref 22–32)
CREATININE: 1.41 mg/dL — AB (ref 0.61–1.24)
Calcium: 8.6 mg/dL — ABNORMAL LOW (ref 8.9–10.3)
GFR calc Af Amer: 50 mL/min — ABNORMAL LOW (ref >60)
GFR, EST NON AFRICAN AMERICAN: 44 mL/min — AB (ref >60)
Glucose, Bld: 96 mg/dL (ref 65–99)
POTASSIUM: 4 mmol/L (ref 3.5–5.1)
SODIUM: 138 mmol/L (ref 135–145)
Total Bilirubin: 1.6 mg/dL — ABNORMAL HIGH (ref 0.3–1.2)
Total Protein: 5.6 g/dL — ABNORMAL LOW (ref 6.5–8.1)

## 2017-02-28 LAB — CBC
HCT: 45.7 % (ref 39.0–52.0)
Hemoglobin: 15.5 g/dL (ref 13.0–17.0)
MCH: 31.6 pg (ref 26.0–34.0)
MCHC: 33.9 g/dL (ref 30.0–36.0)
MCV: 93.3 fL (ref 78.0–100.0)
PLATELETS: 140 10*3/uL — AB (ref 150–400)
RBC: 4.9 MIL/uL (ref 4.22–5.81)
RDW: 14.5 % (ref 11.5–15.5)
WBC: 9 10*3/uL (ref 4.0–10.5)

## 2017-02-28 LAB — CK: CK TOTAL: 464 U/L — AB (ref 49–397)

## 2017-02-28 MED ORDER — SODIUM CHLORIDE 0.9 % IV SOLN
INTRAVENOUS | Status: DC
  Administered 2017-02-28: 11:00:00 via INTRAVENOUS

## 2017-02-28 MED ORDER — RIVAROXABAN 15 MG PO TABS
15.0000 mg | ORAL_TABLET | Freq: Every day | ORAL | Status: DC
  Administered 2017-02-28 – 2017-03-02 (×3): 15 mg via ORAL
  Filled 2017-02-28 (×3): qty 1

## 2017-02-28 NOTE — Progress Notes (Signed)
PROGRESS NOTE   Daniel Brown  ZOX:[id number]    DOB: 02/17/1928    DOA: 02/23/2017  PCP: [name], [name], MD (Inactive)   I have briefly reviewed patients [facility].  Brief Narrative:  83 year old male with PMH of CAD, ischemic cardiomyopathy, complete heart block status post PPM, COPD, CVA, HTN, HLD, permanent A. Fib on Xarelto, seizure disorder, sustained a mechanical fall at home and was down for several hours, [facility] with acute kidney injury due to rhabdomyolysis and dehydration, UTI, abnormal LFTs and an incidental CBD stones on CT abdomen. [facility].   Assessment & Plan:   Principal Problem:   Choledocholithiasis Active Problems:   Atrial fibrillation (HCC)   ARF (acute renal failure) (HCC)   UTI (urinary tract infection)   Elevated troponin   Pressure injury of skin   Acute kidney injury - Due to rhabdomyolysis and dehydration. Improving with IVF, continue. Follow BMP in a.m. Renal ultrasound without acute abnormality. Creatinine had peaked to 2.41. This has gradually improved to 1.41 today. Baseline creatinine may be in the 1.3-1.4 range. Continue IV fluids for additional 24 hours and then discontinue.  Rhabdomyolysis - Sustained following fall at home and was down for several hours. CK on admission >14,000. Treating with IV fluids and CK was down to 7400 yesterday. CK continues to gradually improve, 464 today.  Choledocholithiasis - Patient is status post cholecystectomy. No GI symptoms now or earlier. GI input appreciated. Agree that his abnormal LFTs are likely due to rhabdomyolysis rather than gallstones. However due to CT findings, patient underwent ERCP, sphincterotomy, large CBD stone extraction and lithotripsy on 10/18. Follow LFTs in a.m. and expect improvement. GI signed off and recommend follow-up when necessary. - LFTs continued to gradually improve.  Elevated troponin Likely due  to significant rhabdomyolysis and acute kidney injury. No chest pain reported.  UTI, suspected Remains on IV Zosyn,, day 4 today and discontinued 10/19.  Permanent A. fib Rate controlled on carvedilol. Xarelto on hold and as per GI, resume 2 days after ERCP if no bleeding complications from procedure. As recommended by GI, resumed xarelto 10/20.  Essential hypertension Controlled. Continue Imdur and carvedilol. Holding lisinopril due to acute kidney injury.  Hyperlipidemia Continues Zetia  Seizure disorder Continue Keppra. Denies recent seizures.  Thrombocytopenia Likely related to rhabdomyolysis. Follow CBCs. Improving.  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 PT evaluation >recommend SNF. Social worker consulted.  DVT prophylaxis: SCD's Code Status: DO NOT RESUSCITATE Family Communication: Discussed in detail with patient's son. Updated care and answered questions. Disposition: DC home when medically improved, possibly in 24 hours, pending SNF bed availability..   Consultants:  [facility]   Procedures:  None  Antimicrobials:  IV Zosyn    Subjective: Had small BM this morning. Tolerating diet without nausea or vomiting. Not much pain reported. Had been up in the chair for 2 hours this morning.   ROS: As above  Objective:  Vitals:   02/27/17 1430 02/27/17 2011 02/28/17 0524 02/28/17 1252  BP: 113/73 122/76 118/73 132/87  Pulse: 66 65 66 65  Resp: 18 18 17 18   Temp: (!) 97.5 F (36.4 C) 97.9 F (36.6 C) 98 F (36.7 C) 97.6 F (36.4 C)  TempSrc: Oral   Oral  SpO2: 96% 95% 96% 99%  Weight:  79.4 kg (174 lb 15.3 oz)   Height:        Examination:  General exam: Elderly male, moderately built and nourished lying comfortably propped up in bed.Stable. Respiratory system: Clear to auscultation. Respiratory effort normal.  Stable. Cardiovascular system: S1 & S2 heard, irregularly irregular. No JVD, murmurs, rubs, gallops or clicks. No pedal edema.  Gastrointestinal system: Abdomen is nondistended, soft and nontender. No organomegaly or masses felt. Normal bowel sounds heard. Stable. Central nervous system: Alert and oriented. No focal neurological deficits. Stable. Extremities: Symmetric 5 x 5 power. Right leg with 2 superficial cuts with dressing clean and dry. Trace right lower extremity edema and mild right knee swelling without acute findings. Doing much better. Able to lift off right lower extremity off the bed without much discomfort. Skin: No rashes, lesions or ulcers Psychiatry: Judgement and insight appear normal. Mood & affect appropriate.     Data Reviewed: I have personally reviewed following labs and imaging studies  CBC:  Recent Labs Lab 02/23/17 1920 02/24/17 0747 02/25/17 0950 02/26/17 0502 02/27/17 0502 02/28/17 0558  WBC 16.7* 15.3* 13.1* 8.9 8.3 9.0  NEUTROABS 14.8*  --  11.0*  --  6.3  --   HGB 16.7 16.2 16.6 14.7 14.2 15.5  HCT 48.8 46.8 48.5 44.6 43.8 45.7  MCV 94.0 91.9 93.3 92.7 94.0 93.3  PLT 155 136* 124* 141* 127* 140*   Basic Metabolic Panel:  Recent Labs Lab 02/24/17 0747 02/25/17 0950 02/26/17 0502 02/27/17 0502 02/28/17 0558  NA 138 141 136 138 138  K 4.0 4.5 3.8 4.1 4.0  CL 105 112* 107 107 108  CO2 19* 17* 19* 23 23  GLUCOSE 100* 84 103* 115* 96  BUN 44* 35* 28* 28* 25*  CREATININE 2.03* 1.56* 1.32* 1.60* 1.41*  CALCIUM 8.6* 8.8* 8.2* 8.3* 8.6*   Liver Function Tests:  Recent Labs Lab 02/24/17 0747 02/25/17 0950 02/26/17 0502 02/27/17 0502 02/28/17 0558  AST 361* 283* 249* 204* 154*  ALT 268* 292* 289* 294* 259*  ALKPHOS 203* 184* 154* 118 117  BILITOT 2.6* 3.0* 2.4* 1.8* 1.6*  PROT 6.4* 6.4* 5.7* 5.6* 5.6*  ALBUMIN 3.1* 3.0* 2.7* 2.4* 2.5*   Coagulation Profile:  Recent Labs Lab 02/23/17 1920 02/24/17 0747 02/25/17 0950  INR 4.19*  3.40 1.77   Cardiac Enzymes:  Recent Labs Lab 02/23/17 1920 02/24/17 0747 02/25/17 1942 02/26/17 0502 02/27/17 0502 02/28/17 0558  CKTOTAL 14,105* 7,397*  7,397* 2,041* 1,522* 568* 464*  CKMB  --  133.5*  --   --   --   --   TROPONINI 0.49* 0.28*  --   --   --   --         Radiology Studies: Dg Ercp  Result Date: 02/26/2017 CLINICAL DATA:  Choledocholithiasis EXAM: ERCP TECHNIQUE: Multiple spot images obtained with the fluoroscopic device and submitted for interpretation post-procedure. COMPARISON:  CT 02/23/2017 FINDINGS: A series of fluoroscopic spot images document endoscopic cannulation and opacification of the CBD. Multiple filling defects consistent with choledocholithiasis. Subsequent images document passage of a balloon tipped catheter through the CBD. There is limited opacification of the intrahepatic biliary tree, which appears mildly distended centrally. IMPRESSION: 1. Choledocholithiasis with endoscopic intervention. These images were submitted for radiologic interpretation only. Please see the procedural report for the amount of contrast and the fluoroscopy time utilized. Electronically Signed   By: Corlis Leak M.D.   On: 02/26/2017 15:15        Scheduled Meds: . carvedilol  3.125 mg Oral BID WC  . ezetimibe  10 mg Oral Daily  . isosorbide mononitrate  30 mg Oral Daily  . levETIRAcetam  250 mg Oral BID  . mouth rinse  15 mL Mouth Rinse BID  . polyethylene glycol  17 g Oral BID  . senna  2 tablet Oral Daily   Continuous Infusions: . sodium chloride 75 mL/hr at 02/28/17 1030     LOS: 5 days     Endy Easterly, MD, FACP, FHM. [facility] Pager [phone]  If 7PM-7AM, please contact night-coverage [email or web address] Password [email or web address] 02/28/2017, 2:38 PM

## 2017-03-01 LAB — COMPREHENSIVE METABOLIC PANEL
ALBUMIN: 2.7 g/dL — AB (ref 3.5–5.0)
Alkaline Phosphatase: 114 U/L (ref 38–126)
Anion gap: 9 (ref 5–15)
BUN: 23 mg/dL — AB (ref 6–20)
CHLORIDE: 110 mmol/L (ref 101–111)
CO2: 18 mmol/L — AB (ref 22–32)
CREATININE: 1.34 mg/dL — AB (ref 0.61–1.24)
Calcium: 8.8 mg/dL — ABNORMAL LOW (ref 8.9–10.3)
GFR calc Af Amer: 54 mL/min — ABNORMAL LOW (ref >60)
GFR calc non Af Amer: 46 mL/min — ABNORMAL LOW (ref >60)
Glucose, Bld: 105 mg/dL — ABNORMAL HIGH (ref 65–99)
Potassium: 3.9 mmol/L (ref 3.5–5.1)
SODIUM: 137 mmol/L (ref 135–145)
Total Protein: 6.1 g/dL — ABNORMAL LOW (ref 6.5–8.1)

## 2017-03-01 LAB — CK: Total CK: 277 U/L (ref 49–397)

## 2017-03-01 LAB — CBC
HCT: 46.5 % (ref 39.0–52.0)
HEMOGLOBIN: 15.8 g/dL (ref 13.0–17.0)
MCH: 31.8 pg (ref 26.0–34.0)
MCHC: 34 g/dL (ref 30.0–36.0)
MCV: 93.6 fL (ref 78.0–100.0)
PLATELETS: 183 10*3/uL (ref 150–400)
RBC: 4.97 MIL/uL (ref 4.22–5.81)
RDW: 14.9 % (ref 11.5–15.5)
WBC: 8.8 10*3/uL (ref 4.0–10.5)

## 2017-03-01 NOTE — Progress Notes (Signed)
PROGRESS NOTE   Daniel Brown  WUJ:[id number]    DOB: December 14, 1927    DOA: 02/23/2017  PCP: [name], MD (Inactive)   I have briefly reviewed patients previous medical records in [facility].  Brief Narrative:  83 year old male with PMH of CAD, ischemic cardiomyopathy, complete heart block status post PPM, COPD, CVA, HTN, HLD, permanent A. Fib on Xarelto, seizure disorder, sustained a mechanical fall at home and was down for several hours, presented to [facility] initially with acute kidney injury due to rhabdomyolysis and dehydration, UTI, abnormal LFTs and an incidental CBD stones on CT abdomen. Transferred to [facility] for GI evaluation.   Assessment & Plan:   Principal Problem:   Choledocholithiasis Active Problems:   Atrial fibrillation (HCC)   ARF (acute renal failure) (HCC)   UTI (urinary tract infection)   Elevated troponin   Pressure injury of skin   Acute kidney injury - Due to rhabdomyolysis and dehydration. Improving with IVF, continue. Follow BMP in a.m. Renal ultrasound without acute abnormality. Creatinine had peaked to 2.41. This has gradually improved to 1.41 today. Baseline creatinine may be in the 1.3-1.4 range.Acute kidney injury resolved. Creatinine at baseline. DC IV fluids.  Rhabdomyolysis - Sustained following fall at home and was down for several hours. CK on admission >14,000. Treating with IV fluids and CK was down to 7400 yesterday. CK continues to gradually improve, 277 today. DC IV fluids.  Choledocholithiasis - Patient is status post cholecystectomy. No GI symptoms now or earlier. GI input appreciated. Agree that his abnormal LFTs are likely due to rhabdomyolysis rather than gallstones. However due to CT findings, patient underwent ERCP, sphincterotomy, large CBD stone extraction and lithotripsy on 10/18. Follow LFTs in a.m. and expect improvement. GI signed off and recommend follow-up when necessary. - LFTs continued to gradually improve.  Elevated  troponin Likely due to significant rhabdomyolysis and acute kidney injury. No chest pain reported.  UTI, suspected Remains on IV Zosyn,, day 4 today and discontinued 10/19.  Permanent A. fib Rate controlled on carvedilol. Xarelto on hold and as per GI, resume 2 days after ERCP if no bleeding complications from procedure. As recommended by GI, resumed xarelto 10/20.  Essential hypertension Controlled. Continue Imdur and carvedilol. Holding lisinopril due to acute kidney injury.  Hyperlipidemia Continues Zetia  Seizure disorder Continue Keppra. Denies recent seizures.  Thrombocytopenia Likely related to rhabdomyolysis. Resolved.  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 PT evaluation >recommend SNF. Social worker consulted.  DVT prophylaxis: SCD's Code Status: DO NOT RESUSCITATE Family Communication: Discussed in detail with patient's son. Updated care and answered questions. Disposition: Stable for DC to SNF when bed available. I discussed with clinical Child psychotherapist.   Consultants:  [facility]   Procedures:  None  Antimicrobials:  IV Zosyn    Subjective: Patient denies complaints. No abdominal pain or pain elsewhere reported.  ROS: As above  Objective:  Vitals:   02/28/17 1252 02/28/17 2148 03/01/17 0537 03/01/17 0831  BP: 132/87 134/77 127/86 124/72  Pulse: 65 (!) 59 (!) 59 62  Resp: 18 17 17    Temp: 97.6 F (36.4 C) 97.6 F (36.4 C) (!) 97.5 F (36.4 C)   TempSrc: Oral Oral Oral   SpO2: 99% 97% 98%   Weight:      Height:        Examination:  General exam: Elderly male,  moderately built and nourished lying comfortably propped up in bed.Stable. Respiratory system: Clear to auscultation. Respiratory effort normal. Stable. Cardiovascular system: S1 & S2 heard, irregularly irregular. No JVD, murmurs, rubs, gallops or  clicks. No pedal edema.  Gastrointestinal system: Abdomen is nondistended, soft and nontender. No organomegaly or masses felt. Normal bowel sounds heard. Stable. Central nervous system: Alert and oriented. No focal neurological deficits. Stable. Extremities: Symmetric 5 x 5 power. Right leg with 2 superficial cuts with dressing clean and dry. Trace right lower extremity edema and mild right knee swelling without acute findings. Doing much better. Able to lift off right lower extremity off the bed without much discomfort. Skin: No rashes, lesions or ulcers Psychiatry: Judgement and insight appear normal. Mood & affect appropriate.     Data Reviewed: I have personally reviewed following labs and imaging studies  CBC:  Recent Labs Lab 02/23/17 1920  02/25/17 0950 02/26/17 0502 02/27/17 0502 02/28/17 0558 03/01/17 0457  WBC 16.7*  < > 13.1* 8.9 8.3 9.0 8.8  NEUTROABS 14.8*  --  11.0*  --  6.3  --   --   HGB 16.7  < > 16.6 14.7 14.2 15.5 15.8  HCT 48.8  < > 48.5 44.6 43.8 45.7 46.5  MCV 94.0  < > 93.3 92.7 94.0 93.3 93.6  PLT 155  < > 124* 141* 127* 140* 183  < > = values in this interval not displayed. Basic Metabolic Panel:  Recent Labs Lab 02/25/17 0950 02/26/17 0502 02/27/17 0502 02/28/17 0558 03/01/17 0457  NA 141 136 138 138 137  K 4.5 3.8 4.1 4.0 3.9  CL 112* 107 107 108 110  CO2 17* 19* 23 23 18*  GLUCOSE 84 103* 115* 96 105*  BUN 35* 28* 28* 25* 23*  CREATININE 1.56* 1.32* 1.60* 1.41* 1.34*  CALCIUM 8.8* 8.2* 8.3* 8.6* 8.8*   Liver Function Tests:  Recent Labs Lab 02/25/17 0950 02/26/17 0502 02/27/17 0502 02/28/17 0558 03/01/17 0457  AST 283* 249* 204* 154* NOT DONE  ALT 292* 289* 294* 259* NOT DONE  ALKPHOS 184* 154* 118 117 114  BILITOT 3.0* 2.4* 1.8* 1.6* NOT DONE  PROT 6.4* 5.7* 5.6* 5.6* 6.1*  ALBUMIN 3.0* 2.7* 2.4* 2.5* 2.7*   Coagulation Profile:  Recent Labs Lab 02/23/17 1920 02/24/17 0747 02/25/17 0950  INR 4.19* 3.40 1.77   Cardiac  Enzymes:  Recent Labs Lab 02/23/17 1920 02/24/17 0747 02/25/17 1942 02/26/17 0502 02/27/17 0502 02/28/17 0558 03/01/17 0457  CKTOTAL 14,105* 7,397*  7,397* 2,041* 1,522* 568* 464* 277  CKMB  --  133.5*  --   --   --   --   --   TROPONINI 0.49* 0.28*  --   --   --   --   --         Radiology Studies: No results found.      Scheduled Meds: . carvedilol  3.125 mg Oral BID WC  . ezetimibe  10 mg Oral Daily  . isosorbide mononitrate  30 mg Oral Daily  . levETIRAcetam  250 mg Oral BID  . mouth rinse  15 mL Mouth Rinse BID  . polyethylene glycol  17 g Oral BID  . rivaroxaban  15 mg Oral Q supper  . senna  2 tablet Oral Daily   Continuous Infusions:    LOS: 6 days     HONGALGI,ANAND, MD, FACP, FHM. [facility] Pager [phone]  If 7PM-7AM, please contact night-coverage [email or web address] Password [email or web address] 03/01/2017, 3:16 PM

## 2017-03-02 LAB — COMPREHENSIVE METABOLIC PANEL
ALT: 152 U/L — AB (ref 17–63)
AST: 66 U/L — AB (ref 15–41)
Albumin: 2.6 g/dL — ABNORMAL LOW (ref 3.5–5.0)
Alkaline Phosphatase: 109 U/L (ref 38–126)
Anion gap: 4 — ABNORMAL LOW (ref 5–15)
BILIRUBIN TOTAL: 1.4 mg/dL — AB (ref 0.3–1.2)
BUN: 21 mg/dL — AB (ref 6–20)
CO2: 19 mmol/L — ABNORMAL LOW (ref 22–32)
CREATININE: 1.2 mg/dL (ref 0.61–1.24)
Calcium: 8.7 mg/dL — ABNORMAL LOW (ref 8.9–10.3)
Chloride: 112 mmol/L — ABNORMAL HIGH (ref 101–111)
GFR, EST NON AFRICAN AMERICAN: 53 mL/min — AB (ref >60)
Glucose, Bld: 104 mg/dL — ABNORMAL HIGH (ref 65–99)
Potassium: 3.9 mmol/L (ref 3.5–5.1)
Sodium: 135 mmol/L (ref 135–145)
TOTAL PROTEIN: 5.9 g/dL — AB (ref 6.5–8.1)

## 2017-03-02 NOTE — Progress Notes (Signed)
PROGRESS NOTE   Daniel Brown  VHQ:[id number]    DOB: 02/14/1928    DOA: 02/23/2017  PCP: Daniel HaiWalters, [name], MD (Inactive)   I have briefly reviewed patients previous medical records in [facility].  Brief Narrative:  83 year old male with PMH of CAD, ischemic cardiomyopathy, complete heart block status post PPM, COPD, CVA, HTN, HLD, permanent A. Fib on Xarelto, seizure disorder, sustained a mechanical fall at home and was down for several hours, [facility] acute kidney injury due to rhabdomyolysis and dehydration, UTI, abnormal LFTs and an incidental CBD stones on CT abdomen. [facility] for [facility] evaluation.   Assessment & Plan:   Principal Problem:   Choledocholithiasis Active Problems:   Atrial fibrillation (HCC)   ARF (acute renal failure) (HCC)   UTI (urinary tract infection)   Elevated troponin   Pressure injury of skin   Acute kidney injury - Due to rhabdomyolysis and dehydration. Renal ultrasound without acute abnormality. Creatinine had peaked to 2.41. Baseline creatinine may be in the 1.3-1.4 range. Treated with IV fluids and Acute kidney injury resolved.   Rhabdomyolysis - Sustained following fall at home and was down for several hours. CK on admission >14,000. Treated with IV fluids and resolved.  Choledocholithiasis - Patient is status post cholecystectomy.  - S/P ERCP, sphincterotomy, large CBD stone extraction and lithotripsy on 10/18. LFTs continued to improve. GI signed off and recommend follow-up when necessary.  Elevated troponin Likely due to significant rhabdomyolysis and acute kidney injury. No chest pain reported.  UTI, suspected Completed a course of IV Zosyn.  Permanent A. fib Rate controlled on carvedilol. Xarelto was temporarily held for ERCP and then resumed.  Essential hypertension Controlled. Continue Imdur and carvedilol. Holding lisinopril due to recent acute kidney injury.  Hyperlipidemia Continues  Zetia  Seizure disorder Continue Keppra. Denies recent seizures.  Thrombocytopenia Likely related to rhabdomyolysis. Resolved.  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 PT evaluation >recommend SNF. Social worker consulted.  DVT prophylaxis: SCD's Code Status: DO NOT RESUSCITATE Family Communication: None at bedside today. Disposition: Stable for DC to SNF when bed available, possibly 10/23 as discussed with Child psychotherapistsocial worker.   Consultants:  [facility]   Procedures:  S/P ERCP, sphincterotomy, large CBD stone extraction and lithotripsy on 10/18.  Antimicrobials:  IV Zosyn -completed   Subjective: Patient denies complaints. Denies pain. Tolerating diet. Having BM.  ROS: As above  Objective:  Vitals:   03/01/17 1500 03/01/17 2235 03/02/17 0456 03/02/17 1311  BP: 133/85 119/71 130/77 128/84  Pulse: 66 (!) 57 64 66  Resp: 20 18 20 18   Temp: 98.3 F (36.8 C) 97.7 F (36.5 C) 98.1 F (36.7 C) 98 F (36.7 C)  TempSrc: Oral Oral Oral Oral  SpO2: 97% 100% 97% 98%  Weight:      Height:        Examination:  General exam: Elderly male, moderately built and nourished lying comfortably propped up in bed.Stable Respiratory system: Clear to auscultation. Respiratory effort normal. Stable Cardiovascular system: S1 & S2 heard, irregularly irregular. No JVD, murmurs, rubs, gallops or clicks. No pedal edema.  Gastrointestinal system: Abdomen is nondistended, soft and nontender. No organomegaly or masses felt. Normal bowel sounds heard. Stable Central nervous system: Alert and oriented. No focal neurological deficits. Stable. Extremities: Symmetric 5 x 5 power. No acute findings. Skin: No rashes,  lesions or ulcers Psychiatry: Judgement and insight appear normal. Mood & affect appropriate.     Data Reviewed: I have personally reviewed following  labs and imaging studies  CBC:  Recent Labs Lab 02/23/17 1920  02/25/17 0950 02/26/17 0502 02/27/17 0502 02/28/17 0558 03/01/17 0457  WBC 16.7*  < > 13.1* 8.9 8.3 9.0 8.8  NEUTROABS 14.8*  --  11.0*  --  6.3  --   --   HGB 16.7  < > 16.6 14.7 14.2 15.5 15.8  HCT 48.8  < > 48.5 44.6 43.8 45.7 46.5  MCV 94.0  < > 93.3 92.7 94.0 93.3 93.6  PLT 155  < > 124* 141* 127* 140* 183  < > = values in this interval not displayed. Basic Metabolic Panel:  Recent Labs Lab 02/26/17 0502 02/27/17 0502 02/28/17 0558 03/01/17 0457 03/02/17 0508  NA 136 138 138 137 135  K 3.8 4.1 4.0 3.9 3.9  CL 107 107 108 110 112*  CO2 19* 23 23 18* 19*  GLUCOSE 103* 115* 96 105* 104*  BUN 28* 28* 25* 23* 21*  CREATININE 1.32* 1.60* 1.41* 1.34* 1.20  CALCIUM 8.2* 8.3* 8.6* 8.8* 8.7*   Liver Function Tests:  Recent Labs Lab 02/26/17 0502 02/27/17 0502 02/28/17 0558 03/01/17 0457 03/02/17 0508  AST 249* 204* 154* NOT DONE 66*  ALT 289* 294* 259* NOT DONE 152*  ALKPHOS 154* 118 117 114 109  BILITOT 2.4* 1.8* 1.6* NOT DONE 1.4*  PROT 5.7* 5.6* 5.6* 6.1* 5.9*  ALBUMIN 2.7* 2.4* 2.5* 2.7* 2.6*   Coagulation Profile:  Recent Labs Lab 02/23/17 1920 02/24/17 0747 02/25/17 0950  INR 4.19* 3.40 1.77   Cardiac Enzymes:  Recent Labs Lab 02/23/17 1920 02/24/17 0747 02/25/17 1942 02/26/17 0502 02/27/17 0502 02/28/17 0558 03/01/17 0457  CKTOTAL 14,105* 7,397*  7,397* 2,041* 1,522* 568* 464* 277  CKMB  --  133.5*  --   --   --   --   --   TROPONINI 0.49* 0.28*  --   --   --   --   --         Radiology Studies: No results found.      Scheduled Meds: . carvedilol  3.125 mg Oral BID WC  . ezetimibe  10 mg Oral Daily  . isosorbide mononitrate  30 mg Oral Daily  . levETIRAcetam  250 mg Oral BID  . mouth rinse  15 mL Mouth Rinse BID  . polyethylene glycol  17 g Oral BID  . rivaroxaban  15 mg Oral Q supper  . senna  2 tablet Oral Daily   Continuous Infusions:    LOS: 7  days     Leni Pankonin, MD, FACP, FHM. [facility] Pager [phone]  If 7PM-7AM, please contact night-coverage [email or web address] Password [facility] 03/02/2017, 4:54 PM

## 2017-03-02 NOTE — Clinical Social Work Note (Signed)
Clinical Social Work Assessment  Patient Details  Name: Daniel Brown MRN: [id number] Date of Birth: 05-07-1928  Date of referral:  03/02/17               Reason for consult:  Facility Placement                Permission sought to share information with:  Facility Sport and exercise psychologist, Family Supports Permission granted to share information::  Yes, Verbal Permission Granted  Name::     Daniel Brown, Daniel Brown  Agency::  SNF  Relationship::  Son, Daughter-in-law  Contact Information:     Housing/Transportation Living arrangements for the past 2 months:  Single Family Home Source of Information:  Patient, Adult Children Patient Interpreter Needed:  None Criminal Activity/Legal Involvement Pertinent to Current Situation/Hospitalization:  No - Comment as needed Significant Relationships:  Adult Children, Spouse Lives with:  Self Do you feel safe going back to the place where you live?  Yes Need for family participation in patient care:  Yes (Comment) (patient has confusion at times)  Care giving concerns:  Patient has been living at home alone but will need short term rehab at discharge to improve ability to independently care for self.   Social Worker assessment / plan:  CSW met with patient earlier today to discuss recommendation for SNF and present bed offers. CSW discussed options with patient. CSW later met with patient's son and daughter-in-law at bedside to discuss bed offers and placement options. CSW answered questions. CSW sent referral to [facility] in [place]. CSW will follow up tomorrow to facilitate discharge when medically ready.  Employment status:  Retired Nurse, adult PT Recommendations:  [place] / Referral to community resources:  [place]  Patient/Family's Response to care:  Patient and family agreeable to SNF placement.  Patient/Family's Understanding of and Emotional Response to Diagnosis, Current  Treatment, and Prognosis:  Patient and family are appreciative of CSW assistance. Patient and family discussed how it would be convenient to have the patient placed at [facility] because his wife is there.   Emotional Assessment Appearance:  Appears stated age Attitude/Demeanor/Rapport:    Affect (typically observed):  Appropriate Orientation:  Oriented to Self, Oriented to Place, Oriented to  Time, Oriented to Situation Alcohol / Substance use:  Not Applicable Psych involvement (Current and /or in the community):  No (Comment)  Discharge Needs  Concerns to be addressed:  Care Coordination Readmission within the last 30 days:  No Current discharge risk:  Lives alone, Physical Impairment Barriers to Discharge:  Continued Medical Work up   [employer], [place] 03/02/2017, 5:15 PM

## 2017-03-02 NOTE — Discharge Instructions (Signed)

## 2017-03-02 NOTE — [place] FL2 (Signed)
[place] MEDICAID FL2 LEVEL OF CARE SCREENING TOOL     IDENTIFICATION  Patient Name: Daniel Brown Birthdate: 11/02/1927 Sex: male Admission Date (Current Location): 02/23/2017  [facility] [place]  [place]   Facility and Address:  [facility] [place], [address], [place], [place] [address]      Provider Number: [id number]  Attending Physician Name and Address:  Elease EtienneHongalgi, Anand D, MD  Relative Name and Phone Number:       Current Level of Care: Hospital Recommended Level of Care: Skilled Nursing Facility Prior Approval Number:    Date Approved/Denied:   PASRR Number: [id number]  Discharge Plan: SNF    Current Diagnoses: Patient Active Problem List   Diagnosis Date Noted  . Pressure injury of skin 02/24/2017  . Non-traumatic rhabdomyolysis   . AKI (acute kidney injury) (HCC)   . Choledocholithiasis 02/23/2017  . ARF (acute renal failure) (HCC) 02/23/2017  . UTI (urinary tract infection) 02/23/2017  . Elevated troponin 02/23/2017  . Paroxysmal atrial fibrillation (HCC) 10/17/2015  . Therapeutic drug monitoring 10/17/2015  . Pacemaker-St.Jude 02/24/2012  . Complete heart block (HCC) 01/20/2011  . Chronic systolic dysfunction of left ventricle 09/23/2010  . Long term current use of anticoagulant 07/30/2010  . Coronary atherosclerosis 07/04/2009  . HYPERLIPIDEMIA 07/03/2009  . Essential hypertension 07/03/2009  . Atrial fibrillation (HCC) 07/03/2009  . CHF 07/03/2009  . COPD 07/03/2009  . SEIZURE DISORDER 07/03/2009    Orientation RESPIRATION BLADDER Height & Weight     Self, Time, Situation, Place  Normal Continent Weight: 174 lb 15.3 oz (79.4 kg) Height:  5\' 9"  (175.3 cm)  BEHAVIORAL SYMPTOMS/MOOD NEUROLOGICAL BOWEL NUTRITION STATUS    Convulsions/Seizures Incontinent, Continent (incontinent at times) Diet (mechanical soft)  AMBULATORY STATUS COMMUNICATION OF NEEDS Skin   Extensive Assist Verbally Skin abrasions (right  knee and ankle abrasions, 02/24/17; foam dressing)                       Personal Care Assistance Level of Assistance  Bathing, Dressing Bathing Assistance: Maximum assistance   Dressing Assistance: Maximum assistance     Functional Limitations Info  Hearing   Hearing Info: Impaired      SPECIAL CARE FACTORS FREQUENCY  PT (By licensed PT), OT (By licensed OT)     PT Frequency: 5x/wk OT Frequency: 5x/wk            Contractures      Additional Factors Info  Code Status, Allergies Code Status Info: DNR Allergies Info: KNA           Current Medications (03/02/2017):  This is the current hospital active medication list Current Facility-Administered Medications  Medication Dose Route Frequency Provider Last Rate Last Dose  . acetaminophen (TYLENOL) tablet 650 mg  650 mg Oral Q6H PRN Pearson GrippeKim, James, MD   650 mg at 03/02/17 0203   Or  . acetaminophen (TYLENOL) suppository 650 mg  650 mg Rectal Q6H PRN Pearson GrippeKim, James, MD      . carvedilol (COREG) tablet 3.125 mg  3.125 mg Oral BID WC Pearson GrippeKim, James, MD   3.125 mg at 03/02/17 0840  . ezetimibe (ZETIA) tablet 10 mg  10 mg Oral Daily Pearson GrippeKim, James, MD   10 mg at 03/02/17 0839  . isosorbide mononitrate (IMDUR) 24 hr tablet 30 mg  30 mg Oral Daily Pearson GrippeKim, James, MD   30 mg at 03/02/17 0840  . levETIRAcetam (KEPPRA) tablet 250 mg  250 mg Oral BID  Pearson Grippe, MD   250 mg at 03/02/17 0840  . MEDLINE mouth rinse  15 mL Mouth Rinse BID Elease Etienne, MD   15 mL at 02/28/17 2115  . ondansetron (ZOFRAN) injection 4 mg  4 mg Intravenous Once PRN Mal Amabile, MD      . polyethylene glycol (MIRALAX / GLYCOLAX) packet 17 g  17 g Oral BID Elease Etienne, MD   17 g at 03/02/17 0840  . Rivaroxaban (XARELTO) tablet 15 mg  15 mg Oral Q supper Elease Etienne, MD   15 mg at 03/01/17 1856  . senna (SENOKOT) tablet 17.2 mg  2 tablet Oral Daily Elease Etienne, MD   17.2 mg at 03/02/17 0840     Discharge Medications: Please see discharge  summary for a list of discharge medications.  Relevant Imaging Results:  Relevant Lab Results:   Additional Information SS#: [id number]  Baldemar Lenis, LCSW

## 2017-03-03 LAB — COMPREHENSIVE METABOLIC PANEL
ALBUMIN: 2.5 g/dL — AB (ref 3.5–5.0)
ALT: 120 U/L — ABNORMAL HIGH (ref 17–63)
ANION GAP: 6 (ref 5–15)
AST: 54 U/L — AB (ref 15–41)
Alkaline Phosphatase: 104 U/L (ref 38–126)
BUN: 16 mg/dL (ref 6–20)
CHLORIDE: 109 mmol/L (ref 101–111)
CO2: 23 mmol/L (ref 22–32)
Calcium: 9 mg/dL (ref 8.9–10.3)
Creatinine, Ser: 1.23 mg/dL (ref 0.61–1.24)
GFR calc Af Amer: 59 mL/min — ABNORMAL LOW (ref >60)
GFR calc non Af Amer: 51 mL/min — ABNORMAL LOW (ref >60)
GLUCOSE: 100 mg/dL — AB (ref 65–99)
POTASSIUM: 4 mmol/L (ref 3.5–5.1)
SODIUM: 138 mmol/L (ref 135–145)
TOTAL PROTEIN: 5.5 g/dL — AB (ref 6.5–8.1)
Total Bilirubin: 1.3 mg/dL — ABNORMAL HIGH (ref 0.3–1.2)

## 2017-03-03 NOTE — Discharge Summary (Addendum)
Physician Discharge Summary  Daniel Brown ZOX:[id number] DOB: 1927-09-08 DOA: 02/23/2017  PCP: [name], MD (Inactive)  Admit date: 02/23/2017 Discharge date: 03/03/2017  Time spent: 35 minutes  Recommendations for Outpatient Follow-up:  1. Patient requires basic metabolic panel in about 1 week at skilled facility as have resumed Lasix which was held earlier in admission--also was on lisinopril at the time 2. Recommend CBC plus differential in about 1 week as well 3. Will require skilled nursing care with regards to acute fall as per therapy recommendations 4. Needs follow-up of ectatic infrarenal abdominal aorta to 2.6 cm-not suggest ultrasound 6-12 months time   Discharge Diagnoses:  Principal Problem:   Choledocholithiasis Active Problems:   Atrial fibrillation (HCC)   ARF (acute renal failure) (HCC)   UTI (urinary tract infection)   Elevated troponin   Pressure injury of skin   Discharge Condition: Improved  Diet recommendation: Heart healthy  Filed Weights   02/27/17 0525 02/28/17 0524 03/03/17 0522  Weight: 78 kg (172 lb) 79.4 kg (174 lb 15.3 oz) 75.8 kg (167 lb)    History of present illness:   83 year old white male with history of CVA, paroxysmal A. fib chads score >4, seizure disorder, cardiomyopathy with EF 40% Admitted from home to [facility] hospital 10.15 with mechanical fall and was found down on the ground for about 7 hours In the emergency room WBC was 16 hemoglobin 16 platelets 155 BUN/creatinine was 43/2.4 point-of-care troponin 0.4 CPK 14,000 LFTs were elevated AST/ALT 517 326 bilirubin 3.2   ultimately patient was transferred over For GI consultation to [facility] Course:   Acute kidney injury - Due to rhabdomyolysis and dehydration. Renal ultrasound without acute abnormality.  Creatinine had peaked to 2.41. Baseline creatinine  in the 1.3-1.4 range.  Treated with IV fluids and Acute kidney injury resolved.  Will  require repeat Chem-7 in about 1 week as have resumed Lasix Monday Wednesday Friday dosing and lisinopril  Rhabdomyolysis - Sustained following fall at home and was down for several hours.  CK on admission >14,000. Treated with IV fluids and on discharge was less than 200  Choledocholithiasis - Patient is status post cholecystectomy.  - S/P ERCP, sphincterotomy, large CBD stone extraction and lithotripsy on 10/18.  LFTs continued to improve. GI signed off and recommend follow-up when necessary.  Elevated troponin Likely due to significant rhabdomyolysis and acute kidney injury. No chest pain reported.  UTI, suspected Completed a course of IV Zosyn.  Permanent A. fib Rate controlled on carvedilol. Xarelto was temporarily held for ERCP and then resumed.  Essential hypertension Controlled. Continue Imdur and carvedilol. Holding lisinopril due to recent acute kidney injury.  This was resumed on discharge  Hyperlipidemia Continues Zetia  Seizure disorder Continue Keppra. Denies recent seizures.  Thrombocytopenia Likely related to rhabdomyolysis. Resolved.  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 PT evaluation >recommend SNF. Social worker consulted.   Consultants:  [facility]   Procedures:  S/P ERCP, sphincterotomy, large CBD stone extraction and lithotripsy on 10/18.  Antimicrobials:  IV Zosyn -completed   Discharge Exam: Vitals:   03/02/17 2147 03/03/17 0522  BP: 123/74 126/72  Pulse: 67 68  Resp: 17 18  Temp: 98.6 F (37 C) 98.6 F (37 C)  SpO2: 97% 96%    Patient awake alert in no distress Eating breakfast at the bedside No  fever no chills no cough no cold had some constipation earlier in admission No dysuria Passing urine fairly well  General: EOMI NCAT Cardiovascular: S1-S2 no murmur rub or  gallop Respiratory: Clinically clear no added sounds Abdomen soft nontender no rebound Moving all 4 limbs equally good power in lower extremities  Discharge Instructions    Current Discharge Medication List    CONTINUE these medications which have NOT CHANGED   Details  carvedilol (COREG) 3.125 MG tablet Take 3.125 mg by mouth 2 (two) times daily.     Cholecalciferol (VITAMIN D) 2000 UNITS CAPS Take 2,000 Units by mouth daily.     ezetimibe (ZETIA) 10 MG tablet Take 10 mg by mouth daily.    furosemide (LASIX) 40 MG tablet Take 20 mg by mouth 3 (three) times a week. Monday, Wednesday and Friday    isosorbide mononitrate (IMDUR) 30 MG 24 hr tablet Take 30 mg by mouth daily.     levETIRAcetam (KEPPRA) 250 MG tablet Take 250 mg by mouth 2 (two) times daily.     lisinopril (PRINIVIL,ZESTRIL) 2.5 MG tablet Take 1 tablet (2.5 mg total) by mouth daily. Qty: 90 tablet, Refills: 3    NITROSTAT 0.4 MG SL tablet Place 0.4 mg under the tongue as needed for chest pain.     XARELTO 20 MG TABS tablet TAKE 1 TABLET BY MOUTH  DAILY Qty: 90 tablet, Refills: 3       No Known Allergies    The results of significant diagnostics from this hospitalization (including imaging, microbiology, ancillary and laboratory) are listed below for reference.    Significant Diagnostic Studies: Ct Abdomen Pelvis Wo Contrast  Result Date: 02/23/2017 CLINICAL DATA:  Fell in bath from with leg pain EXAM: CT ABDOMEN AND PELVIS WITHOUT CONTRAST TECHNIQUE: Multidetector CT imaging of the abdomen and pelvis was performed following the standard protocol without IV contrast. COMPARISON:  Radiographs 02/23/2017, CT 08/04/2014 FINDINGS: Lower chest: Mild subpleural fibrosis. No acute consolidation or effusion. Cardiomegaly with partially visualized pacing leads. Coronary artery calcification. Dense mitral annular calcification. Hepatobiliary: Subcentimeter hypodensity within the anterior liver, not significantly changed.  Interval increase in intra and extrahepatic biliary dilatation with extrahepatic bile duct measuring up to 16 mm. Multiple hyperdense foci a within the extrahepatic common bile duct, measuring up to 14 mm, consistent with stones. Pancreas: Unremarkable. No pancreatic ductal dilatation or surrounding inflammatory changes. Spleen: Normal in size without focal abnormality. Adrenals/Urinary Tract: Adrenal glands are within normal limits. Bilateral renal cysts. No hydronephrosis. Bladder unremarkable Stomach/Bowel: Stomach is nonenlarged. No dilated small bowel. No colon wall thickening. Moderate feces in the rectum. Vascular/Lymphatic: Extensive atherosclerotic calcification. Ectatic infrarenal abdominal aorta, measuring up to 2.6 cm. No significantly enlarged lymph nodes. Reproductive: Enlarged prostate gland with mass effect on the bladder Other: Negative for free air or free fluid. Musculoskeletal: No acute or suspicious bone lesion. Nonspecific sclerotic foci in the proximal right femur. Degenerative changes of the spine. Vertebral body heights appear maintained. IMPRESSION: 1. No CT evidence for acute intra-abdominal or pelvic abnormality. 2. Interval increase in intra and extrahepatic biliary dilatation status post cholecystectomy. Development of multiple hyperdense foci within the dilated common bile duct, consistent with stones/ choledocholithiasis. 3. Cardiomegaly.  Mild subpleural bibasilar fibrosis. 4. Ectatic infrarenal abdominal aorta up to 2.6 cm. Ectatic abdominal aorta at risk for aneurysm development. Recommend followup by ultrasound in 5 years. This recommendation follows ACR consensus guidelines: White Paper of the ACR Incidental Findings Committee II on Vascular Findings. J Am Coll Radiol 2013;  95:621-308. 5. Enlarged prostate gland with mass effect on the bladder. Clinical correlation recommended. Electronically Signed   By: Jasmine Pang M.D.   On: 02/23/2017 22:19   Dg Chest 1 View  Result  Date: 02/23/2017 CLINICAL DATA:  Fall EXAM: CHEST 1 VIEW COMPARISON:  09/13/2012 FINDINGS: Left-sided single lead pacing device as before. Cardiomegaly with central congestion. Coarse interstitial opacity likely due to chronic change. No consolidation or effusion. Aortic atherosclerosis. No pneumothorax. IMPRESSION: 1. Cardiomegaly 2. Coarse bilateral interstitial opacity consistent with chronic change. No acute infiltrate or edema. Electronically Signed   By: Jasmine Pang M.D.   On: 02/23/2017 20:49   Dg Tibia/fibula Right  Result Date: 02/23/2017 CLINICAL DATA:  Fall with leg pain EXAM: RIGHT TIBIA AND FIBULA - 2 VIEW COMPARISON:  None. FINDINGS: No fracture or malalignment. Vascular calcifications. Marked arthritis at the medial compartment. Chondrocalcinosis. Patellofemoral degenerative changes. IMPRESSION: 1. No acute osseous abnormality 2. Chondrocalcinosis 3. Moderate severe arthritis of the knee Electronically Signed   By: Jasmine Pang M.D.   On: 02/23/2017 20:47   Ct Head Wo Contrast  Result Date: 02/23/2017 CLINICAL DATA:  Patient fell trying to get to the bathroom. Patient found on floor. RIGHT-sided weakness is chronic. EXAM: CT HEAD WITHOUT CONTRAST TECHNIQUE: Contiguous axial images were obtained from the base of the skull through the vertex without intravenous contrast. COMPARISON:  CT head 09/13/2012.  MR head 05/12/2009. FINDINGS: Brain: No evidence for acute infarction, hemorrhage, mass lesion, hydrocephalus, or extra-axial fluid. Generalized atrophy. Chronic microvascular ischemic change. Encephalomalacia of the LEFT frontal, temporal, and insular regions, also involving the basal ganglia, related to chronic infarctions, which were acute in 2011. Vascular: Calcification of the cavernous internal carotid arteries consistent with cerebrovascular atherosclerotic disease. No signs of intracranial large vessel occlusion. Skull: Normal. Negative for fracture or focal lesion. Sinuses/Orbits:  No acute finding.  BILATERAL cataract extraction. Other: None. IMPRESSION: Atrophy and small vessel disease.  Old LEFT MCA territory infarct. No acute intracranial findings. Electronically Signed   By: Elsie Stain M.D.   On: 02/23/2017 20:51   US Renal  Result Date: 02/24/2017 CLINICAL DATA:  Acute renal failure. EXAM: RENAL / URINARY TRACT ULTRASOUND COMPLETE COMPARISON:  CT abdomen pelvis dated February 23, 2017. FINDINGS: Right Kidney: Length: 10.6 cm. Echogenicity within normal limits. No mass or hydronephrosis visualized. There is a 3.3 x 2.3 x 2.3 cm simple cyst arising from the superior pole. Left Kidney: Length: 12.3 cm. Echogenicity within normal limits. No mass or hydronephrosis visualized. There is a 10.1 x 7.5 x 7.8 cm simple appearing cyst arising from the superior pole. Bladder: Appears normal for degree of bladder distention. IMPRESSION: No acute abnormality.  Bilateral renal cysts. Electronically Signed   By: Obie Dredge M.D.   On: 02/24/2017 10:17   Ct Hip Right Wo Contrast  Result Date: 02/23/2017 CLINICAL DATA:  Right hip pain radiating to the foot after fall at 2 a.m. this morning. EXAM: CT OF THE RIGHT HIP WITHOUT CONTRAST TECHNIQUE: Multidetector CT imaging of the right hip was performed according to the standard protocol. Multiplanar CT image reconstructions were also generated. COMPARISON:  None. FINDINGS: Bones/Joint/Cartilage Axial joint space narrowing of the right hip with spurring about the femoral head- neck junction. No joint effusion, fracture dislocation. Intact right superior and inferior pubic rami and pubic symphysis. Bone island noted of the intratrochanteric portion of the right femur. Ligaments Suboptimally assessed by CT. Muscles and Tendons No intramuscular hemorrhage or edema. Intramuscular lipoma of the right  iliopsoas measuring 2.3 x 1.4 x 1.9 cm. Soft tissues Soft tissue edema along the lateral aspect of the right pelvis and hip. IMPRESSION: 1.  Osteoarthritis of the right hip. 2. No acute fracture or malalignment. 3. Small 2.3 x 1.4 x 1.9 cm lipoma of the right iliopsoas. 4. Mild soft tissue contusion along the lateral aspect of the right hip and pelvis. Electronically Signed   By: Tollie Eth M.D.   On: 02/23/2017 22:09   Dg Ercp  Result Date: 02/26/2017 CLINICAL DATA:  Choledocholithiasis EXAM: ERCP TECHNIQUE: Multiple spot images obtained with the fluoroscopic device and submitted for interpretation post-procedure. COMPARISON:  CT 02/23/2017 FINDINGS: A series of fluoroscopic spot images document endoscopic cannulation and opacification of the CBD. Multiple filling defects consistent with choledocholithiasis. Subsequent images document passage of a balloon tipped catheter through the CBD. There is limited opacification of the intrahepatic biliary tree, which appears mildly distended centrally. IMPRESSION: 1. Choledocholithiasis with endoscopic intervention. These images were submitted for radiologic interpretation only. Please see the procedural report for the amount of contrast and the fluoroscopy time utilized. Electronically Signed   By: Corlis Leak M.D.   On: 02/26/2017 15:15   Dg Femur Min 2 Views Right  Result Date: 02/23/2017 CLINICAL DATA:  Fall with leg pain EXAM: RIGHT FEMUR 2 VIEWS COMPARISON:  None. FINDINGS: Extensive vascular calcifications. Pubic symphysis and rami appear intact. Questionable step-off deformity at the right femoral head neck junction. No dislocation. Mild to moderate arthritis at the right hip. IMPRESSION: 1. Questionable step-off deformity at the right femoral head neck junction, CT recommended for further evaluation. 2. Extensive vascular calcifications. Electronically Signed   By: Jasmine Pang M.D.   On: 02/23/2017 20:46    Microbiology: No results found for this or any previous visit (from the past 240 hour(s)).   Labs: Basic Metabolic Panel:  Recent Labs Lab 02/27/17 0502 02/28/17 0558  03/01/17 0457 03/02/17 0508 03/03/17 0727  NA 138 138 137 135 138  K 4.1 4.0 3.9 3.9 4.0  CL 107 108 110 112* 109  CO2 23 23 18* 19* 23  GLUCOSE 115* 96 105* 104* 100*  BUN 28* 25* 23* 21* 16  CREATININE 1.60* 1.41* 1.34* 1.20 1.23  CALCIUM 8.3* 8.6* 8.8* 8.7* 9.0   Liver Function Tests:  Recent Labs Lab 02/27/17 0502 02/28/17 0558 03/01/17 0457 03/02/17 0508 03/03/17 0727  AST 204* 154* NOT DONE 66* 54*  ALT 294* 259* NOT DONE 152* 120*  ALKPHOS 118 117 114 109 104  BILITOT 1.8* 1.6* NOT DONE 1.4* 1.3*  PROT 5.6* 5.6* 6.1* 5.9* 5.5*  ALBUMIN 2.4* 2.5* 2.7* 2.6* 2.5*    Recent Labs Lab 02/25/17 0950  LIPASE 28   No results for input(s): AMMONIA in the last 168 hours. CBC:  Recent Labs Lab 02/25/17 0950 02/26/17 0502 02/27/17 0502 02/28/17 0558 03/01/17 0457  WBC 13.1* 8.9 8.3 9.0 8.8  NEUTROABS 11.0*  --  6.3  --   --   HGB 16.6 14.7 14.2 15.5 15.8  HCT 48.5 44.6 43.8 45.7 46.5  MCV 93.3 92.7 94.0 93.3 93.6  PLT 124* 141* 127* 140* 183   Cardiac Enzymes:  Recent Labs Lab 02/25/17 1942 02/26/17 0502 02/27/17 0502 02/28/17 0558 03/01/17 0457  CKTOTAL 2,041* 1,522* 568* 464* 277   BNP: BNP (last 3 results) No results for input(s): BNP in the last 8760 hours.  ProBNP (last 3 results) No results for input(s): PROBNP in the last 8760 hours.  CBG:  Recent Labs  Lab 02/26/17 0743  GLUCAP 91       Signed:  Rhetta MuraSAMTANI, JAI-GURMUKH MD   [facility] 03/03/2017, 9:16 AM

## 2017-03-03 NOTE — Clinical Social Work Placement (Signed)
Nurse to call report to [phone]   CLINICAL SOCIAL WORK PLACEMENT  NOTE  Date:  03/03/2017  Patient Details  Name: Daniel Brown MRN: [id number] Date of Birth: 09/29/1927  Clinical Social Work is seeking post-discharge placement for this patient at the Skilled  Nursing Facility level of care (*CSW will initial, date and re-position this form in  chart as items are completed):  Yes   Patient/family provided with [place] Clinical Social Work Department's list of facilities offering this level of care within the geographic area requested by the patient (or if unable, by the patient's family).  Yes   Patient/family informed of their freedom to choose among providers that offer the needed level of care, that participate in Medicare, Medicaid or managed care program needed by the patient, have an available bed and are willing to accept the patient.  Yes   Patient/family informed of [facility]'s ownership interest in [facility] and [facility], as well as of the fact that they are under no obligation to receive care at these facilities.  PASRR submitted to EDS on 03/02/17     PASRR number received on       Existing PASRR number confirmed on       FL2 transmitted to all facilities in geographic area requested by pt/family on       FL2 transmitted to all facilities within larger geographic area on 03/02/17     Patient informed that his/her managed care company has contracts with or will negotiate with certain facilities, including the following:        Yes   Patient/family informed of bed offers received.  [facility]     Physician recommends and patient chooses bed at      [facility] to [place] [facility] [place] [facility] 03[place]/17.  Patient to be transferred to facility by [facility]     Patient family notified on 03/03/17 of transfer.  Name of family member notified:  Rick     PHYSICIAN       Additional Comment:     _______________________________________________ Baldemar LenisElizabeth M Kody Brandl, LCSW 03/03/2017, 11:46 AM

## 2017-03-03 NOTE — Progress Notes (Signed)
Mady GemmaEugene P Palmieri to be D/C'd  per MD order.  VSS, Skin clean, dry and intact, with exception of skin abrasion on right knee and right ankle from fall prior to arrival on unit.  IV catheter discontinued intact. Site without signs and symptoms of complications. Dressing and pressure applied.  An After Visit Summary was printed and given to the [facility] transporters who [facility] [place].  Report given over the phone to [place] at [place].

## 2017-04-06 ENCOUNTER — Emergency Department (HOSPITAL_COMMUNITY): Payer: Medicare Other

## 2017-04-06 ENCOUNTER — Encounter (HOSPITAL_COMMUNITY): Payer: Self-pay | Admitting: Cardiology

## 2017-04-06 ENCOUNTER — Inpatient Hospital Stay (HOSPITAL_COMMUNITY)
Admission: EM | Admit: 2017-04-06 | Discharge: 2017-04-09 | DRG: 177 | Disposition: A | Discharge status: Skilled Nursing Facility | Payer: Medicare Other | Attending: Internal Medicine | Admitting: Internal Medicine

## 2017-04-06 DIAGNOSIS — Z7901 Long term (current) use of anticoagulants: Secondary | ICD-10-CM

## 2017-04-06 DIAGNOSIS — J449 Chronic obstructive pulmonary disease, unspecified: Secondary | ICD-10-CM | POA: Diagnosis present

## 2017-04-06 DIAGNOSIS — I480 Paroxysmal atrial fibrillation: Secondary | ICD-10-CM | POA: Diagnosis present

## 2017-04-06 DIAGNOSIS — R4182 Altered mental status, unspecified: Secondary | ICD-10-CM

## 2017-04-06 DIAGNOSIS — Z87891 Personal history of nicotine dependence: Secondary | ICD-10-CM | POA: Diagnosis not present

## 2017-04-06 DIAGNOSIS — L8990 Pressure ulcer of unspecified site, unspecified stage: Secondary | ICD-10-CM | POA: Diagnosis present

## 2017-04-06 DIAGNOSIS — R402362 Coma scale, best motor response, obeys commands, at arrival to emergency department: Secondary | ICD-10-CM | POA: Diagnosis present

## 2017-04-06 DIAGNOSIS — N179 Acute kidney failure, unspecified: Secondary | ICD-10-CM | POA: Diagnosis present

## 2017-04-06 DIAGNOSIS — I110 Hypertensive heart disease with heart failure: Secondary | ICD-10-CM | POA: Diagnosis present

## 2017-04-06 DIAGNOSIS — J9601 Acute respiratory failure with hypoxia: Secondary | ICD-10-CM | POA: Diagnosis present

## 2017-04-06 DIAGNOSIS — I10 Essential (primary) hypertension: Secondary | ICD-10-CM | POA: Diagnosis not present

## 2017-04-06 DIAGNOSIS — L89152 Pressure ulcer of sacral region, stage 2: Secondary | ICD-10-CM | POA: Diagnosis present

## 2017-04-06 DIAGNOSIS — E785 Hyperlipidemia, unspecified: Secondary | ICD-10-CM | POA: Diagnosis present

## 2017-04-06 DIAGNOSIS — Z66 Do not resuscitate: Secondary | ICD-10-CM | POA: Diagnosis present

## 2017-04-06 DIAGNOSIS — I482 Chronic atrial fibrillation: Secondary | ICD-10-CM | POA: Diagnosis present

## 2017-04-06 DIAGNOSIS — J9602 Acute respiratory failure with hypercapnia: Secondary | ICD-10-CM | POA: Diagnosis present

## 2017-04-06 DIAGNOSIS — Z950 Presence of cardiac pacemaker: Secondary | ICD-10-CM | POA: Diagnosis not present

## 2017-04-06 DIAGNOSIS — I959 Hypotension, unspecified: Secondary | ICD-10-CM | POA: Diagnosis present

## 2017-04-06 DIAGNOSIS — J690 Pneumonitis due to inhalation of food and vomit: Secondary | ICD-10-CM | POA: Diagnosis not present

## 2017-04-06 DIAGNOSIS — I255 Ischemic cardiomyopathy: Secondary | ICD-10-CM | POA: Diagnosis present

## 2017-04-06 DIAGNOSIS — R402142 Coma scale, eyes open, spontaneous, at arrival to emergency department: Secondary | ICD-10-CM | POA: Diagnosis present

## 2017-04-06 DIAGNOSIS — I340 Nonrheumatic mitral (valve) insufficiency: Secondary | ICD-10-CM | POA: Diagnosis not present

## 2017-04-06 DIAGNOSIS — G40909 Epilepsy, unspecified, not intractable, without status epilepticus: Secondary | ICD-10-CM | POA: Diagnosis present

## 2017-04-06 DIAGNOSIS — R402222 Coma scale, best verbal response, incomprehensible words, at arrival to emergency department: Secondary | ICD-10-CM | POA: Diagnosis present

## 2017-04-06 DIAGNOSIS — N183 Chronic kidney disease, stage 3 unspecified: Secondary | ICD-10-CM | POA: Diagnosis present

## 2017-04-06 DIAGNOSIS — I5021 Acute systolic (congestive) heart failure: Secondary | ICD-10-CM | POA: Diagnosis present

## 2017-04-06 DIAGNOSIS — L89102 Pressure ulcer of unspecified part of back, stage 2: Secondary | ICD-10-CM | POA: Diagnosis present

## 2017-04-06 DIAGNOSIS — I2510 Atherosclerotic heart disease of native coronary artery without angina pectoris: Secondary | ICD-10-CM | POA: Diagnosis present

## 2017-04-06 DIAGNOSIS — Z8673 Personal history of transient ischemic attack (TIA), and cerebral infarction without residual deficits: Secondary | ICD-10-CM | POA: Diagnosis not present

## 2017-04-06 DIAGNOSIS — J9622 Acute and chronic respiratory failure with hypercapnia: Secondary | ICD-10-CM | POA: Diagnosis present

## 2017-04-06 DIAGNOSIS — I509 Heart failure, unspecified: Secondary | ICD-10-CM

## 2017-04-06 DIAGNOSIS — Z9049 Acquired absence of other specified parts of digestive tract: Secondary | ICD-10-CM

## 2017-04-06 LAB — URINALYSIS, ROUTINE W REFLEX MICROSCOPIC
BACTERIA UA: NONE SEEN
BILIRUBIN URINE: NEGATIVE
Glucose, UA: NEGATIVE mg/dL
Ketones, ur: NEGATIVE mg/dL
LEUKOCYTES UA: NEGATIVE
Nitrite: NEGATIVE
PROTEIN: NEGATIVE mg/dL
SQUAMOUS EPITHELIAL / LPF: NONE SEEN
Specific Gravity, Urine: 1.017 (ref 1.005–1.030)
pH: 5 (ref 5.0–8.0)

## 2017-04-06 LAB — BLOOD GAS, ARTERIAL
ACID-BASE EXCESS: 0.9 mmol/L (ref 0.0–2.0)
Bicarbonate: 24.1 mmol/L (ref 20.0–28.0)
Drawn by: 22223
O2 Content: 2.5 L/min
O2 SAT: 97.9 %
PCO2 ART: 54.2 mmHg — AB (ref 32.0–48.0)
PO2 ART: 129 mmHg — AB (ref 83.0–108.0)
pH, Arterial: 7.31 — ABNORMAL LOW (ref 7.350–7.450)

## 2017-04-06 LAB — I-STAT CHEM 8, ED
BUN: 42 mg/dL — AB (ref 6–20)
CALCIUM ION: 1.13 mmol/L — AB (ref 1.15–1.40)
CREATININE: 1.6 mg/dL — AB (ref 0.61–1.24)
Chloride: 105 mmol/L (ref 101–111)
Glucose, Bld: 98 mg/dL (ref 65–99)
HCT: 48 % (ref 39.0–52.0)
Hemoglobin: 16.3 g/dL (ref 13.0–17.0)
Potassium: 3.8 mmol/L (ref 3.5–5.1)
Sodium: 145 mmol/L (ref 135–145)
TCO2: 27 mmol/L (ref 22–32)

## 2017-04-06 LAB — HEPATIC FUNCTION PANEL
ALK PHOS: 77 U/L (ref 38–126)
ALT: 18 U/L (ref 17–63)
AST: 29 U/L (ref 15–41)
Albumin: 3.2 g/dL — ABNORMAL LOW (ref 3.5–5.0)
BILIRUBIN DIRECT: 0.4 mg/dL (ref 0.1–0.5)
BILIRUBIN TOTAL: 1.3 mg/dL — AB (ref 0.3–1.2)
Indirect Bilirubin: 0.9 mg/dL (ref 0.3–0.9)
Total Protein: 6.5 g/dL (ref 6.5–8.1)

## 2017-04-06 LAB — I-STAT CG4 LACTIC ACID, ED
LACTIC ACID, VENOUS: 3.31 mmol/L — AB (ref 0.5–1.9)
Lactic Acid, Venous: 2.03 mmol/L (ref 0.5–1.9)

## 2017-04-06 LAB — CBC WITH DIFFERENTIAL/PLATELET
BASOS PCT: 0 %
Basophils Absolute: 0 10*3/uL (ref 0.0–0.1)
EOS ABS: 0 10*3/uL (ref 0.0–0.7)
EOS PCT: 0 %
HEMATOCRIT: 53.1 % — AB (ref 39.0–52.0)
Hemoglobin: 16.6 g/dL (ref 13.0–17.0)
Lymphocytes Relative: 12 %
Lymphs Abs: 1.1 10*3/uL (ref 0.7–4.0)
MCH: 31.6 pg (ref 26.0–34.0)
MCHC: 31.3 g/dL (ref 30.0–36.0)
MCV: 101.1 fL — AB (ref 78.0–100.0)
MONOS PCT: 10 %
Monocytes Absolute: 1 10*3/uL (ref 0.1–1.0)
NEUTROS PCT: 78 %
Neutro Abs: 7.3 10*3/uL (ref 1.7–7.7)
Platelets: 151 10*3/uL (ref 150–400)
RBC: 5.25 MIL/uL (ref 4.22–5.81)
RDW: 15.7 % — ABNORMAL HIGH (ref 11.5–15.5)
WBC: 9.4 10*3/uL (ref 4.0–10.5)

## 2017-04-06 LAB — AMMONIA: Ammonia: 21 umol/L (ref 9–35)

## 2017-04-06 LAB — I-STAT TROPONIN, ED: Troponin i, poc: 0.07 ng/mL (ref 0.00–0.08)

## 2017-04-06 LAB — BRAIN NATRIURETIC PEPTIDE: B Natriuretic Peptide: 1237 pg/mL — ABNORMAL HIGH (ref 0.0–100.0)

## 2017-04-06 LAB — MAGNESIUM: Magnesium: 2 mg/dL (ref 1.7–2.4)

## 2017-04-06 MED ORDER — LEVETIRACETAM IN NACL 500 MG/100ML IV SOLN
500.0000 mg | INTRAVENOUS | Status: DC
  Administered 2017-04-07: 500 mg via INTRAVENOUS
  Filled 2017-04-06: qty 100

## 2017-04-06 MED ORDER — SODIUM CHLORIDE 0.9 % IV SOLN
3.0000 g | Freq: Four times a day (QID) | INTRAVENOUS | Status: DC
  Administered 2017-04-07 – 2017-04-09 (×10): 3 g via INTRAVENOUS
  Filled 2017-04-06 (×18): qty 3

## 2017-04-06 MED ORDER — ONDANSETRON HCL 4 MG PO TABS: 4.0000 mg | ORAL_TABLET | Freq: Four times a day (QID) | ORAL | Status: DC | PRN

## 2017-04-06 MED ORDER — SODIUM CHLORIDE 0.9 % IV BOLUS (SEPSIS)
1000.0000 mL | Freq: Once | INTRAVENOUS | Status: AC
  Administered 2017-04-06: 1000 mL via INTRAVENOUS

## 2017-04-06 MED ORDER — ACETAMINOPHEN 325 MG PO TABS: 650.0000 mg | ORAL_TABLET | Freq: Four times a day (QID) | ORAL | Status: DC | PRN

## 2017-04-06 MED ORDER — ACETAMINOPHEN 650 MG RE SUPP
650.0000 mg | Freq: Four times a day (QID) | RECTAL | Status: DC | PRN
Start: 2017-04-06 — End: 2017-04-09

## 2017-04-06 MED ORDER — FUROSEMIDE 10 MG/ML IJ SOLN
20.0000 mg | Freq: Once | INTRAMUSCULAR | Status: AC
  Administered 2017-04-07: 20 mg via INTRAVENOUS
  Filled 2017-04-06: qty 2

## 2017-04-06 MED ORDER — ONDANSETRON HCL 4 MG/2ML IJ SOLN: 4.0000 mg | Freq: Four times a day (QID) | INTRAMUSCULAR | Status: DC | PRN

## 2017-04-06 NOTE — ED Provider Notes (Signed)
[facility]   CSN: [id number] Arrival date & time: 04/06/17  1557     History   Chief Complaint Chief Complaint  Patient presents with  . Altered Mental Status    HPI Daniel Brown is a 83 y.o. male.  Patient sent here from his nursing care facility for altered mental status associated with abnormal chest x-ray.  Apparently a portable chest x-ray was done at his facility which showed "CHF and right-sided pleural effusion."  Those images and records are not available to review.  The patient is unable to give any history.  He was recently admitted, to: Hospital, where he had a sphincterotomy for retained stone, and treatment for injuries from fall, rhabdomyolysis, acute kidney failure and UTI.  Patient is DNR.  He has a most form.  Level 5 caveat-altered mental status   HPI  Past Medical History:  Diagnosis Date  . CAD (coronary artery disease)    by cath 1/11, medical management advised  . Cardiomyopathy, ischemic    EF 40% by echo 1/11  . Complete heart block (HCC)    s/p SJM PPM by [facility] 01/10/11  . COPD (chronic obstructive pulmonary disease) (HCC)   . CVD (cardiovascular disease)    s/p MCA stroke 1/11  . HTN (hypertension)   . Hyperlipidemia   . Permanent atrial fibrillation (HCC)   . Seizure disorder (HCC)   . Stroke [facility](HCC)     Patient Active Problem List   Diagnosis Date Noted  . Pressure injury of skin 02/24/2017  . Non-traumatic rhabdomyolysis   . AKI (acute kidney injury) (HCC)   . Choledocholithiasis 02/23/2017  . ARF (acute renal failure) (HCC) 02/23/2017  . UTI (urinary tract infection) 02/23/2017  . Elevated troponin 02/23/2017  . Paroxysmal atrial fibrillation (HCC) 10/17/2015  . Therapeutic drug monitoring 10/17/2015  . Pacemaker-St.Jude 02/24/2012  . Complete heart block (HCC) 01/20/2011  . Chronic systolic dysfunction of left ventricle 09/23/2010  . Long term current use of anticoagulant 07/30/2010  .  Coronary atherosclerosis 07/04/2009  . HYPERLIPIDEMIA 07/03/2009  . Essential hypertension 07/03/2009  . Atrial fibrillation (HCC) 07/03/2009  . CHF 07/03/2009  . COPD 07/03/2009  . SEIZURE DISORDER 07/03/2009    Past Surgical History:  Procedure Laterality Date  . CHOLECYSTECTOMY    . ERCP N/A 02/26/2017   Procedure: ENDOSCOPIC RETROGRADE CHOLANGIOPANCREATOGRAPHY (ERCP);  Surgeon: Vida RiggerMagod, Marc, MD;  [facility];  Service: Endoscopy;  Laterality: N/A;  . PACEMAKER INSERTION  01/10/11   by Fawn KirkJA for [facility]       Home Medications    Prior to Admission medications   Medication Sig Start Date End Date Taking? Authorizing Provider  carvedilol (COREG) 3.125 MG tablet Take 3.125 mg by mouth 2 (two) times daily.    Yes [provider]  Cholecalciferol (VITAMIN D) 2000 UNITS CAPS Take 2,000 Units by mouth daily.    Yes [provider]  ezetimibe (ZETIA) 10 MG tablet Take 10 mg by mouth daily.   Yes [provider]  furosemide (LASIX) 40 MG tablet Take 20 mg by mouth 3 (three) times a week. Monday, Wednesday and Friday   Yes [provider]  isosorbide mononitrate (IMDUR) 30 MG 24 hr tablet Take 30 mg by mouth daily.    Yes [provider]  levETIRAcetam (KEPPRA) 250 MG tablet Take 250 mg by mouth 2 (two) times daily.    Yes [provider]  lisinopril (PRINIVIL,ZESTRIL) 2.5 MG tablet Take 1 tablet (2.5 mg  total) by mouth daily. 01/13/17  Yes Allred, Fayrene Fearing, MD  NITROSTAT 0.4 MG SL tablet Place 0.4 mg under the tongue as needed for chest pain.  01/17/11  Yes [provider]  XARELTO 20 MG TABS tablet TAKE 1 TABLET BY MOUTH  DAILY 08/25/16  Yes Allred, Fayrene Fearing, MD    Family History Family History  Problem Relation Age of Onset  . Colon cancer Neg Hx     Social History Social History   Tobacco Use  . Smoking status: Former Games developer  . Smokeless tobacco: Never Used  . Tobacco comment: quit 40 years ago  Substance Use Topics    . Alcohol use: No    Alcohol/week: 0.0 oz  . Drug use: No     Allergies   Patient has no known allergies.   Review of Systems Review of Systems  Unable to perform ROS: Mental status change     Physical Exam Updated Vital Signs BP 138/68   Pulse 62   Temp 98.4 F (36.9 C) (Rectal)   Resp 18   Ht 6' (1.829 m)   Wt 75.8 kg (167 lb)   SpO2 100%   BMI 22.65 kg/m   Physical Exam  Constitutional: He appears well-developed. He appears distressed (Uncomfortable, vomited on his shirt).  Lethargic, elderly, frail  HENT:  Head: Normocephalic and atraumatic.  Right Ear: External ear normal.  Left Ear: External ear normal.  Oral mucous membranes very dry with food material present on them.  Eyes: Conjunctivae and EOM are normal. Pupils are equal, round, and reactive to light. Right eye exhibits no discharge. Left eye exhibits no discharge.  Neck: Normal range of motion and phonation normal. Neck supple.  Cardiovascular: Normal rate, regular rhythm and normal heart sounds.  Pulmonary/Chest: Effort normal and breath sounds normal. He exhibits no bony tenderness.  Abdominal: Soft. There is no tenderness. There is no guarding.  Musculoskeletal: He exhibits no edema or deformity.  Neurological: No cranial nerve deficit. GCS eye subscore is 4. GCS verbal subscore is 2. GCS motor subscore is 6.  Lethargic, minimally following commands to grip or raise arms.  Skin: Skin is warm, dry and intact.  Psychiatric:  Obtunded  Nursing note and vitals reviewed.    ED Treatments / Results  Labs (all labs ordered are listed, but only abnormal results are displayed) Labs Reviewed  HEPATIC FUNCTION PANEL - Abnormal; Notable for the following components:      Result Value   Albumin 3.2 (*)    Total Bilirubin 1.3 (*)    All other components within normal limits  CBC WITH DIFFERENTIAL/PLATELET - Abnormal; Notable for the following components:   HCT 53.1 (*)    MCV 101.1 (*)    RDW 15.7  (*)    All other components within normal limits  URINALYSIS, ROUTINE W REFLEX MICROSCOPIC - Abnormal; Notable for the following components:   Hgb urine dipstick MODERATE (*)    All other components within normal limits  BLOOD GAS, ARTERIAL - Abnormal; Notable for the following components:   pH, Arterial 7.310 (*)    pCO2 arterial 54.2 (*)    pO2, Arterial 129.0 (*)    All other components within normal limits  I-STAT CHEM 8, ED - Abnormal; Notable for the following components:   BUN 42 (*)    Creatinine, Ser 1.60 (*)    Calcium, Ion 1.13 (*)    All other components within normal limits  I-STAT CG4 LACTIC ACID, ED - Abnormal; Notable for  the following components:   Lactic Acid, Venous 3.31 (*)    All other components within normal limits  I-STAT CG4 LACTIC ACID, ED - Abnormal; Notable for the following components:   Lactic Acid, Venous 2.03 (*)    All other components within normal limits  URINE CULTURE  AMMONIA  I-STAT TROPONIN, ED    EKG  EKG Interpretation  Date/Time:  Monday April 06 2017 16:02:18 EST Ventricular Rate:  72 PR Interval:    QRS Duration: 170 QT Interval:  481 QTC Calculation: 527 R Axis:   -69 Text Interpretation:  Ventricular-paced rhythm No further analysis attempted due to paced rhythm since last tracing no significant change Confirmed by Mancel Bale [phone]) on 04/06/2017 4:13:49 PM       Radiology Ct Head Wo Contrast  Result Date: 04/06/2017 CLINICAL DATA:  Chief eighth mental status changes. EXAM: CT HEAD WITHOUT CONTRAST TECHNIQUE: Contiguous axial images were obtained from the base of the skull through the vertex without intravenous contrast. COMPARISON:  02/23/2017 FINDINGS: Brain: There is no evidence for acute hemorrhage, hydrocephalus, mass lesion, or abnormal extra-axial fluid collection. No definite CT evidence for acute infarction. Diffuse loss of parenchymal volume is consistent with atrophy. Patchy low attenuation in the deep  hemispheric and periventricular white matter is nonspecific, but likely reflects chronic microvascular ischemic demyelination. Old left MCA territory infarct again noted. Vascular: No hyperdense vessel or unexpected calcification. Skull: No evidence for fracture. No worrisome lytic or sclerotic lesion. Sinuses/Orbits: The visualized paranasal sinuses and mastoid air cells are clear. Visualized portions of the globes and intraorbital fat are unremarkable. Other: None. IMPRESSION: 1. Stable exam.  No acute intracranial abnormality. 2. Atrophy with chronic small vessel white matter ischemic disease. 3. Old left MCA territory infarct. Electronically Signed   By: Kennith Center M.D.   On: 04/06/2017 20:10   Dg Chest Port 1 View  Result Date: 04/06/2017 CLINICAL DATA:  Altered mental status and wheezing. EXAM: PORTABLE CHEST 1 VIEW COMPARISON:  02/23/2017 FINDINGS: The cardio pericardial silhouette is enlarged. Vascular congestion with pulmonary edema noted. There is bibasilar atelectasis or pneumonia with small bilateral pleural effusions. Left permanent pacemaker again noted. Bones are diffusely demineralized. Telemetry leads overlie the chest. IMPRESSION: Cardiomegaly with pulmonary edema and basilar opacity likely atelectasis although pneumonia not excluded. Small bilateral pleural effusions. Electronically Signed   By: Kennith Center M.D.   On: 04/06/2017 17:04    Procedures .Critical Care Performed by: Mancel Bale, MD Authorized by: Mancel Bale, MD   Critical care provider statement:    Critical care time (minutes):  45   Critical care start time:  04/06/2017 4:30 PM   Critical care end time:  04/06/2017 9:40 PM   Critical care time was exclusive of:  Separately billable procedures and treating other patients   Critical care was necessary to treat or prevent imminent or life-threatening deterioration of the following conditions:  Respiratory failure and renal failure   Critical care was time  spent personally by me on the following activities:  Blood draw for specimens, development of treatment plan with patient or surrogate, evaluation of patient's response to treatment, discussions with consultants, examination of patient, ordering and performing treatments and interventions, ordering and review of laboratory studies, ordering and review of radiographic studies, pulse oximetry, re-evaluation of patient's condition and review of old charts   I assumed direction of critical care for this patient from another provider in my specialty: no     (including critical care time)  Medications  Ordered in ED Medications  sodium chloride 0.9 % bolus 1,000 mL (0 mLs Intravenous Stopped 04/06/17 1826)     Initial Impression / Assessment and Plan / ED Course  I have reviewed the triage vital signs and the nursing notes.  Pertinent labs & imaging results that were available during my care of the patient were reviewed by me and considered in my medical decision making (see chart for details).  Clinical Course as of Apr 06 2140  Mon Apr 06, 2017  1851 Improved from prior indicating fluid responsiveness.  Near normal Lactic Acid, Venous: (!!) 2.03 [EW]  1851 Elevated, higher than baseline Creatinine: (!) 1.60 [EW]  1851 Elevated BUN: (!) 42 [EW]  1852 Normal WBC: 9.4 [EW]  1852 Mild elevation RBC / HPF: 6-30 [EW]  1852 Normal WBC, UA: 0-5 [EW]  1852 Normal Ammonia: 21 [EW]  1852 Abnormal with cardiomegaly, and congestive heart failure DG Chest Port 1 View [EW]    Clinical Course User Index [EW] Mancel BaleWentz, Donivin Wirt, MD     Patient Vitals for the past 24 hrs:  BP Temp Temp src Pulse Resp SpO2 Height Weight  04/06/17 2100 138/68 - - 62 18 100 % - -  04/06/17 2000 113/83 - - (!) 51 (!) 21 100 % - -  04/06/17 1930 115/76 - - (!) 58 (!) 23 100 % - -  04/06/17 1900 114/86 - - 68 15 100 % - -  04/06/17 1830 111/79 - - 60 13 100 % - -  04/06/17 1800 115/68 - - (!) 59 18 100 % - -  04/06/17 1730  (!) 112/91 - - 70 (!) 33 100 % - -  04/06/17 1700 108/85 - - (!) 59 18 100 % - -  04/06/17 1630 107/72 98.4 F (36.9 C) Rectal 66 (!) 29 98 % - -  04/06/17 1615 107/68 - - 64 18 100 % - -  04/06/17 1602 107/86 98.1 F (36.7 C) Axillary 76 18 100 % - -  04/06/17 1600 - - - - - - 6' (1.829 m) 75.8 kg (167 lb)    9:41 PM Reevaluation with update and discussion. After initial assessment and treatment, an updated evaluation reveals clinical status unchanged, he continues to be obtunded.Mancel Bale. Morgann Woodburn    9:43 PM-Consult complete with hospitalist. Patient case explained and discussed.  He agrees to admit patient for further evaluation and treatment. Call ended at 10: 00  Final Clinical Impressions(s) / ED Diagnoses   Final diagnoses:  Altered mental status, unspecified altered mental status type  Congestive heart failure, unspecified HF chronicity, unspecified heart failure type (HCC)  Acute respiratory failure with hypoxia and hypercapnia (HCC)  AKI (acute kidney injury) (HCC)   Nonspecific altered mental status, with apparent new onset pulmonary edema.  Last cardiac echo 2012 showed mild heart failure.  Patient with evident vomiting today, may have aspirated.  No clear cause for altered mental status.  No respiratory distress.  The patient is DNR/DNI  Nursing Notes Reviewed/ Care Coordinated Applicable Imaging Reviewed Interpretation of Laboratory Data incorporated into ED treatment  Plan: Admit   ED Discharge Orders    None       Mancel BaleWentz, Hazely Sealey, MD 04/06/17 2208

## 2017-04-06 NOTE — ED Notes (Signed)
Respiratory paged for ABG.  

## 2017-04-06 NOTE — H&P (Addendum)
History and Physical    Daniel Brown WUJ:[id number] DOB: 27-Jan-1928 DOA: 04/06/2017  PCP: [name], MD (Inactive)   Patient coming from: [facility] facility.  I have personally briefly reviewed patient's old medical records in [facility]  Chief Complaint: Altered mental status.  HPI: Daniel Brown is a 83 y.o. male with medical history significant of CAD on medical management only, ischemic cardiomyopathy, atrial fibrillation, complete heart block, pacemaker placement, history of MCA stroke, hypertension, hyperlipidemia, COPD, seizure disorder who was transferred from the nursing home due to altered mental status.  A chest radiograph was performed at the facility which apparently showed "CHF and right-sided pleural effusion"per facility's transfer documents.  The patient was found having emesis contents on his clothes.  He is unable to provide further history.  ED Course: Initial vital signs temperature 36.7C, pulse 76, respirations 18, blood pressure 107/86 mmHg and O2 sat 100% on room air.  The patient was given 1 L NS bolus in the emergency department.  He has required supplemental nasal cannula oxygen to maintain a normal O2 saturation.  His workup shows an urinalysis with moderate hemoglobinuria (6-30 RBC and microscopic), but otherwise unremarkable.  A sodium of 145, potassium 3.8, chloride 1.05, BUN 42, creatinine 1.60, glucose 98 and CO2 27.  His CBC shows a white count of 9.4 with a normal differential, hemoglobin 16.6 g/dL and platelets of 956.  Initial troponin was normal.  His LFTs show a mildly decreased albumin at 3.2 g/dL and total bilirubin 1.3 mg/dL.  Initial lactic acid was 3.31, but after IV bolus was 2.03 mmol/L.  Review of Systems: Unable to obtain due to the patient's mental status.  Past Medical History:  Diagnosis Date  . CAD (coronary artery disease)    by cath 1/11, medical management advised  . Cardiomyopathy, ischemic    EF 40% by echo 1/11    . Complete heart block (HCC)    s/p SJM PPM by JSA 01/10/11  . COPD (chronic obstructive pulmonary disease) (HCC)   . CVD (cardiovascular disease)    s/p MCA stroke 1/11  . HTN (hypertension)   . Hyperlipidemia   . Permanent atrial fibrillation (HCC)   . Seizure disorder (HCC)   . Stroke [facility])     Past Surgical History:  Procedure Laterality Date  . CHOLECYSTECTOMY    . ERCP N/A 02/26/2017   Procedure: ENDOSCOPIC RETROGRADE CHOLANGIOPANCREATOGRAPHY (ERCP);  Surgeon: Vida Rigger, MD;  Location: [facility] ENDOSCOPY;  Service: Endoscopy;  Laterality: N/A;  . PACEMAKER INSERTION  01/10/11   by Fawn Kirk for CHB     reports that he has quit smoking. he has never used smokeless tobacco. He reports that he does not drink alcohol or use drugs.  No Known Allergies  Family History  Problem Relation Age of Onset  . Colon cancer Neg Hx   Unable to obtain further family history due to the patient's mental status.   Prior to Admission medications   Medication Sig Start Date End Date Taking? Authorizing Provider  carvedilol (COREG) 3.125 MG tablet Take 3.125 mg by mouth 2 (two) times daily.    Yes [provider]  Cholecalciferol (VITAMIN D) 2000 UNITS CAPS Take 2,000 Units by mouth daily.    Yes [provider]  ezetimibe (ZETIA) 10 MG tablet Take 10 mg by mouth daily.   Yes [provider]  furosemide (LASIX) 40 MG tablet Take 20 mg by mouth 3 (three) times a week.  Monday, Wednesday and Friday   Yes [provider]  isosorbide mononitrate (IMDUR) 30 MG 24 hr tablet Take 30 mg by mouth daily.    Yes [provider]  levETIRAcetam (KEPPRA) 250 MG tablet Take 250 mg by mouth 2 (two) times daily.    Yes [provider]  lisinopril (PRINIVIL,ZESTRIL) 2.5 MG tablet Take 1 tablet (2.5 mg total) by mouth daily. 01/13/17  Yes Allred, Fayrene FearingJames, MD  NITROSTAT 0.4 MG SL tablet Place 0.4 mg under the tongue as needed for chest pain.  01/17/11  Yes [provider]  XARELTO 20 MG TABS tablet TAKE 1 TABLET BY MOUTH  DAILY 08/25/16  Yes Hillis RangeAllred, James, MD    Physical Exam: Vitals:   04/06/17 2130 04/06/17 2200 04/06/17 2246 04/06/17 2252  BP: 95/65 98/70 113/74   Pulse: 60 (!) 59 65   Resp: 17 15 (!) 30   Temp:    (!) 97.4 F (36.3 C)  TempSrc:    Axillary  SpO2: 100% 98% 90%   Weight:    86.8 kg (191 lb 5.8 oz)  Height:    6' (1.829 m)    Constitutional: Obtunded, wakes up briefly with tactile stimuli.  Does not respond to verbal stimuli. Eyes: PERRL, left eye eyelid shows a hyperpigmented lesion.  Conjunctiva is not injected. ENMT: Mucous membranes are moist.  Positive oral thrush on buccal mucosa and pharynx. Dentures. Neck: normal, supple, no masses, no thyromegaly Respiratory: Decreased breath sounds and positive rales on bases.  Cardiovascular: Bradycardic at 57 bpm., no murmurs / rubs / gallops.  3+ lower extremity edema. 2+ pedal pulses. No carotid bruits.  Abdomen: Soft, no tenderness, no masses palpated. No hepatosplenomegaly. Bowel sounds positive.  Musculoskeletal: no clubbing / cyanosis. Good ROM, no contractures. Normal muscle tone.  Skin: Positive pressure injuries on sacral and lower thoracic areas.  Hypopigmented macules and patches on back and posterior tights. Neurologic: Obtunded.  Moves all extremities.  Unable to fully evaluate.  Psychiatric: Obtunded.             Labs on Admission: I have personally reviewed following labs and imaging studies  CBC: Recent Labs  Lab 04/06/17 1615 04/06/17 1629  WBC 9.4  --   NEUTROABS 7.3  --   HGB 16.6 16.3  HCT 53.1* 48.0  MCV 101.1*  --   PLT 151  --    Basic Metabolic Panel: Recent Labs  Lab 04/06/17 1629 04/06/17 1900  NA 145  --   K 3.8  --   CL 105  --   GLUCOSE 98  --   BUN 42*  --   CREATININE 1.60*  --   MG  --  2.0   GFR: Estimated Creatinine Clearance: 35.7 mL/min (A) (by C-G formula based on SCr of 1.6 mg/dL (H)). Liver Function  Tests: Recent Labs  Lab 04/06/17 1615  AST 29  ALT 18  ALKPHOS 77  BILITOT 1.3*  PROT 6.5  ALBUMIN 3.2*   No results for input(s): LIPASE, AMYLASE in the last 168 hours. Recent Labs  Lab 04/06/17 1615  AMMONIA 21   Coagulation Profile: No results for input(s): INR, PROTIME in the last 168 hours. Cardiac Enzymes: No results for input(s): CKTOTAL, CKMB, CKMBINDEX, TROPONINI in the last 168 hours. BNP (last 3 results) No results for input(s): PROBNP in the last 8760 hours. HbA1C: No results for input(s): HGBA1C in the last 72 hours. CBG: No results for input(s): GLUCAP in the last 168 hours. Lipid  Profile: No results for input(s): CHOL, HDL, LDLCALC, TRIG, CHOLHDL, LDLDIRECT in the last 72 hours. Thyroid Function Tests: No results for input(s): TSH, T4TOTAL, FREET4, T3FREE, THYROIDAB in the last 72 hours. Anemia Panel: No results for input(s): VITAMINB12, FOLATE, FERRITIN, TIBC, IRON, RETICCTPCT in the last 72 hours. Urine analysis:    Component Value Date/Time   COLORURINE YELLOW 04/06/2017 1630   APPEARANCEUR CLEAR 04/06/2017 1630   LABSPEC 1.017 04/06/2017 1630   PHURINE 5.0 04/06/2017 1630   GLUCOSEU NEGATIVE 04/06/2017 1630   HGBUR MODERATE (A) 04/06/2017 1630   BILIRUBINUR NEGATIVE 04/06/2017 1630   KETONESUR NEGATIVE 04/06/2017 1630   PROTEINUR NEGATIVE 04/06/2017 1630   UROBILINOGEN 0.2 09/13/2012 1958   NITRITE NEGATIVE 04/06/2017 1630   LEUKOCYTESUR NEGATIVE 04/06/2017 1630    Radiological Exams on Admission: Ct Head Wo Contrast  Result Date: 04/06/2017 CLINICAL DATA:  Chief eighth mental status changes. EXAM: CT HEAD WITHOUT CONTRAST TECHNIQUE: Contiguous axial images were obtained from the base of the skull through the vertex without intravenous contrast. COMPARISON:  02/23/2017 FINDINGS: Brain: There is no evidence for acute hemorrhage, hydrocephalus, mass lesion, or abnormal extra-axial fluid collection. No definite CT evidence for acute infarction.  Diffuse loss of parenchymal volume is consistent with atrophy. Patchy low attenuation in the deep hemispheric and periventricular white matter is nonspecific, but likely reflects chronic microvascular ischemic demyelination. Old left MCA territory infarct again noted. Vascular: No hyperdense vessel or unexpected calcification. Skull: No evidence for fracture. No worrisome lytic or sclerotic lesion. Sinuses/Orbits: The visualized paranasal sinuses and mastoid air cells are clear. Visualized portions of the globes and intraorbital fat are unremarkable. Other: None. IMPRESSION: 1. Stable exam.  No acute intracranial abnormality. 2. Atrophy with chronic small vessel white matter ischemic disease. 3. Old left MCA territory infarct. Electronically Signed   By: Eric  Mansell M.D.   On: 04/06/2017 20:10   Dg Chest Port 1 View  Result Date: 04/06/2017 CLINICAL DATA:  Altered mental status and wheezing. EXAM: PORTABLE CHEST 1 VIEW COMPARISON:  02/23/2017 FINDINGS: The cardio pericardial silhouette is enlarged. Vascular congestion with pulmonary edema noted. There is bibasilar atelectasis or pneumonia with small bilateral pleural effusions. Left permanent pacemaker again noted. Bones are diffusely demineralized. Telemetry leads overlie the chest. IMPRESSION: Cardiomegaly with pulmonary edema and basilar opacity likely atelectasis although pneumonia not excluded. Small bilateral pleural effusions. Electronically Signed   By: Eric  Mansell M.D.   On: 04/06/2017 17:13 Sep 2010 echocardiogram -------------------------------------------------------------------- Indications:  CAD of native vessels 414.01.  -------------------------------------------------------------------- History: PMH: Acquired from the patient and from the patient's chart. Atrial fibrillation. Coronary artery disease. Congestive heart failure. Stroke. Chronic obstructive pulmonary disease. Risk factors: Hypertension.  Dyslipidemia.  -------------------------------------------------------------------- Study Conclusions  - Left ventricle: Septal and apical hypokinesis The cavity size was mildly dilated. There was mild focal basal hypertrophy of the septum. Systolic function was mildly reduced. The estimated ejection fraction was in the range of 45% to 50%. - Aortic valve: Moderately calcified annulus. - Mitral valve: Calcified annulus. Mild regurgitation. - Left atrium: The atrium was moderately dilated. - Atrial septum: No defect or patent foramen ovale was identified. - Pulmonary arteries: PA peak pressure: 268mmm Hg (S). Transthoracic echocardiography. M-mode, complete 2D, spectral Doppler, and color Doppler. Height: Height: 175.3cm. Height: 69in. Weight: Weight: 82.1kg. Weight: 180.6lb. Body mass index: BMI: 26.7kg/m^2. Body surface area: BSA: 1.48m^2. Blood pressure: 109/63.  EKG: Independently reviewed. Vent. rate 72 BPM PR interval * ms QRS duration 170 ms QT/QTc 481/527 ms P-R-T  axes 0 -69 132  Assessment/Plan Principal Problem:   Acute respiratory failure with hypercapnia (HCC) Admit to stepdown/inpatient. Continue supplemental oxygen. Continue scheduled and as needed bronchodilators. BiPAP ventilation as needed. Continue aspiration pneumonia and CHF treatment.  Active Problems:   Aspiration pneumonia due to vomit (HCC) Hypoxic with AMS.  Found to have vomit on his clothes. Continue supplemental oxygen. Start Unasyn 3 g IVPB every 6 hours. Check strep pneumoniae urinary antigen. Continue bronchodilators.    Acute systolic congestive heart failure (HCC) Patient has 3+ lower extremity pitting edema.   Cardiomegaly and vascular congestion on chest x-ray However, lactic acid was elevated and improve after NS 1000 mL bolus was given earlier. Will give single dose of furosemide 20 mg IVP. Monitor intake and output. Holding ACE inhibitor due to AKI. Beta-blocker and  long-acting nitrates held due to hypotension  Check echocardiogram in the morning.    AKI (acute kidney injury) (HCC) Likely due to decreased oral intake, use of diuretics/ACE inhibitor Hold oral furosemide and hold lisinopril. Monitor intake and output. Follow-up renal function and electrolytes. Consider renal imaging and nephrology evaluation if renal function parameters worsen.    Hyperlipidemia Resume statin once more alert to swallow pills.    Essential hypertension Holding carvedilol due to earlier soft BPs. Lisinopril will be held due to elevated creatinine.    Coronary atherosclerosis Resume carvedilol, Xarelto and Zetia once more alert.    COPD (chronic obstructive pulmonary disease) (HCC) Continue supplemental oxygen. Continue scheduled and as needed bronchodilators.    Paroxysmal atrial fibrillation (HCC) CHA?DS?-VASc Score of 7. Resume Xarelto and carvedilol once the patient is more alert. Otherwise, may need to switch to parenteral anticoagulation and beta-blockers.    Stage 2 pressure injury of skin Of sacral area and lower thoracic areas Continue local care and further preventive measures.    DVT prophylaxis: On full dose anticoagulation with Xarelto Code Status: DNR/DNI. Family Communication:  Disposition Plan: Admit for acute respiratory failure, aspiration pneumonia, systolic CHF Consults called:  Admission status: Inpatient/stepdown.   Bobette Mo MD [facility] Pager [phone].  If 7PM-7AM, please contact night-coverage [email or web address] Password [facility]  04/06/2017, 11:05 PM

## 2017-04-06 NOTE — ED Notes (Signed)
Pt resting with eyes closed, appears to be in no distress. Respirations are even and unlabored.  

## 2017-04-06 NOTE — ED Triage Notes (Signed)
Pt from [facility].  Per staff pt has not been as alert today.  Pt has had a cough also.  Came over for an out patient c xray today.  And showed right pleural effusion and CHF.

## 2017-04-06 NOTE — ED Notes (Signed)
Call pt's family with d/c or admission information [name] [phone] [address] [phone]

## 2017-04-06 NOTE — ED Notes (Signed)
Report given to [place], RN AP ICU.

## 2017-04-07 ENCOUNTER — Inpatient Hospital Stay (HOSPITAL_COMMUNITY): Payer: Medicare Other

## 2017-04-07 DIAGNOSIS — I340 Nonrheumatic mitral (valve) insufficiency: Secondary | ICD-10-CM

## 2017-04-07 DIAGNOSIS — I480 Paroxysmal atrial fibrillation: Secondary | ICD-10-CM

## 2017-04-07 DIAGNOSIS — I5021 Acute systolic (congestive) heart failure: Secondary | ICD-10-CM

## 2017-04-07 DIAGNOSIS — J449 Chronic obstructive pulmonary disease, unspecified: Secondary | ICD-10-CM

## 2017-04-07 DIAGNOSIS — N179 Acute kidney failure, unspecified: Secondary | ICD-10-CM

## 2017-04-07 DIAGNOSIS — E785 Hyperlipidemia, unspecified: Secondary | ICD-10-CM

## 2017-04-07 DIAGNOSIS — J690 Pneumonitis due to inhalation of food and vomit: Principal | ICD-10-CM

## 2017-04-07 DIAGNOSIS — I2510 Atherosclerotic heart disease of native coronary artery without angina pectoris: Secondary | ICD-10-CM

## 2017-04-07 LAB — ECHOCARDIOGRAM COMPLETE
AV Mean grad: 3 mmHg
AV Peak grad: 5 mmHg
AV area mean vel ind: 0.57 cm2/m2
AVA: 1.28 cm2
AVAREAMEANV: 1.21 cm2
AVAREAVTI: 1.21 cm2
AVAREAVTIIND: 0.61 cm2/m2
AVCELMEANRAT: 0.47
AVLVOTPG: 1 mmHg
AVPKVEL: 111 cm/s
Ao pk vel: 0.48 m/s
CHL CUP AV PEAK INDEX: 0.57
CHL CUP AV VEL: 1.28
CHL CUP TV REG PEAK VELOCITY: 351 cm/s
DOP CAL AO MEAN VELOCITY: 74.4 cm/s
EERAT: 18.81
EWDT: 148 ms
FS: 19 % — AB (ref 28–44)
Height: 72 in
IV/PV OW: 1.05
LA ID, A-P, ES: 50 mm
LA vol A4C: 191 ml
LA vol index: 78.2 mL/m2
LADIAMINDEX: 2.37 cm/m2
LAVOL: 165 mL
LDCA: 2.54 cm2
LEFT ATRIUM END SYS DIAM: 50 mm
LV E/e' medial: 18.81
LV PW d: 14.1 mm — AB (ref 0.6–1.1)
LV TDI E'LATERAL: 7.07
LV dias vol index: 41 mL/m2
LV dias vol: 87 mL (ref 62–150)
LV e' LATERAL: 7.07 cm/s
LV sys vol: 49 mL
LVEEAVG: 18.81
LVOT SV: 30 mL
LVOT VTI: 11.9 cm
LVOTD: 18 mm
LVOTPV: 52.9 cm/s
LVOTVTI: 0.5 cm
LVSYSVOLIN: 23 mL/m2
MV Dec: 148
MV VTI: 197 cm
MVPG: 7 mmHg
MVPKAVEL: 33.7 m/s
MVPKEVEL: 133 m/s
RV LATERAL S' VELOCITY: 7.51 cm/s
RV TAPSE: 15.1 mm
Simpson's disk: 43
Stroke v: 38 ml
TDI e' medial: 4.68
TRMAXVEL: 351 cm/s
VTI: 23.7 cm
Valve area index: 0.61
WEIGHTICAEL: 3061.75 [oz_av]

## 2017-04-07 LAB — BASIC METABOLIC PANEL
Anion gap: 9 (ref 5–15)
BUN: 44 mg/dL — AB (ref 6–20)
CHLORIDE: 104 mmol/L (ref 101–111)
CO2: 29 mmol/L (ref 22–32)
CREATININE: 1.81 mg/dL — AB (ref 0.61–1.24)
Calcium: 9.3 mg/dL (ref 8.9–10.3)
GFR, EST AFRICAN AMERICAN: 37 mL/min — AB (ref >60)
GFR, EST NON AFRICAN AMERICAN: 32 mL/min — AB (ref >60)
Glucose, Bld: 99 mg/dL (ref 65–99)
POTASSIUM: 3.9 mmol/L (ref 3.5–5.1)
SODIUM: 142 mmol/L (ref 135–145)

## 2017-04-07 LAB — HEPATIC FUNCTION PANEL
ALK PHOS: 67 U/L (ref 38–126)
ALT: 17 U/L (ref 17–63)
AST: 24 U/L (ref 15–41)
Albumin: 3.1 g/dL — ABNORMAL LOW (ref 3.5–5.0)
BILIRUBIN INDIRECT: 1 mg/dL — AB (ref 0.3–0.9)
BILIRUBIN TOTAL: 1.4 mg/dL — AB (ref 0.3–1.2)
Bilirubin, Direct: 0.4 mg/dL (ref 0.1–0.5)
TOTAL PROTEIN: 6.2 g/dL — AB (ref 6.5–8.1)

## 2017-04-07 LAB — GLUCOSE, CAPILLARY: Glucose-Capillary: 106 mg/dL — ABNORMAL HIGH (ref 65–99)

## 2017-04-07 LAB — CBC WITH DIFFERENTIAL/PLATELET
BASOS PCT: 0 %
Basophils Absolute: 0 10*3/uL (ref 0.0–0.1)
EOS ABS: 0 10*3/uL (ref 0.0–0.7)
EOS PCT: 0 %
HCT: 53.3 % — ABNORMAL HIGH (ref 39.0–52.0)
HEMOGLOBIN: 16.4 g/dL (ref 13.0–17.0)
LYMPHS ABS: 1.1 10*3/uL (ref 0.7–4.0)
Lymphocytes Relative: 12 %
MCH: 31.2 pg (ref 26.0–34.0)
MCHC: 30.8 g/dL (ref 30.0–36.0)
MCV: 101.5 fL — ABNORMAL HIGH (ref 78.0–100.0)
Monocytes Absolute: 1 10*3/uL (ref 0.1–1.0)
Monocytes Relative: 10 %
NEUTROS PCT: 78 %
Neutro Abs: 7.4 10*3/uL (ref 1.7–7.7)
PLATELETS: 168 10*3/uL (ref 150–400)
RBC: 5.25 MIL/uL (ref 4.22–5.81)
RDW: 15.7 % — ABNORMAL HIGH (ref 11.5–15.5)
WBC: 9.5 10*3/uL (ref 4.0–10.5)

## 2017-04-07 LAB — MRSA PCR SCREENING: MRSA BY PCR: NEGATIVE

## 2017-04-07 LAB — PHOSPHORUS: PHOSPHORUS: 3.8 mg/dL (ref 2.5–4.6)

## 2017-04-07 LAB — STREP PNEUMONIAE URINARY ANTIGEN: Strep Pneumo Urinary Antigen: NEGATIVE

## 2017-04-07 MED ORDER — IPRATROPIUM-ALBUTEROL 0.5-2.5 (3) MG/3ML IN SOLN
3.0000 mL | Freq: Four times a day (QID) | RESPIRATORY_TRACT | Status: DC
  Administered 2017-04-07 – 2017-04-09 (×10): 3 mL via RESPIRATORY_TRACT
  Filled 2017-04-07 (×10): qty 3

## 2017-04-07 MED ORDER — CARVEDILOL 3.125 MG PO TABS
3.1250 mg | ORAL_TABLET | Freq: Two times a day (BID) | ORAL | Status: DC
  Administered 2017-04-07 – 2017-04-09 (×5): 3.125 mg via ORAL
  Filled 2017-04-07 (×5): qty 1

## 2017-04-07 MED ORDER — FLUCONAZOLE IN SODIUM CHLORIDE 200-0.9 MG/100ML-% IV SOLN
200.0000 mg | Freq: Once | INTRAVENOUS | Status: AC
  Administered 2017-04-07: 200 mg via INTRAVENOUS
  Filled 2017-04-07 (×2): qty 100

## 2017-04-07 MED ORDER — RIVAROXABAN 15 MG PO TABS
15.0000 mg | ORAL_TABLET | Freq: Every day | ORAL | Status: DC
  Administered 2017-04-07 – 2017-04-09 (×3): 15 mg via ORAL
  Filled 2017-04-07 (×3): qty 1

## 2017-04-07 MED ORDER — FUROSEMIDE 10 MG/ML IJ SOLN
20.0000 mg | Freq: Once | INTRAMUSCULAR | Status: AC
  Administered 2017-04-07: 20 mg via INTRAVENOUS
  Filled 2017-04-07: qty 2

## 2017-04-07 MED ORDER — ALBUTEROL SULFATE (2.5 MG/3ML) 0.083% IN NEBU: 2.5000 mg | INHALATION_SOLUTION | RESPIRATORY_TRACT | Status: DC | PRN

## 2017-04-07 NOTE — Progress Notes (Signed)
Pharmacy:  Xarelto (home med) for Afib.  AKI this admission - dose reduced with CrCl < 50.  Baseline Cr = 1.2, if renal function returns to normal can increase back to 20mg  daily.  Mady GemmaHayes, Karis Rilling R, RPH\ 04/07/2017 8:17 AM

## 2017-04-07 NOTE — Progress Notes (Signed)
*  PRELIMINARY RESULTS* Echocardiogram 2D Echocardiogram has been performed.  Daniel Brown, Daniel Brown 04/07/2017, 2:12 PM

## 2017-04-07 NOTE — Progress Notes (Signed)
PROGRESS NOTE    Daniel Gemmaugene P Carra  ZOX:[id number]  DOB: 09/06/1927  DOA: 04/06/2017 PCP: [name], [name], MD (Inactive)   Brief Admission Hx: Daniel Brown is a 83 y.o. male with medical history significant of CAD on medical management only, ischemic cardiomyopathy, atrial fibrillation, complete heart block, pacemaker placement, history of MCA stroke, hypertension, hyperlipidemia, COPD, seizure disorder who was transferred from the nursing home due to altered mental status.  A chest radiograph was performed at the facility which apparently showed "CHF and right-sided pleural effusion"per facility's transfer documents.  The patient was found having emesis contents on his clothes.  He was admitted for aspiration pneumonia and CHF.    MDM/Assessment & Plan:   1. Aspiration pneumonia - Pt was found to have vomit on his clothes.  He was started on IV unasyn and placed in stepdown unit for closer monitoring.  Supportive care has been ordered.  Continue scheduled bronchodilators.   SLP consulted for swallow evaluation.  Keep NPO except sips with meds for now.  2. Acute systolic CHF - Was given IV lasix on admission, will place TED hoses and restrict fluids.  Monitor weights and intake/output.  Given his acute hypotension, his beta blockers and nitrates have been held temporarily while he recovers.  BP is better now, will restart coreg.  Echocardiogram was ordered.   3. AKI - suspect underlying chronic component,  Holding ACEI.  Check a renal US.   Follow BMP.   4. Hyperlipidemia - holding statin for now. Pending swallow evaluation.  5. Essential HTN - resume coreg this morning.  6. CAD - stable, no chest pain symptoms. Follow closely.  7. COPD - continue nebs and supplemental oxygen.  8. PAfib- CHADVASC Score of 7 - continue xarelto for anticoagulation.   DVT prophylaxis: xarelto Code Status: DNR Family Communication:  Disposition Plan: TBD  Subjective: Pt confused but alert and interactive.      Objective: Vitals:   04/07/17 0300 04/07/17 0400 04/07/17 0500 04/07/17 0600  BP: 112/75 117/88 116/85 121/86  Pulse: 68 72 67 72  Resp: (!) 27 (!) 8 18 (!) 27  Temp:  98.2 F (36.8 C)    TempSrc:  Axillary    SpO2: 97% 98% 95% 97%  Weight:   86.8 kg (191 lb 5.8 oz)   Height:        Intake/Output Summary (Last 24 hours) at 04/07/2017 0749 Last data filed at 04/07/2017 0245 Gross per 24 hour  Intake 1200 ml  Output -  Net 1200 ml   Filed Weights   04/06/17 1600 04/06/17 2252 04/07/17 0500  Weight: 75.8 kg (167 lb) 86.8 kg (191 lb 5.8 oz) 86.8 kg (191 lb 5.8 oz)   REVIEW OF SYSTEMS  As per history otherwise all reviewed and reported negative  Exam:  General exam: demented male, awake, alert, NAD.  Respiratory system: rales heard.  No increased work of breathing. Cardiovascular system: S1 & S2 heard, 2+ pedal edema.  Gastrointestinal system: Abdomen is nondistended, soft and nontender. Normal bowel sounds heard. Central nervous system: Alert and demented. No focal neurological deficits. Extremities: 2+ pitting edema bilateral LEs.  Data Reviewed: Basic Metabolic Panel: Recent Labs  Lab 04/06/17 1629 04/06/17 1900 04/07/17 0606  NA 145  --  142  K 3.8  --  3.9  CL 105  --  104  CO2  --   --  29  GLUCOSE 98  --  99  BUN 42*  --  44*  CREATININE 1.60*  --  1.81*  CALCIUM  --   --  9.3  MG  --  2.0  --   PHOS  --   --  3.8   Liver Function Tests: Recent Labs  Lab 04/06/17 1615 04/07/17 0606  AST 29 24  ALT 18 17  ALKPHOS 77 67  BILITOT 1.3* 1.4*  PROT 6.5 6.2*  ALBUMIN 3.2* 3.1*   No results for input(s): LIPASE, AMYLASE in the last 168 hours. Recent Labs  Lab 04/06/17 1615  AMMONIA 21   CBC: Recent Labs  Lab 04/06/17 1615 04/06/17 1629 04/07/17 0606  WBC 9.4  --  9.5  NEUTROABS 7.3  --  7.4  HGB 16.6 16.3 16.4  HCT 53.1* 48.0 53.3*  MCV 101.1*  --  101.5*  PLT 151  --  168   Cardiac Enzymes: No results for input(s): CKTOTAL, CKMB,  CKMBINDEX, TROPONINI in the last 168 hours. CBG (last 3)  No results for input(s): GLUCAP in the last 72 hours. Recent Results (from the past 240 hour(s))  MRSA PCR Screening     Status: None   Collection Time: 04/06/17 10:40 PM  Result Value Ref Range Status   MRSA by PCR NEGATIVE NEGATIVE Final    Comment:        The GeneXpert MRSA Assay (FDA approved for NASAL specimens only), is one component of a comprehensive MRSA colonization surveillance program. It is not intended to diagnose MRSA infection nor to guide or monitor treatment for MRSA infections.      Studies: Ct Head Wo Contrast  Result Date: 04/06/2017 CLINICAL DATA:  Chief eighth mental status changes. EXAM: CT HEAD WITHOUT CONTRAST TECHNIQUE: Contiguous axial images were obtained from the base of the skull through the vertex without intravenous contrast. COMPARISON:  02/23/2017 FINDINGS: Brain: There is no evidence for acute hemorrhage, hydrocephalus, mass lesion, or abnormal extra-axial fluid collection. No definite CT evidence for acute infarction. Diffuse loss of parenchymal volume is consistent with atrophy. Patchy low attenuation in the deep hemispheric and periventricular white matter is nonspecific, but likely reflects chronic microvascular ischemic demyelination. Old left MCA territory infarct again noted. Vascular: No hyperdense vessel or unexpected calcification. Skull: No evidence for fracture. No worrisome lytic or sclerotic lesion. Sinuses/Orbits: The visualized paranasal sinuses and mastoid air cells are clear. Visualized portions of the globes and intraorbital fat are unremarkable. Other: None. IMPRESSION: 1. Stable exam.  No acute intracranial abnormality. 2. Atrophy with chronic small vessel white matter ischemic disease. 3. Old left MCA territory infarct. Electronically Signed   By: Kennith Center M.D.   On: 04/06/2017 20:10   Dg Chest Port 1 View  Result Date: 04/06/2017 CLINICAL DATA:  Altered mental status  and wheezing. EXAM: PORTABLE CHEST 1 VIEW COMPARISON:  02/23/2017 FINDINGS: The cardio pericardial silhouette is enlarged. Vascular congestion with pulmonary edema noted. There is bibasilar atelectasis or pneumonia with small bilateral pleural effusions. Left permanent pacemaker again noted. Bones are diffusely demineralized. Telemetry leads overlie the chest. IMPRESSION: Cardiomegaly with pulmonary edema and basilar opacity likely atelectasis although pneumonia not excluded. Small bilateral pleural effusions. Electronically Signed   By: Kennith Center M.D.   On: 04/06/2017 17:04     Scheduled Meds: . ipratropium-albuterol  3 mL Nebulization Q6H   Continuous Infusions: . ampicillin-sulbactam (UNASYN) IV Stopped (04/07/17 8416)  . levETIRAcetam      Principal Problem:   Acute respiratory failure with hypercapnia (HCC) Active Problems:   Hyperlipidemia   Essential hypertension   Coronary atherosclerosis   COPD (  chronic obstructive pulmonary disease) (HCC)   Paroxysmal atrial fibrillation (HCC)   Pressure injury of skin   AKI (acute kidney injury) (HCC)   Aspiration pneumonia due to vomit (HCC)   Acute systolic congestive heart failure [facility](HCC)   Critical Care Time spent: 31 mins  Standley Dakinslanford Milledge Gerding, MD, FAAFP [facility] Pager [phone]  If 7PM-7AM, please contact night-coverage [email or web address] Password [email or web address] 04/07/2017, 7:49 AM    LOS: 1 day

## 2017-04-07 NOTE — Clinical Social Work Note (Signed)
Clinical Social Work Assessment  Patient Details  Name: Daniel Brown MRN: [id number] Date of Birth: 07/18/1927  Date of referral:  04/07/17               Reason for consult:  Discharge Planning                Permission sought to share information with:    Permission granted to share information::     Name::        Agency::     Relationship::     Contact Information:     Housing/Transportation Living arrangements for the past 2 months:  Skilled Nursing Facility Source of Information:  Facility Patient Interpreter Needed:  None Criminal Activity/Legal Involvement Pertinent to Current Situation/Hospitalization:  No - Comment as needed Significant Relationships:  Spouse Lives with:  Facility Resident Do you feel safe going back to the place where you live?  Yes Need for family participation in patient care:  Yes (Comment)  Care giving concerns: [facility] and [facility]   Social Worker assessment / plan: Pt is an 83 year old male admitted with altered mental status. [facility] and he plan is for transfer to LTC when rehab complete. Pt is only oriented to self this AM. [facility]. Per Tammy, pt needs only limited assistance with transfers and ambulation. He has been able to feed and bathe himself. Pt's [facility] and has been for several years. [facility] Plan is for return to [facility] at dc. Pt will need a new FL2. SW will follow and assist with dc planning.  Employment status:  Retired Database administratornsurance information:  Managed Medicare PT Recommendations:  Skilled Nursing Facility Information / Referral to community resources:     Patient/Family's Response to care: Pt appears accepting of care.  Patient/Family's Understanding of and Emotional Response to Diagnosis, Current Treatment, and Prognosis: Unable to  assess due to pt's confusion.  Emotional Assessment Appearance:  Appears stated age Attitude/Demeanor/Rapport:    Affect (typically observed):  Calm Orientation:  Oriented to Self Alcohol / Substance use:  Not Applicable Psych involvement (Current and /or in the community):  No (Comment)  Discharge Needs  Concerns to be addressed:  Care Coordination Readmission within the last 30 days:  No Current discharge risk:  None Barriers to Discharge:  No Barriers Identified   Elliot GaultKathleen Jeanice Dempsey, LCSW 04/07/2017, 10:37 AM

## 2017-04-07 NOTE — Care Management Note (Signed)
Case Management Note  Patient Details  Name: Daniel Brown MRN: [id number] Date of Birth: 11/23/1927  Subjective/Objective:    Adm from [facility] with acute respiratory failure with hypercapnia, Aspiration pneumonia and CHF. [facility] and plans to remain there after rehab is completed and transition to a long term resident.                 Action/Plan: CSW consulted and working to facilitate return to SNF when appropriate.   Expected Discharge Date:   unk                 Expected Discharge Plan:  Skilled Nursing Facility  In-House Referral:  Clinical Social Work  Discharge planning Services  CM Consult  Post Acute Care Choice:  NA Choice offered to:  NA  DME Arranged:    DME Agency:     HH Arranged:    HH Agency:     Status of Service:  Completed, signed off  If discussed at [employer] Meetings, dates discussed:    Additional Comments:  Isair Inabinet, Chrystine OilerSharley Diane, RN 04/07/2017, 1:23 PM

## 2017-04-07 NOTE — Evaluation (Signed)
Clinical/Bedside Swallow Evaluation Patient Details  Name: Daniel Brown MRN: [id number] Date of Birth: 12/10/1927  Today's Date: 04/07/2017 Time: SLP Start Time (ACUTE ONLY): 1555 SLP Stop Time (ACUTE ONLY): 1630 SLP Time Calculation (min) (ACUTE ONLY): 35 min  Past Medical History:  Past Medical History:  Diagnosis Date  . CAD (coronary artery disease)    by cath 1/11, medical management advised  . Cardiomyopathy, ischemic    EF 40% by echo 1/11  . Complete heart block (HCC)    s/p SJM PPM by JSA 01/10/11  . COPD (chronic obstructive pulmonary disease) (HCC)   . CVD (cardiovascular disease)    s/p MCA stroke 1/11  . HTN (hypertension)   . Hyperlipidemia   . Permanent atrial fibrillation (HCC)   . Seizure disorder (HCC)   . Stroke [facility](HCC)    Past Surgical History:  Past Surgical History:  Procedure Laterality Date  . CHOLECYSTECTOMY    . ERCP N/A 02/26/2017   Procedure: ENDOSCOPIC RETROGRADE CHOLANGIOPANCREATOGRAPHY (ERCP);  Surgeon: Vida RiggerMagod, Marc, MD;  [facility];  Service: Endoscopy;  Laterality: N/A;  . PACEMAKER INSERTION  01/10/11   by Fawn KirkJA for CHB   HPI:  Daniel Gemmaugene P Ogando is a 83 y.o. male with medical history significant of CAD on medical management only, ischemic cardiomyopathy, atrial fibrillation, complete heart block, pacemaker placement, history of MCA stroke, hypertension, hyperlipidemia, COPD, seizure disorder who was transferred from the nursing home due to altered mental status.  A chest radiograph was performed at the facility which apparently showed "CHF and right-sided pleural effusion"per facility's transfer documents.  The patient was found having emesis contents on his clothes.  He was admitted for aspiration pneumonia and CHF. BSE requested as Pt found to have suspected aspiration PNA.   Assessment / Plan / Recommendation Clinical Impression  Clinical swallow evaluation completed at bedside. Pt alert and cooperative, however visibly short of breath  while talking with SLP. Pt noted to have dried labial and lingual secretions; oral care completed with soft tooth brush to tongue. Pt with mild lingual weakness and reduced speech intelligibility (due to SOB, upper dentures only, and weak articulators). Pt tells SLP that he has been wheezing/SOB when talking for about three months. Pt consumed over 340 cc thin water with cues to decrease rate of intake, applesauce, and graham crackers. Pt with mild lingual residue and benefited from alternating solids/liquids. Recommend D3/mech soft and thin liquids with supervision and assist for meals. Pt will need cues to decrease rate of intake and ensure proper upright positioning. OK for PO meds whole with water (tolerating well already per RN). SLP will follow during acute stay. SLP Visit Diagnosis: Dysphagia, oropharyngeal phase (R13.12)    Aspiration Risk  Mild aspiration risk    Diet Recommendation Dysphagia 3 (Mech soft);Thin liquid   Liquid Administration via: Cup;Straw Medication Administration: Whole meds with liquid Supervision: Staff to assist with self feeding;Full supervision/cueing for compensatory strategies Compensations: Slow rate;Small sips/bites;Multiple dry swallows after each bite/sip;Follow solids with liquid Postural Changes: Seated upright at 90 degrees;Remain upright for at least 30 minutes after po intake    Other  Recommendations Oral Care Recommendations: Oral care BID;Staff/trained caregiver to provide oral care Other Recommendations: Clarify dietary restrictions   Follow up Recommendations Skilled Nursing facility      Frequency and Duration min 2x/week  1 week       Prognosis Prognosis for Safe Diet Advancement: Fair Barriers to Reach Goals: (poor respiratory support)      Swallow Study  General Date of Onset: 04/06/17 HPI: Daniel Gemmaugene P Overacker is a 83 y.o. male with medical history significant of CAD on medical management only, ischemic cardiomyopathy, atrial  fibrillation, complete heart block, pacemaker placement, history of MCA stroke, hypertension, hyperlipidemia, COPD, seizure disorder who was transferred from the nursing home due to altered mental status.  A chest radiograph was performed at the facility which apparently showed "CHF and right-sided pleural effusion"per facility's transfer documents.  The patient was found having emesis contents on his clothes.  He was admitted for aspiration pneumonia and CHF. BSE requested as Pt found to have suspected aspiration PNA. Type of Study: Bedside Swallow Evaluation Previous Swallow Assessment: None on record Diet Prior to this Study: NPO Temperature Spikes Noted: No Respiratory Status: Nasal cannula History of Recent Intubation: No Behavior/Cognition: Alert;Cooperative;Pleasant mood Oral Cavity Assessment: Dry;Dried secretions Oral Care Completed by SLP: Yes Oral Cavity - Dentition: Dentures, top Vision: Functional for self-feeding Self-Feeding Abilities: Needs assist(due to IV position and BP cuff) Patient Positioning: Upright in bed Baseline Vocal Quality: Normal(SOB while talking) Volitional Cough: Strong Volitional Swallow: Able to elicit    Oral/Motor/Sensory Function Overall Oral Motor/Sensory Function: Generalized oral weakness Facial ROM: Within Functional Limits Facial Symmetry: Within Functional Limits Facial Strength: Within Functional Limits Facial Sensation: Within Functional Limits Lingual ROM: Within Functional Limits Lingual Symmetry: Within Functional Limits Lingual Strength: Reduced Lingual Sensation: Within Functional Limits Velum: Within Functional Limits Mandible: Within Functional Limits   Ice Chips Ice chips: Within functional limits Presentation: Spoon   Thin Liquid Thin Liquid: Within functional limits Presentation: Cup;Self Fed;Straw Other Comments: 1 cough after sequential straw sips thin over 360 cc    Nectar Thick Nectar Thick Liquid: Not tested   Honey  Thick Honey Thick Liquid: Not tested   Puree Puree: Within functional limits Presentation: Spoon   Solid   Thank you,  Havery MorosDabney Keyonta Madrid, CCC-SLP [phone]    Solid: Impaired Presentation: Spoon;Self Fed Oral Phase Impairments: Reduced lingual movement/coordination Oral Phase Functional Implications: Prolonged oral transit;Oral residue        Lacrecia Delval 04/07/2017,4:36 PM

## 2017-04-08 ENCOUNTER — Inpatient Hospital Stay (HOSPITAL_COMMUNITY): Payer: Medicare Other

## 2017-04-08 ENCOUNTER — Other Ambulatory Visit: Payer: Self-pay

## 2017-04-08 DIAGNOSIS — I10 Essential (primary) hypertension: Secondary | ICD-10-CM

## 2017-04-08 LAB — CBC WITH DIFFERENTIAL/PLATELET
BASOS ABS: 0 10*3/uL (ref 0.0–0.1)
Basophils Relative: 0 %
EOS PCT: 2 %
Eosinophils Absolute: 0.1 10*3/uL (ref 0.0–0.7)
HCT: 54.8 % — ABNORMAL HIGH (ref 39.0–52.0)
HEMOGLOBIN: 16.6 g/dL (ref 13.0–17.0)
LYMPHS ABS: 0.9 10*3/uL (ref 0.7–4.0)
LYMPHS PCT: 11 %
MCH: 31 pg (ref 26.0–34.0)
MCHC: 30.3 g/dL (ref 30.0–36.0)
MCV: 102.4 fL — AB (ref 78.0–100.0)
Monocytes Absolute: 0.8 10*3/uL (ref 0.1–1.0)
Monocytes Relative: 10 %
NEUTROS PCT: 77 %
Neutro Abs: 6.6 10*3/uL (ref 1.7–7.7)
PLATELETS: 156 10*3/uL (ref 150–400)
RBC: 5.35 MIL/uL (ref 4.22–5.81)
RDW: 15.7 % — ABNORMAL HIGH (ref 11.5–15.5)
WBC: 8.5 10*3/uL (ref 4.0–10.5)

## 2017-04-08 LAB — URINE CULTURE: CULTURE: NO GROWTH

## 2017-04-08 LAB — BASIC METABOLIC PANEL
ANION GAP: 8 (ref 5–15)
BUN: 40 mg/dL — AB (ref 6–20)
CALCIUM: 9 mg/dL (ref 8.9–10.3)
CO2: 30 mmol/L (ref 22–32)
CREATININE: 1.67 mg/dL — AB (ref 0.61–1.24)
Chloride: 105 mmol/L (ref 101–111)
GFR calc Af Amer: 41 mL/min — ABNORMAL LOW (ref >60)
GFR, EST NON AFRICAN AMERICAN: 35 mL/min — AB (ref >60)
Glucose, Bld: 100 mg/dL — ABNORMAL HIGH (ref 65–99)
Potassium: 3.7 mmol/L (ref 3.5–5.1)
SODIUM: 143 mmol/L (ref 135–145)

## 2017-04-08 MED ORDER — ISOSORBIDE MONONITRATE ER 30 MG PO TB24
15.0000 mg | ORAL_TABLET | Freq: Every day | ORAL | Status: DC
  Administered 2017-04-08 – 2017-04-09 (×2): 15 mg via ORAL
  Filled 2017-04-08 (×2): qty 1

## 2017-04-08 MED ORDER — NITROGLYCERIN 0.4 MG SL SUBL: 0.4000 mg | SUBLINGUAL_TABLET | SUBLINGUAL | Status: DC | PRN

## 2017-04-08 MED ORDER — FUROSEMIDE 10 MG/ML IJ SOLN
40.0000 mg | Freq: Two times a day (BID) | INTRAMUSCULAR | Status: DC
  Administered 2017-04-08 – 2017-04-09 (×2): 40 mg via INTRAVENOUS
  Filled 2017-04-08 (×2): qty 4

## 2017-04-08 MED ORDER — FUROSEMIDE 20 MG PO TABS: 30.0000 mg | ORAL_TABLET | Freq: Every day | ORAL | Status: DC

## 2017-04-08 MED ORDER — LEVETIRACETAM 250 MG PO TABS
250.0000 mg | ORAL_TABLET | Freq: Two times a day (BID) | ORAL | Status: DC
  Administered 2017-04-08 – 2017-04-09 (×3): 250 mg via ORAL
  Filled 2017-04-08 (×3): qty 1

## 2017-04-08 MED ORDER — POTASSIUM CHLORIDE CRYS ER 20 MEQ PO TBCR
50.0000 meq | EXTENDED_RELEASE_TABLET | Freq: Every day | ORAL | Status: AC
  Administered 2017-04-08 – 2017-04-09 (×2): 50 meq via ORAL
  Filled 2017-04-08 (×2): qty 1

## 2017-04-08 NOTE — Progress Notes (Signed)
  Speech Language Pathology Treatment: Dysphagia  Patient Details Name: Daniel Brown MRN: [id number] DOB: 09/03/1927 Today's Date: 04/08/2017 Time:  -     Assessment / Plan / Recommendation Clinical Impression  Pt seen during breakfast meal- up in bed attempting to self feed. Pt again appears SOB and audible wheezing with difficulty speaking clearly. Pt required verbal and visual cues for small sips when drinking via straw. Pt ate very little of his breakfast and stated he didn't want anymore. Chest x-ray report reviewed and appears worse than previous. Will proceed with MBSS later this afternoon. Above to RN.   HPI HPI: Daniel Brown is a 83 y.o. male with medical history significant of CAD on medical management only, ischemic cardiomyopathy, atrial fibrillation, complete heart block, pacemaker placement, history of MCA stroke, hypertension, hyperlipidemia, COPD, seizure disorder who was transferred from the nursing home due to altered mental status.  A chest radiograph was performed at the facility which apparently showed "CHF and right-sided pleural effusion"per facility's transfer documents.  The patient was found having emesis contents on his clothes.  He was admitted for aspiration pneumonia and CHF. BSE requested as Pt found to have suspected aspiration PNA.      SLP Plan  MBS       Recommendations                   Plan: MBS       Thank you,  Havery MorosDabney Porter, CCC-SLP [phone]                 PORTER,DABNEY 04/08/2017, 12:15 PM

## 2017-04-08 NOTE — Progress Notes (Signed)
PROGRESS NOTE  Daniel Brown  ZOX:[id number]  DOB: 06/11/1927  DOA: 04/06/2017 PCP: [name], [name], MD (Inactive)   Brief Admission Hx: Daniel Brown is a 83 y.o. male with medical history significant of CAD on medical management only, ischemic cardiomyopathy, atrial fibrillation, complete heart block, pacemaker placement, history of MCA stroke, hypertension, hyperlipidemia, COPD, seizure disorder who was transferred from the nursing home due to altered mental status.  A chest radiograph was performed at the facility which apparently showed "CHF and right-sided pleural effusion"per facility's transfer documents.  The patient was found having emesis contents on his clothes.  He was admitted for aspiration pneumonia and CHF.    MDM/Assessment & Plan:   1. Aspiration pneumonia - Pt was found to have vomit on his clothes.  He was started on IV unasyn and placed in stepdown unit for closer monitoring.  Will move down to telemetry 11/28.  Supportive care has been ordered.  Continue scheduled bronchodilators.   SLP evaluated.  2. Acute systolic CHF - IV lasix ordered.  will place TED hoses and restrict fluids.  Monitor weights and intake/output.  Given his acute hypotension, his beta blockers and nitrates have been held temporarily while he recovers.  BP is better now, will restart coreg.  Echocardiogram pending.    3. AKI - suspect underlying chronic component,  Holding ACEI.  Renal US 10/18 with bilateral cysts seen.   Follow BMP.   4. Hyperlipidemia - resuming home statin.  5. Essential HTN - resumed coreg 11/27.  6. CAD - stable, no chest pain symptoms. Follow closely. Resumed home imdur but reduced dose by 50% due to soft BPs.  7. COPD - continue nebs and supplemental oxygen.  8. PAfib- CHADVASC Score of 7 - continue xarelto for anticoagulation.   DVT prophylaxis: xarelto Code Status: DNR Family Communication:  Disposition Plan: TBD  Subjective: Pt less confused today and more  interactive    Objective: Vitals:   04/08/17 0500 04/08/17 0600 04/08/17 0749 04/08/17 0807  BP: (!) 114/23 (!) 118/92    Pulse: 69  (!) 59   Resp: (!) 24 10 (!) 8   Temp:   (!) 97.5 F (36.4 C)   TempSrc:   Oral   SpO2: 97%  95% 100%  Weight: 88 kg (194 lb 0.1 oz)     Height:        Intake/Output Summary (Last 24 hours) at 04/08/2017 [id number] Last data filed at 04/08/2017 0500 Gross per 24 hour  Intake 300 ml  Output 1050 ml  Net -750 ml   Filed Weights   04/06/17 2252 04/07/17 0500 04/08/17 0500  Weight: 86.8 kg (191 lb 5.8 oz) 86.8 kg (191 lb 5.8 oz) 88 kg (194 lb 0.1 oz)   REVIEW OF SYSTEMS  As per history otherwise all reviewed and reported negative  Exam:  General exam: elderly  male, awake, alert, NAD. Marked speech impediment, edentulous, dry MM. Respiratory system: rales heard.  No increased work of breathing. Cardiovascular system: S1 & S2 heard, 2+ pedal edema.  Gastrointestinal system: Abdomen is nondistended, soft and nontender. Normal bowel sounds heard. Central nervous system: Alert and demented. No focal neurological deficits. Extremities: 2+ pitting edema bilateral LEs. TED Hose in place.    Data Reviewed: Basic Metabolic Panel: Recent Labs  Lab 04/06/17 1629 04/06/17 1900 04/07/17 0606 04/08/17 0558  NA 145  --  142 143  K 3.8  --  3.9 3.7  CL 105  --  104 105  CO2  --   --  29 30  GLUCOSE 98  --  99 100*  BUN 42*  --  44* 40*  CREATININE 1.60*  --  1.81* 1.67*  CALCIUM  --   --  9.3 9.0  MG  --  2.0  --   --   PHOS  --   --  3.8  --    Liver Function Tests: Recent Labs  Lab 04/06/17 1615 04/07/17 0606  AST 29 24  ALT 18 17  ALKPHOS 77 67  BILITOT 1.3* 1.4*  PROT 6.5 6.2*  ALBUMIN 3.2* 3.1*   No results for input(s): LIPASE, AMYLASE in the last 168 hours. Recent Labs  Lab 04/06/17 1615  AMMONIA 21   CBC: Recent Labs  Lab 04/06/17 1615 04/06/17 1629 04/07/17 0606 04/08/17 0558  WBC 9.4  --  9.5 8.5  NEUTROABS 7.3  --   7.4 6.6  HGB 16.6 16.3 16.4 16.6  HCT 53.1* 48.0 53.3* 54.8*  MCV 101.1*  --  101.5* 102.4*  PLT 151  --  168 156   Cardiac Enzymes: No results for input(s): CKTOTAL, CKMB, CKMBINDEX, TROPONINI in the last 168 hours. CBG (last 3)  Recent Labs    04/07/17 2013  GLUCAP 106*   Recent Results (from the past 240 hour(s))  MRSA PCR Screening     Status: None   Collection Time: 04/06/17 10:40 PM  Result Value Ref Range Status   MRSA by PCR NEGATIVE NEGATIVE Final    Comment:        The GeneXpert MRSA Assay (FDA approved for NASAL specimens only), is one component of a comprehensive MRSA colonization surveillance program. It is not intended to diagnose MRSA infection nor to guide or monitor treatment for MRSA infections.      Studies: Ct Head Wo Contrast  Result Date: 04/06/2017 CLINICAL DATA:  Chief eighth mental status changes. EXAM: CT HEAD WITHOUT CONTRAST TECHNIQUE: Contiguous axial images were obtained from the base of the skull through the vertex without intravenous contrast. COMPARISON:  02/23/2017 FINDINGS: Brain: There is no evidence for acute hemorrhage, hydrocephalus, mass lesion, or abnormal extra-axial fluid collection. No definite CT evidence for acute infarction. Diffuse loss of parenchymal volume is consistent with atrophy. Patchy low attenuation in the deep hemispheric and periventricular white matter is nonspecific, but likely reflects chronic microvascular ischemic demyelination. Old left MCA territory infarct again noted. Vascular: No hyperdense vessel or unexpected calcification. Skull: No evidence for fracture. No worrisome lytic or sclerotic lesion. Sinuses/Orbits: The visualized paranasal sinuses and mastoid air cells are clear. Visualized portions of the globes and intraorbital fat are unremarkable. Other: None. IMPRESSION: 1. Stable exam.  No acute intracranial abnormality. 2. Atrophy with chronic small vessel white matter ischemic disease. 3. Old left MCA  territory infarct. Electronically Signed   By: Kennith Center M.D.   On: 04/06/2017 20:10   Dg Chest Port 1 View  Result Date: 04/08/2017 CLINICAL DATA:  83 year old male with altered mental status, vomiting, respiratory failure, suspected aspiration. EXAM: PORTABLE CHEST 1 VIEW COMPARISON:  04/06/2017 and earlier. FINDINGS: Portable AP upright view at 0517 hours. Moderate to severe cardiomegaly. Stable left chest cardiac pacemaker. Worsening bibasilar ventilation probably with a combination of lower lung volumes plus increasing veiling and confluent lung base opacity. No pneumothorax. Stable pulmonary vascularity. IMPRESSION: Worsening bilateral lower lung ventilation since yesterday felt to represent a combination of lower lung volumes plus increasing pleural effusions and lower lobe collapse or consolidation. Electronically Signed   By: Althea Grimmer.D.  On: 04/08/2017 07:17   Dg Chest Port 1 View  Result Date: 04/06/2017 CLINICAL DATA:  Altered mental status and wheezing. EXAM: PORTABLE CHEST 1 VIEW COMPARISON:  02/23/2017 FINDINGS: The cardio pericardial silhouette is enlarged. Vascular congestion with pulmonary edema noted. There is bibasilar atelectasis or pneumonia with small bilateral pleural effusions. Left permanent pacemaker again noted. Bones are diffusely demineralized. Telemetry leads overlie the chest. IMPRESSION: Cardiomegaly with pulmonary edema and basilar opacity likely atelectasis although pneumonia not excluded. Small bilateral pleural effusions. Electronically Signed   By: Kennith CenterEric  Mansell M.D.   On: 04/06/2017 17:04   Scheduled Meds: . carvedilol  3.125 mg Oral BID  . furosemide  40 mg Intravenous Q12H  . ipratropium-albuterol  3 mL Nebulization Q6H  . isosorbide mononitrate  15 mg Oral Daily  . levETIRAcetam  250 mg Oral BID  . potassium chloride  50 mEq Oral Daily  . Rivaroxaban  15 mg Oral Daily   Continuous Infusions: . ampicillin-sulbactam (UNASYN) IV 3 g (04/08/17 0504)      Principal Problem:   Acute respiratory failure with hypercapnia (HCC) Active Problems:   Hyperlipidemia   Essential hypertension   Coronary atherosclerosis   COPD (chronic obstructive pulmonary disease) (HCC)   Paroxysmal atrial fibrillation (HCC)   Pressure injury of skin   AKI (acute kidney injury) (HCC)   Aspiration pneumonia due to vomit (HCC)   Acute systolic congestive heart failure [facility](HCC)  Critical Care Time spent: 32 mins  Standley Dakinslanford Dushawn Pusey, MD, FAAFP [facility] Pager [phone]  If 7PM-7AM, please contact night-coverage [email or web address] Password [email or web address] 04/08/2017, 9:39 AM    LOS: 2 days

## 2017-04-08 NOTE — Progress Notes (Signed)
Modified Barium Swallow Progress Note  Patient Details  Name: Daniel Brown P Ewings MRN: [id number] Date of Birth: 07/18/1927  Today's Date: 04/08/2017  Modified Barium Swallow completed.  Full report located under Chart Review in the Imaging Section.  Brief recommendations include the following:  Clinical Impression  Pt assessed in the lateral position with barium tinged thin, nectar, puree, mech soft, and regular textures. Pt presents with moderate oropharyngeal phase dysphagia characterized by weak lingual manipulation, mastication, and bolus propulsion resulting in delayed oral transit, piecemeal deglutition, oral residue, and premature spillage; pharyngeal phase is marked by delay in swallow initiation with swallow trigger after filling the pyriforms, reduced tongue base retraction, and reduced laryngeal vestibule closure resulting in inconsistent penetration with occasion to the cords. As study progressed, Pt appeared to become more fatigued and no swallow trigger elicited with solids (pooled in pyriforms and valleculae) and also more confused. No gross aspiration observed, however Pt is at high risk for aspiration due to decreased cognition, reduced respiratory support, and delays in swallow trigger. Pharyngeal strength only mildly impaired once the swallow is triggered as evidenced by little residue post swallow. Pt did not follow verbal and visual cues for small sips, repeat/dry swallow, or other compensatory strategies. He needs tactile cue of hand to cup to encourage small sips/slow rate of presentation. Recommend D1/puree with thin liquids with 1:1 feeder assist toe ensure proper positioning and rate control. Prognosis for advancing diet is good once clinically improved (ie. shortness of breath). Present medications whole in puree. Above to RN, Arline Aspindy. SLP to follow during acute stay and recommend f/u SLP services at SNF.    Swallow Evaluation Recommendations       SLP Diet Recommendations:  Dysphagia 1 (Puree) solids;Thin liquid   Liquid Administration via: Cup;Straw   Medication Administration: Whole meds with puree   Supervision: Full assist for feeding;Staff to assist with self feeding;Full supervision/cueing for compensatory strategies   Compensations: Slow rate;Small sips/bites;Multiple dry swallows after each bite/sip;Follow solids with liquid   Postural Changes: Remain semi-upright after after feeds/meals (Comment);Seated upright at 90 degrees   Oral Care Recommendations: Oral care BID;Staff/trained caregiver to provide oral care   Other Recommendations: Clarify dietary restrictions   Thank you,  Havery MorosDabney Porter, CCC-SLP [phone]  PORTER,DABNEY 04/08/2017,3:03 PM

## 2017-04-09 DIAGNOSIS — J9601 Acute respiratory failure with hypoxia: Secondary | ICD-10-CM

## 2017-04-09 LAB — CBC WITH DIFFERENTIAL/PLATELET
Basophils Absolute: 0 10*3/uL (ref 0.0–0.1)
Basophils Relative: 0 %
Eosinophils Absolute: 0.2 10*3/uL (ref 0.0–0.7)
Eosinophils Relative: 3 %
HEMATOCRIT: 52.6 % — AB (ref 39.0–52.0)
Hemoglobin: 16.2 g/dL (ref 13.0–17.0)
LYMPHS PCT: 10 %
Lymphs Abs: 0.8 10*3/uL (ref 0.7–4.0)
MCH: 31.5 pg (ref 26.0–34.0)
MCHC: 30.8 g/dL (ref 30.0–36.0)
MCV: 102.1 fL — AB (ref 78.0–100.0)
MONO ABS: 0.8 10*3/uL (ref 0.1–1.0)
MONOS PCT: 9 %
NEUTROS ABS: 6.8 10*3/uL (ref 1.7–7.7)
Neutrophils Relative %: 79 %
Platelets: 147 10*3/uL — ABNORMAL LOW (ref 150–400)
RBC: 5.15 MIL/uL (ref 4.22–5.81)
RDW: 15.7 % — AB (ref 11.5–15.5)
WBC: 8.6 10*3/uL (ref 4.0–10.5)

## 2017-04-09 LAB — BASIC METABOLIC PANEL
Anion gap: 9 (ref 5–15)
BUN: 34 mg/dL — ABNORMAL HIGH (ref 6–20)
CALCIUM: 9 mg/dL (ref 8.9–10.3)
CO2: 31 mmol/L (ref 22–32)
CREATININE: 1.45 mg/dL — AB (ref 0.61–1.24)
Chloride: 102 mmol/L (ref 101–111)
GFR calc Af Amer: 48 mL/min — ABNORMAL LOW (ref >60)
GFR calc non Af Amer: 42 mL/min — ABNORMAL LOW (ref >60)
GLUCOSE: 105 mg/dL — AB (ref 65–99)
Potassium: 3.9 mmol/L (ref 3.5–5.1)
Sodium: 142 mmol/L (ref 135–145)

## 2017-04-09 LAB — MAGNESIUM: Magnesium: 1.7 mg/dL (ref 1.7–2.4)

## 2017-04-09 MED ORDER — FUROSEMIDE 40 MG PO TABS: 40.0000 mg | ORAL_TABLET | Freq: Every day | ORAL | 2 refills | Status: AC

## 2017-04-09 MED ORDER — AMOXICILLIN-POT CLAVULANATE 500-125 MG PO TABS: 1.0000 | ORAL_TABLET | Freq: Three times a day (TID) | ORAL | 0 refills | Status: DC

## 2017-04-09 MED ORDER — IPRATROPIUM-ALBUTEROL 0.5-2.5 (3) MG/3ML IN SOLN
3.0000 mL | Freq: Three times a day (TID) | RESPIRATORY_TRACT | Status: DC
  Administered 2017-04-09: 3 mL via RESPIRATORY_TRACT
  Filled 2017-04-09: qty 3

## 2017-04-09 NOTE — Discharge Summary (Signed)
Physician Discharge Summary  Daniel Brown ZOX:[id number] DOB: [birth year] DOA: 04/06/2017  PCP: [name], MD (Inactive)  Admit date: 04/06/2017 Discharge date: 04/09/2017  Time spent: 45 minutes  Recommendations for Outpatient Follow-up:  -To be discharged back to SNF today in stable condition. -Complete course of Augmentin for aspiration pneumonia, also advised to use daily Lasix for acute CHF. -Seen by speech therapy with recommendations for a dysphagia 1 (pured) diet with thin liquids.  Discharge Diagnoses:  Principal Problem:   Acute respiratory failure with hypercapnia (HCC) Active Problems:   Hyperlipidemia   Essential hypertension   Coronary atherosclerosis   COPD (chronic obstructive pulmonary disease) (HCC)   Paroxysmal atrial fibrillation (HCC)   Pressure injury of skin   AKI (acute kidney injury) (HCC)   Aspiration pneumonia due to vomit (HCC)   Acute systolic congestive heart failure (HCC)   Discharge Condition: Stable and improved  Filed Weights   04/07/17 0500 04/08/17 0500 04/09/17 0500  Weight: 86.8 kg (191 lb 5.8 oz) 88 kg (194 lb 0.1 oz) 87.2 kg (192 lb 3.2 oz)    History of present illness:  As per Dr. Robb Matar on 11/26: Daniel Brown is a 83 y.o. male with medical history significant of CAD on medical management only, ischemic cardiomyopathy, atrial fibrillation, complete heart block, pacemaker placement, history of MCA stroke, hypertension, hyperlipidemia, COPD, seizure disorder who was transferred from the nursing home due to altered mental status.  A chest radiograph was performed at the facility which apparently showed "CHF and right-sided pleural effusion"per facility's transfer documents.  The patient was found having emesis contents on his clothes.  He is unable to provide further history.  ED Course: Initial vital signs temperature 36.7C, pulse 76, respirations 18, blood pressure 107/86 mmHg and O2 sat 100% on room air.  The patient  was given 1 L NS bolus in the emergency department.  He has required supplemental nasal cannula oxygen to maintain a normal O2 saturation.  His workup shows an urinalysis with moderate hemoglobinuria (6-30 RBC and microscopic), but otherwise unremarkable.  A sodium of 145, potassium 3.8, chloride 1.05, BUN 42, creatinine 1.60, glucose 98 and CO2 27.  His CBC shows a white count of 9.4 with a normal differential, hemoglobin 16.6 g/dL and platelets of 811.  Initial troponin was normal.  His LFTs show a mildly decreased albumin at 3.2 g/dL and total bilirubin 1.3 mg/dL.  Initial lactic acid was 3.31, but after IV bolus was 2.03 mmol/L.  Hospital Course:   1. Aspiration pneumonia - Pt was found to have vomit on his clothes.    He has symptomatically improved on Unasyn, will transition over to Augmentin to complete a total of 10 days upon discharge.  He has been seen by speech therapy was recommended a dysphagia 1, pured diet with thin liquids. 2. Acute systolic CHF - continue beta-blocker, ACE inhibitor, still with some lower extremity edema upon discharge, however no JVD and no lung crackles.  He was only taking Lasix as needed, will institute Lasix 40 mg daily.  Echo shows an ejection fraction of 40% with hypokinesis of the inferior myocardium.  Discussed with patient, he states that under no circumstances would he want a heart catheterization or any other cardiac workup, for this reason I have not consulted cardiology. 3. AKI -resolved, resume low-dose ACE and discharge. 4. Hyperlipidemia - resuming home statin.  5. Essential HTN -soft blood pressures initially, now stable. 6. CAD - stable, no chest pain symptoms. Follow  closely.  7. COPD - continue nebs and supplemental oxygen.  8. PAfib- CHADVASC Score of 7 - continue xarelto for anticoagulation.      Procedures:  None  Consultations:  None  Discharge Instructions  Discharge Instructions    Diet - low sodium heart healthy   Complete  by:  As directed    Increase activity slowly   Complete by:  As directed      Allergies as of 04/09/2017   No Known Allergies     Medication List    TAKE these medications   amoxicillin-clavulanate 500-125 MG tablet Commonly known as:  AUGMENTIN Take 1 tablet (500 mg total) by mouth 3 (three) times daily.   carvedilol 3.125 MG tablet Commonly known as:  COREG Take 3.125 mg by mouth 2 (two) times daily.   ezetimibe 10 MG tablet Commonly known as:  ZETIA Take 10 mg by mouth daily.   furosemide 40 MG tablet Commonly known as:  LASIX Take 1 tablet (40 mg total) by mouth daily. What changed:    how much to take  when to take this  additional instructions   isosorbide mononitrate 30 MG 24 hr tablet Commonly known as:  IMDUR Take 30 mg by mouth daily.   levETIRAcetam 250 MG tablet Commonly known as:  KEPPRA Take 250 mg by mouth 2 (two) times daily.   lisinopril 2.5 MG tablet Commonly known as:  PRINIVIL,ZESTRIL Take 1 tablet (2.5 mg total) by mouth daily.   NITROSTAT 0.4 MG SL tablet Generic drug:  nitroGLYCERIN Place 0.4 mg under the tongue as needed for chest pain.   Vitamin D 2000 units Caps Take 2,000 Units by mouth daily.   XARELTO 20 MG Tabs tablet Generic drug:  rivaroxaban TAKE 1 TABLET BY MOUTH  DAILY      No Known Allergies  Contact information for follow-up providers    [name], [name], MD. Schedule an appointment as soon as possible for a visit in 1 week(s).   Specialty:  Family Medicine Contact information: [address], [place] [address] [place] [address] [phone]            Contact information for after-discharge care    Destination    [facility] .   Service:  Skilled Nursing Contact information: [address] [place] [id number] [phone]                   The results of significant diagnostics from this hospitalization (including imaging, microbiology, ancillary and  laboratory) are listed below for reference.    Significant Diagnostic Studies: Ct Head Wo Contrast  Result Date: 04/06/2017 CLINICAL DATA:  Chief eighth mental status changes. EXAM: CT HEAD WITHOUT CONTRAST TECHNIQUE: Contiguous axial images were obtained from the base of the skull through the vertex without intravenous contrast. COMPARISON:  02/23/2017 FINDINGS: Brain: There is no evidence for acute hemorrhage, hydrocephalus, mass lesion, or abnormal extra-axial fluid collection. No definite CT evidence for acute infarction. Diffuse loss of parenchymal volume is consistent with atrophy. Patchy low attenuation in the deep hemispheric and periventricular white matter is nonspecific, but likely reflects chronic microvascular ischemic demyelination. Old left MCA territory infarct again noted. Vascular: No hyperdense vessel or unexpected calcification. Skull: No evidence for fracture. No worrisome lytic or sclerotic lesion. Sinuses/Orbits: The visualized paranasal sinuses and mastoid air cells are clear. Visualized portions of the globes and intraorbital fat are unremarkable. Other: None. IMPRESSION: 1. Stable exam.  No acute intracranial abnormality. 2. Atrophy with  chronic small vessel white matter ischemic disease. 3. Old left MCA territory infarct. Electronically Signed   By: Kennith Center M.D.   On: 04/06/2017 20:10   Dg Chest Port 1 View  Result Date: 04/08/2017 CLINICAL DATA:  83 year old male with altered mental status, vomiting, respiratory failure, suspected aspiration. EXAM: PORTABLE CHEST 1 VIEW COMPARISON:  04/06/2017 and earlier. FINDINGS: Portable AP upright view at 0517 hours. Moderate to severe cardiomegaly. Stable left chest cardiac pacemaker. Worsening bibasilar ventilation probably with a combination of lower lung volumes plus increasing veiling and confluent lung base opacity. No pneumothorax. Stable pulmonary vascularity. IMPRESSION: Worsening bilateral lower lung ventilation since  yesterday felt to represent a combination of lower lung volumes plus increasing pleural effusions and lower lobe collapse or consolidation. Electronically Signed   By: Odessa Fleming M.D.   On: 04/08/2017 07:17   Dg Chest Port 1 View  Result Date: 04/06/2017 CLINICAL DATA:  Altered mental status and wheezing. EXAM: PORTABLE CHEST 1 VIEW COMPARISON:  02/23/2017 FINDINGS: The cardio pericardial silhouette is enlarged. Vascular congestion with pulmonary edema noted. There is bibasilar atelectasis or pneumonia with small bilateral pleural effusions. Left permanent pacemaker again noted. Bones are diffusely demineralized. Telemetry leads overlie the chest. IMPRESSION: Cardiomegaly with pulmonary edema and basilar opacity likely atelectasis although pneumonia not excluded. Small bilateral pleural effusions. Electronically Signed   By: Kennith Center M.D.   On: 04/06/2017 17:04   Dg Swallowing Func-speech Pathology  Result Date: 04/08/2017 Objective Swallowing Evaluation: Type of Study: MBS-Modified Barium Swallow Study  Patient Details Name: Daniel Brown MRN: [id number] Date of Birth: April 16, 1928 Today's Date: 04/08/2017 Time: SLP Start Time (ACUTE ONLY): 1300 -SLP Stop Time (ACUTE ONLY): 1338 SLP Time Calculation (min) (ACUTE ONLY): 38 min Past Medical History: Past Medical History: Diagnosis Date . CAD (coronary artery disease)   by cath 1/11, medical management advised . Cardiomyopathy, ischemic   EF 40% by echo 1/11 . Complete heart block (HCC)   s/p SJM PPM by JSA 01/10/11 . COPD (chronic obstructive pulmonary disease) (HCC)  . CVD (cardiovascular disease)   s/p MCA stroke 1/11 . HTN (hypertension)  . Hyperlipidemia  . Permanent atrial fibrillation (HCC)  . Seizure disorder (HCC)  . Stroke [facility])  Past Surgical History: Past Surgical History: Procedure Laterality Date . CHOLECYSTECTOMY   . ERCP N/A 02/26/2017  Procedure: ENDOSCOPIC RETROGRADE CHOLANGIOPANCREATOGRAPHY (ERCP);  Surgeon: Vida Rigger, MD;  Location: [facility]  ENDOSCOPY;  Service: Endoscopy;  Laterality: N/A; . PACEMAKER INSERTION  01/10/11  by Fawn Kirk for [facility] HPI: Daniel Brown is a 83 y.o. male with medical history significant of CAD on medical management only, ischemic cardiomyopathy, atrial fibrillation, complete heart block, pacemaker placement, history of MCA stroke, hypertension, hyperlipidemia, COPD, seizure disorder who was transferred from the nursing home due to altered mental status.  A chest radiograph was performed at the facility which apparently showed "CHF and right-sided pleural effusion"per facility's transfer documents.  The patient was found having emesis contents on his clothes.  He was admitted for aspiration pneumonia and CHF. BSE requested as Pt found to have suspected aspiration PNA.  Subjective: "I am ok." Assessment / Plan / Recommendation CHL IP CLINICAL IMPRESSIONS 04/08/2017 Clinical Impression Pt assessed in the lateral position with barium tinged thin, nectar, puree, mech soft, and regular textures. Pt presents with moderate oropharyngeal phase dysphagia characterized by weak lingual manipulation, mastication, and bolus propulsion resulting in delayed oral transit, piecemeal deglutition, oral residue, and premature spillage; pharyngeal phase is marked by delay in  swallow initiation with swallow trigger after filling the pyriforms, reduced tongue base retraction, and reduced laryngeal vestibule closure resulting in inconsistent penetration with occasion to the cords. As study progressed, Pt appeared to become more fatigued and no swallow trigger elicited with solids (pooled in pyriforms and valleculae) and also more confused. No gross aspiration observed, however Pt is at high risk for aspiration due to decreased cognition, reduced respiratory support, and delays in swallow trigger. Pharyngeal strength only mildly impaired once the swallow is triggered as evidenced by little residue post swallow. Pt did not follow verbal and visual cues for  small sips, repeat/dry swallow, or other compensatory strategies. He needs tactile cue of hand to cup to encourage small sips/slow rate of presentation. Recommend D1/puree with thin liquids with 1:1 feeder assist toe ensure proper positioning and rate control. Prognosis for advancing diet is good once clinically improved (ie. shortness of breath). Present medications whole in puree. Above to RN, Arline Asp. SLP to follow during acute stay and recommend f/u SLP services at SNF.  SLP Visit Diagnosis Dysphagia, oropharyngeal phase (R13.12) Attention and concentration deficit following -- Frontal lobe and executive function deficit following -- Impact on safety and function Moderate aspiration risk   CHL IP TREATMENT RECOMMENDATION 04/08/2017 Treatment Recommendations Therapy as outlined in treatment plan below   Prognosis 04/08/2017 Prognosis for Safe Diet Advancement Fair Barriers to Reach Goals Severity of deficits Barriers/Prognosis Comment -- CHL IP DIET RECOMMENDATION 04/08/2017 SLP Diet Recommendations Dysphagia 1 (Puree) solids;Thin liquid Liquid Administration via Cup;Straw Medication Administration Whole meds with puree Compensations Slow rate;Small sips/bites;Multiple dry swallows after each bite/sip;Follow solids with liquid Postural Changes Remain semi-upright after after feeds/meals (Comment);Seated upright at 90 degrees   CHL IP OTHER RECOMMENDATIONS 04/08/2017 Recommended Consults -- Oral Care Recommendations Oral care BID;Staff/trained caregiver to provide oral care Other Recommendations Clarify dietary restrictions   CHL IP FOLLOW UP RECOMMENDATIONS 04/08/2017 Follow up Recommendations Skilled Nursing facility   [facility] IP FREQUENCY AND DURATION 04/08/2017 Speech Therapy Frequency (ACUTE ONLY) min 2x/week Treatment Duration 1 week      CHL IP ORAL PHASE 04/08/2017 Oral Phase Impaired Oral - Pudding Teaspoon -- Oral - Pudding Cup -- Oral - Honey Teaspoon -- Oral - Honey Cup -- Oral - Nectar Teaspoon -- Oral -  Nectar Cup -- Oral - Nectar Straw -- Oral - Thin Teaspoon -- Oral - Thin Cup -- Oral - Thin Straw -- Oral - Puree -- Oral - Mech Soft Impaired mastication;Weak lingual manipulation;Incomplete tongue to palate contact;Reduced posterior propulsion;Piecemeal swallowing;Delayed oral transit;Decreased bolus cohesion Oral - Regular Impaired mastication;Weak lingual manipulation;Incomplete tongue to palate contact;Reduced posterior propulsion;Piecemeal swallowing;Delayed oral transit;Decreased bolus cohesion Oral - Multi-Consistency -- Oral - Pill NT Oral Phase - Comment --  CHL IP PHARYNGEAL PHASE 04/08/2017 Pharyngeal Phase Impaired Pharyngeal- Pudding Teaspoon -- Pharyngeal -- Pharyngeal- Pudding Cup -- Pharyngeal -- Pharyngeal- Honey Teaspoon -- Pharyngeal -- Pharyngeal- Honey Cup -- Pharyngeal -- Pharyngeal- Nectar Teaspoon -- Pharyngeal -- Pharyngeal- Nectar Cup -- Pharyngeal -- Pharyngeal- Nectar Straw Delayed swallow initiation-pyriform sinuses;Reduced airway/laryngeal closure;Penetration/Aspiration during swallow;Penetration/Apiration after swallow;Pharyngeal residue - valleculae;Pharyngeal residue - pyriform;Lateral channel residue Pharyngeal Material does not enter airway;Material enters airway, remains ABOVE vocal cords then ejected out;Material enters airway, CONTACTS cords and then ejected out;Material enters airway, remains ABOVE vocal cords and not ejected out Pharyngeal- Thin Teaspoon Delayed swallow initiation-pyriform sinuses Pharyngeal -- Pharyngeal- Thin Cup Delayed swallow initiation-pyriform sinuses;Reduced airway/laryngeal closure;Penetration/Aspiration during swallow;Penetration/Apiration after swallow;Pharyngeal residue - valleculae;Pharyngeal residue - pyriform;Lateral channel residue Pharyngeal Material does not  enter airway;Material enters airway, remains ABOVE vocal cords then ejected out;Material enters airway, remains ABOVE vocal cords and not ejected out Pharyngeal- Thin Straw Delayed  swallow initiation-pyriform sinuses;Reduced airway/laryngeal closure;Penetration/Aspiration during swallow;Penetration/Apiration after swallow;Trace aspiration;Pharyngeal residue - pyriform;Lateral channel residue Pharyngeal Material does not enter airway;Material enters airway, remains ABOVE vocal cords then ejected out;Material enters airway, remains ABOVE vocal cords and not ejected out;Material enters airway, passes BELOW cords then ejected out Pharyngeal- Puree Delayed swallow initiation-vallecula;Pharyngeal residue - valleculae;Reduced tongue base retraction;Pharyngeal residue - cp segment Pharyngeal -- Pharyngeal- Mechanical Soft Delayed swallow initiation-pyriform sinuses;Penetration/Apiration after swallow;Pharyngeal residue - valleculae;Pharyngeal residue - pyriform Pharyngeal Material enters airway, CONTACTS cords and not ejected out Pharyngeal- Regular Delayed swallow initiation-pyriform sinuses;Pharyngeal residue - valleculae;Pharyngeal residue - pyriform Pharyngeal -- Pharyngeal- Multi-consistency -- Pharyngeal -- Pharyngeal- Pill -- Pharyngeal -- Pharyngeal Comment --  CHL IP CERVICAL ESOPHAGEAL PHASE 04/08/2017 Cervical Esophageal Phase WFL Pudding Teaspoon -- Pudding Cup -- Honey Teaspoon -- Honey Cup -- Nectar Teaspoon -- Nectar Cup -- Nectar Straw -- Thin Teaspoon -- Thin Cup -- Thin Straw -- Puree -- Mechanical Soft -- Regular -- Multi-consistency -- Pill -- Cervical Esophageal Comment -- Thank you, Havery MorosDabney Porter, CCC-SLP [phone] No flowsheet data found. PORTER,DABNEY 04/08/2017, 3:04 PM               Microbiology: Recent Results (from the past 240 hour(s))  Urine culture     Status: None   Collection Time: 04/06/17  4:30 PM  Result Value Ref Range Status   Specimen Description URINE, CATHETERIZED  Final   Special Requests NONE  Final   Culture   Final    NO GROWTH Performed at [facility], [address] [place], [place] [address]    Report Status 04/08/2017 FINAL   Final  MRSA PCR Screening     Status: None   Collection Time: 04/06/17 10:40 PM  Result Value Ref Range Status   MRSA by PCR NEGATIVE NEGATIVE Final    Comment:        The GeneXpert MRSA Assay (FDA approved for NASAL specimens only), is one component of a comprehensive MRSA colonization surveillance program. It is not intended to diagnose MRSA infection nor to guide or monitor treatment for MRSA infections.      Labs: Basic Metabolic Panel: Recent Labs  Lab 04/06/17 1629 04/06/17 1900 04/07/17 0606 04/08/17 0558 04/09/17 0521  NA 145  --  142 143 142  K 3.8  --  3.9 3.7 3.9  CL 105  --  104 105 102  CO2  --   --  29 30 31   GLUCOSE 98  --  99 100* 105*  BUN 42*  --  44* 40* 34*  CREATININE 1.60*  --  1.81* 1.67* 1.45*  CALCIUM  --   --  9.3 9.0 9.0  MG  --  2.0  --   --  1.7  PHOS  --   --  3.8  --   --    Liver Function Tests: Recent Labs  Lab 04/06/17 1615 04/07/17 0606  AST 29 24  ALT 18 17  ALKPHOS 77 67  BILITOT 1.3* 1.4*  PROT 6.5 6.2*  ALBUMIN 3.2* 3.1*   No results for input(s): LIPASE, AMYLASE in the last 168 hours. Recent Labs  Lab 04/06/17 1615  AMMONIA 21   CBC: Recent Labs  Lab 04/06/17 1615 04/06/17 1629 04/07/17 0606 04/08/17 0558 04/09/17 0521  WBC 9.4  --  9.5 8.5 8.6  NEUTROABS  7.3  --  7.4 6.6 6.8  HGB 16.6 16.3 16.4 16.6 16.2  HCT 53.1* 48.0 53.3* 54.8* 52.6*  MCV 101.1*  --  101.5* 102.4* 102.1*  PLT 151  --  168 156 147*   Cardiac Enzymes: No results for input(s): CKTOTAL, CKMB, CKMBINDEX, TROPONINI in the last 168 hours. BNP: BNP (last 3 results) Recent Labs    04/06/17 1630  BNP 1,237.0*    ProBNP (last 3 results) No results for input(s): PROBNP in the last 8760 hours.  CBG: Recent Labs  Lab 04/07/17 2013  GLUCAP 106*       Signed:  Chaya Jan  [facility] Pager: [phone] 04/09/2017, 5:28 PM

## 2017-04-09 NOTE — Progress Notes (Signed)
Pt doesn't need BIPAP for sleep. Pt is in no distress

## 2017-04-09 NOTE — Progress Notes (Signed)
Pts condom catheter has came off several times and has been replaced x3 so far this shift with no success.  3 incontinent episodes unable to be measured. Will continue to monitor pt

## 2017-04-09 NOTE — Clinical Social Work Note (Signed)
Facilty notified of discharge.  Daughter in law notified of discharge. LCSW signing off.    Conner Muegge, Juleen ChinaHeather D, LCSW

## 2017-04-09 NOTE — [place] FL2 (Signed)
[place] MEDICAID FL2 LEVEL OF CARE SCREENING TOOL     IDENTIFICATION  Patient Name: Daniel Brown Birthdate: 02/20/1928 Sex: male Admission Date (Current Location): 04/06/2017  [facility] [place]   Facility and Address:  [facility],  [address], [place] [id number]      Provider Number: [id number]  Attending Physician Name and Address:  Philip AspenHernandez Acosta, Minerva EndsEstela*  Relative Name and Phone Number:       Current Level of Care: Hospital Recommended Level of Care: Skilled Nursing Facility Prior Approval Number:    Date Approved/Denied: 03/02/17 PASRR Number: [id number]  Discharge Plan: SNF    Current Diagnoses: Patient Active Problem List   Diagnosis Date Noted  . Acute respiratory failure with hypercapnia (HCC) 04/06/2017  . Aspiration pneumonia due to vomit (HCC) 04/06/2017  . Acute systolic congestive heart failure (HCC) 04/06/2017  . Pressure injury of skin 02/24/2017  . Non-traumatic rhabdomyolysis   . AKI (acute kidney injury) (HCC)   . Choledocholithiasis 02/23/2017  . ARF (acute renal failure) (HCC) 02/23/2017  . UTI (urinary tract infection) 02/23/2017  . Elevated troponin 02/23/2017  . Paroxysmal atrial fibrillation (HCC) 10/17/2015  . Therapeutic drug monitoring 10/17/2015  . Pacemaker-St.Jude 02/24/2012  . Complete heart block (HCC) 01/20/2011  . Chronic systolic dysfunction of left ventricle 09/23/2010  . Long term current use of anticoagulant 07/30/2010  . Coronary atherosclerosis 07/04/2009  . Hyperlipidemia 07/03/2009  . Essential hypertension 07/03/2009  . Atrial fibrillation (HCC) 07/03/2009  . CHF 07/03/2009  . COPD (chronic obstructive pulmonary disease) (HCC) 07/03/2009  . SEIZURE DISORDER 07/03/2009    Orientation RESPIRATION BLADDER Height & Weight     Self  O2(2L) External catheter Weight: 192 lb 3.2 oz (87.2 kg) Height:  6' (182.9 cm)  BEHAVIORAL SYMPTOMS/MOOD NEUROLOGICAL BOWEL NUTRITION  STATUS      Continent    AMBULATORY STATUS COMMUNICATION OF NEEDS Skin   Limited Assist Verbally PU Stage and Appropriate Care                       Personal Care Assistance Level of Assistance  Bathing, Feeding, Dressing Bathing Assistance: Independent Feeding assistance: Independent Dressing Assistance: Limited assistance     Functional Limitations Info  Sight, Hearing, Speech Sight Info: Adequate Hearing Info: Adequate Speech Info: Impaired(Doesn't have his dentures here)    SPECIAL CARE FACTORS FREQUENCY  PT (By licensed PT)                    Contractures Contractures Info: Not present    Additional Factors Info  Code Status Code Status Info: DNR             Current Medications (04/09/2017):  This is the current hospital active medication list Current Facility-Administered Medications  Medication Dose Route Frequency Provider Last Rate Last Dose  . acetaminophen (TYLENOL) tablet 650 mg  650 mg Oral Q6H PRN Bobette Mortiz, David Manuel, MD       Or  . acetaminophen (TYLENOL) suppository 650 mg  650 mg Rectal Q6H PRN Bobette Mortiz, David Manuel, MD      . albuterol (PROVENTIL) (2.5 MG/3ML) 0.083% nebulizer solution 2.5 mg  2.5 mg Nebulization Q4H PRN Bobette Mortiz, David Manuel, MD      . Ampicillin-Sulbactam (UNASYN) 3 g in sodium chloride 0.9 % 100 mL IVPB  3 g Intravenous Q6H Bobette Mortiz, David Manuel, MD   Stopped at 04/09/17 1244  . carvedilol (COREG) tablet 3.125 mg  3.125 mg Oral  BID Johnson, Clanford L, MD   3.125 mg at 04/09/17 0933  . furosemide (LASIX) injection 40 mg  40 mg Intravenous Q12H Johnson, Clanford L, MD   40 mg at 04/09/17 0631  . ipratropium-albuterol (DUONEB) 0.5-2.5 (3) MG/3ML nebulizer solution 3 mL  3 mL Nebulization TID Hernandez Acosta, Estela Y, MD      . isosorbide mononitrate (IMDUR) 24 hr tablet 15 mg  15 mg Oral Daily Johnson, Clanford L, MD   15 mg at 04/09/17 0933  . levETIRAcetam (KEPPRA) tablet 250 mg  250 mg Oral BID Johnson, Clanford L, MD   250  mg at 04/09/17 0933  . nitroGLYCERIN (NITROSTAT) SL tablet 0.4 mg  0.4 mg Sublingual PRN Johnson, Clanford L, MD      . ondansetron (ZOFRAN) tablet 4 mg  4 mg Oral Q6H PRN Ortiz, David Manuel, MD       Or  . ondansetron (ZOFRAN) injection 4 mg  4 mg Intravenous Q6H PRN Ortiz, David Manuel, MD      . Rivaroxaban (XARELTO) tablet 15 mg  15 mg Oral Daily Johnson, Clanford L, MD   15 mg at 04/09/17 0933     Discharge Medications: Please see discharge summary for a list of discharge medications.  Relevant Imaging Results:  Relevant Lab Results:   Additional Information    Adelfa Lozito, LCSW    

## 2017-04-09 NOTE — Progress Notes (Signed)
[place] called to [facility] [place] staff member Paediatric nurseTasha RN.  [place] [facility]

## 2017-04-09 NOTE — Progress Notes (Signed)
Upon entering pts room, pts oxygen is off and pt is refusing to wear oxygen. When RN attempted to place oxygen back on pt, he screamed, "LEAVE ME ALONE. I wish you would just leave me alone." RN educated pt that he needed his oxygen d/t his COPD and wheezing, it was helping him breathe. Pt yelled that he didn't care and swatted at nurse. When RN attempted to get pts pulse ox, he refused and started yelling again. RT made aware RN and NT had to hold pts arm down to hook him up to his antibiotics. Pt yelled at staff and told the NT "go play with yourself." Pt re-educated on purpose of medication and he was still refusing. IV abx given as well as lasix through IV. Will continue to monitor pt.

## 2017-04-09 NOTE — NC FL2 (Deleted)
[place] MEDICA[place] FL2 LEVEL OF CARE SCREENING TOOL     IDENTIFICATION  Patient Name: Daniel Brown Birthdate: 02/20/1928 Sex: male Admission Date (Current Location): 04/06/2017  [facility] [place]  [place]   Facility and Address:  [facility],  [address], [place] [id number]      Provider Number: [id number]  Attending Physician Name and Address:  Philip AspenHernandez Acosta, Minerva EndsEstela*  Relative Name and Phone Number:       Current Level of Care: Hospital Recommended Level of Care: Skilled Nursing Facility Prior Approval Number:    Date Approved/Denied: 03/02/17 PASRR Number: [id number]  Discharge Plan: SNF    Current Diagnoses: Patient Active Problem List   Diagnosis Date Noted  . Acute respiratory failure with hypercapnia (HCC) 04/06/2017  . Aspiration pneumonia due to vomit (HCC) 04/06/2017  . Acute systolic congestive heart failure (HCC) 04/06/2017  . Pressure injury of skin 02/24/2017  . Non-traumatic rhabdomyolysis   . AKI (acute kidney injury) (HCC)   . Choledocholithiasis 02/23/2017  . ARF (acute renal failure) (HCC) 02/23/2017  . UTI (urinary tract infection) 02/23/2017  . Elevated troponin 02/23/2017  . Paroxysmal atrial fibrillation (HCC) 10/17/2015  . Therapeutic drug monitoring 10/17/2015  . Pacemaker-St.Jude 02/24/2012  . Complete heart block (HCC) 01/20/2011  . Chronic systolic dysfunction of left ventricle 09/23/2010  . Long term current use of anticoagulant 07/30/2010  . Coronary atherosclerosis 07/04/2009  . Hyperlipidemia 07/03/2009  . Essential hypertension 07/03/2009  . Atrial fibrillation (HCC) 07/03/2009  . CHF 07/03/2009  . COPD (chronic obstructive pulmonary disease) (HCC) 07/03/2009  . SEIZURE DISORDER 07/03/2009    Orientation RESPIRATION BLADDER Height & Weight     Self  O2(2L) External catheter Weight: 192 lb 3.2 oz (87.2 kg) Height:  6' (182.9 cm)  BEHAVIORAL SYMPTOMS/MOOD NEUROLOGICAL BOWEL NUTRITION  STATUS      Continent    AMBULATORY STATUS COMMUNICATION OF NEEDS Skin   Limited Assist Verbally PU Stage and Appropriate Care                       Personal Care Assistance Level of Assistance  Bathing, Feeding, Dressing Bathing Assistance: Independent Feeding assistance: Independent Dressing Assistance: Limited assistance     Functional Limitations Info  Sight, Hearing, Speech Sight Info: Adequate Hearing Info: Adequate Speech Info: Impaired(Doesn't have his dentures here)    SPECIAL CARE FACTORS FREQUENCY  PT (By licensed PT)                    Contractures Contractures Info: Not present    Additional Factors Info  Code Status Code Status Info: DNR             Current Medications (04/09/2017):  This is the current hospital active medication list Current Facility-Administered Medications  Medication Dose Route Frequency Provider Last Rate Last Dose  . acetaminophen (TYLENOL) tablet 650 mg  650 mg Oral Q6H PRN Bobette Mortiz, David Manuel, MD       Or  . acetaminophen (TYLENOL) suppository 650 mg  650 mg Rectal Q6H PRN Bobette Mortiz, David Manuel, MD      . albuterol (PROVENTIL) (2.5 MG/3ML) 0.083% nebulizer solution 2.5 mg  2.5 mg Nebulization Q4H PRN Bobette Mortiz, David Manuel, MD      . Ampicillin-Sulbactam (UNASYN) 3 g in sodium chloride 0.9 % 100 mL IVPB  3 g Intravenous Q6H Bobette Mortiz, David Manuel, MD   Stopped at 04/09/17 1244  . carvedilol (COREG) tablet 3.125 mg  3.125 mg Oral  BID Laural BenesJohnson, Clanford L, MD   3.125 mg at 04/09/17 0933  . furosemide (LASIX) injection 40 mg  40 mg Intravenous Q12H Johnson, Clanford L, MD   40 mg at 04/09/17 0631  . ipratropium-albuterol (DUONEB) 0.5-2.5 (3) MG/3ML nebulizer solution 3 mL  3 mL Nebulization TID Philip AspenHernandez Acosta, Limmie PatriciaEstela Y, MD      . isosorbide mononitrate (IMDUR) 24 hr tablet 15 mg  15 mg Oral Daily Johnson, Clanford L, MD   15 mg at 04/09/17 0933  . levETIRAcetam (KEPPRA) tablet 250 mg  250 mg Oral BID Laural BenesJohnson, Clanford L, MD   250  mg at 04/09/17 0933  . nitroGLYCERIN (NITROSTAT) SL tablet 0.4 mg  0.4 mg Sublingual PRN Laural BenesJohnson, Clanford L, MD      . ondansetron (ZOFRAN) tablet 4 mg  4 mg Oral Q6H PRN Bobette Mortiz, David Manuel, MD       Or  . ondansetron [facility](ZOFRAN) injection 4 mg  4 mg Intravenous Q6H PRN Bobette Mortiz, David Manuel, MD      . Rivaroxaban Carlena Hurl(XARELTO) tablet 15 mg  15 mg Oral Daily Laural BenesJohnson, Clanford L, MD   15 mg at 04/09/17 [id number]     Discharge Medications: Please see discharge summary for a list of discharge medications.  Relevant Imaging Results:  Relevant Lab Results:   Additional Information    Elliot GaultKathleen Laquonda Welby, LCSW

## 2017-04-09 NOTE — Care Management Important Message (Signed)
Important Message  Patient Details  Name: Daniel Brown MRN: [id number] Date of Birth: 06/20/1927   Medicare Important Message Given:  Yes notice given at 1200.     Malcolm Metrohildress, Shawntavia Saunders Demske, RN 04/09/2017, 6:42 PM

## 2017-04-09 NOTE — Progress Notes (Signed)
Discharged via EMS

## 2017-04-10 ENCOUNTER — Inpatient Hospital Stay (HOSPITAL_COMMUNITY)
Admission: EM | Admit: 2017-04-10 | Discharge: 2017-04-13 | DRG: 291 | Disposition: A | Discharge status: Skilled Nursing Facility | Payer: Medicare Other | Attending: Internal Medicine | Admitting: Internal Medicine

## 2017-04-10 ENCOUNTER — Emergency Department (HOSPITAL_COMMUNITY): Payer: Medicare Other

## 2017-04-10 ENCOUNTER — Encounter (HOSPITAL_COMMUNITY): Payer: Self-pay | Admitting: *Deleted

## 2017-04-10 ENCOUNTER — Other Ambulatory Visit: Payer: Self-pay

## 2017-04-10 DIAGNOSIS — I130 Hypertensive heart and chronic kidney disease with heart failure and stage 1 through stage 4 chronic kidney disease, or unspecified chronic kidney disease: Principal | ICD-10-CM | POA: Diagnosis present

## 2017-04-10 DIAGNOSIS — J9622 Acute and chronic respiratory failure with hypercapnia: Secondary | ICD-10-CM | POA: Diagnosis present

## 2017-04-10 DIAGNOSIS — E872 Acidosis: Secondary | ICD-10-CM | POA: Diagnosis present

## 2017-04-10 DIAGNOSIS — I2510 Atherosclerotic heart disease of native coronary artery without angina pectoris: Secondary | ICD-10-CM | POA: Diagnosis present

## 2017-04-10 DIAGNOSIS — J9601 Acute respiratory failure with hypoxia: Secondary | ICD-10-CM | POA: Diagnosis not present

## 2017-04-10 DIAGNOSIS — Z950 Presence of cardiac pacemaker: Secondary | ICD-10-CM | POA: Diagnosis not present

## 2017-04-10 DIAGNOSIS — Z7901 Long term (current) use of anticoagulants: Secondary | ICD-10-CM

## 2017-04-10 DIAGNOSIS — I255 Ischemic cardiomyopathy: Secondary | ICD-10-CM | POA: Diagnosis present

## 2017-04-10 DIAGNOSIS — E785 Hyperlipidemia, unspecified: Secondary | ICD-10-CM | POA: Diagnosis present

## 2017-04-10 DIAGNOSIS — R748 Abnormal levels of other serum enzymes: Secondary | ICD-10-CM | POA: Diagnosis not present

## 2017-04-10 DIAGNOSIS — G9340 Encephalopathy, unspecified: Secondary | ICD-10-CM | POA: Diagnosis present

## 2017-04-10 DIAGNOSIS — I482 Chronic atrial fibrillation: Secondary | ICD-10-CM | POA: Diagnosis present

## 2017-04-10 DIAGNOSIS — I248 Other forms of acute ischemic heart disease: Secondary | ICD-10-CM | POA: Diagnosis present

## 2017-04-10 DIAGNOSIS — J690 Pneumonitis due to inhalation of food and vomit: Secondary | ICD-10-CM | POA: Diagnosis present

## 2017-04-10 DIAGNOSIS — Z8673 Personal history of transient ischemic attack (TIA), and cerebral infarction without residual deficits: Secondary | ICD-10-CM

## 2017-04-10 DIAGNOSIS — Z66 Do not resuscitate: Secondary | ICD-10-CM | POA: Diagnosis present

## 2017-04-10 DIAGNOSIS — I4891 Unspecified atrial fibrillation: Secondary | ICD-10-CM | POA: Diagnosis present

## 2017-04-10 DIAGNOSIS — Z87891 Personal history of nicotine dependence: Secondary | ICD-10-CM

## 2017-04-10 DIAGNOSIS — I509 Heart failure, unspecified: Secondary | ICD-10-CM

## 2017-04-10 DIAGNOSIS — I480 Paroxysmal atrial fibrillation: Secondary | ICD-10-CM | POA: Diagnosis present

## 2017-04-10 DIAGNOSIS — I5023 Acute on chronic systolic (congestive) heart failure: Secondary | ICD-10-CM | POA: Diagnosis present

## 2017-04-10 DIAGNOSIS — J449 Chronic obstructive pulmonary disease, unspecified: Secondary | ICD-10-CM | POA: Diagnosis present

## 2017-04-10 DIAGNOSIS — R4182 Altered mental status, unspecified: Secondary | ICD-10-CM

## 2017-04-10 DIAGNOSIS — N183 Chronic kidney disease, stage 3 unspecified: Secondary | ICD-10-CM | POA: Diagnosis present

## 2017-04-10 DIAGNOSIS — J9621 Acute and chronic respiratory failure with hypoxia: Secondary | ICD-10-CM | POA: Diagnosis present

## 2017-04-10 DIAGNOSIS — Z9049 Acquired absence of other specified parts of digestive tract: Secondary | ICD-10-CM | POA: Diagnosis not present

## 2017-04-10 DIAGNOSIS — R778 Other specified abnormalities of plasma proteins: Secondary | ICD-10-CM | POA: Diagnosis present

## 2017-04-10 DIAGNOSIS — D696 Thrombocytopenia, unspecified: Secondary | ICD-10-CM | POA: Diagnosis present

## 2017-04-10 DIAGNOSIS — R7989 Other specified abnormal findings of blood chemistry: Secondary | ICD-10-CM

## 2017-04-10 DIAGNOSIS — G40909 Epilepsy, unspecified, not intractable, without status epilepticus: Secondary | ICD-10-CM | POA: Diagnosis present

## 2017-04-10 LAB — BLOOD GAS, ARTERIAL
Acid-Base Excess: 3.9 mmol/L — ABNORMAL HIGH (ref 0.0–2.0)
BICARBONATE: 26.4 mmol/L (ref 20.0–28.0)
Drawn by: 274071
O2 Content: 4 L/min
O2 Saturation: 98.5 %
PCO2 ART: 57.8 mmHg — AB (ref 32.0–48.0)
PH ART: 7.328 — AB (ref 7.350–7.450)
PO2 ART: 128 mmHg — AB (ref 83.0–108.0)

## 2017-04-10 LAB — CBC WITH DIFFERENTIAL/PLATELET
BASOS ABS: 0 10*3/uL (ref 0.0–0.1)
Basophils Relative: 0 %
EOS ABS: 0 10*3/uL (ref 0.0–0.7)
EOS PCT: 0 %
HCT: 54.7 % — ABNORMAL HIGH (ref 39.0–52.0)
Hemoglobin: 16.7 g/dL (ref 13.0–17.0)
LYMPHS ABS: 1.1 10*3/uL (ref 0.7–4.0)
LYMPHS PCT: 13 %
MCH: 31.7 pg (ref 26.0–34.0)
MCHC: 30.5 g/dL (ref 30.0–36.0)
MCV: 104 fL — AB (ref 78.0–100.0)
MONO ABS: 0.7 10*3/uL (ref 0.1–1.0)
Monocytes Relative: 8 %
Neutro Abs: 6.3 10*3/uL (ref 1.7–7.7)
Neutrophils Relative %: 79 %
PLATELETS: 130 10*3/uL — AB (ref 150–400)
RBC: 5.26 MIL/uL (ref 4.22–5.81)
RDW: 15.7 % — AB (ref 11.5–15.5)
WBC: 8 10*3/uL (ref 4.0–10.5)

## 2017-04-10 LAB — COMPREHENSIVE METABOLIC PANEL
ALBUMIN: 3.1 g/dL — AB (ref 3.5–5.0)
ALK PHOS: 70 U/L (ref 38–126)
ALT: 18 U/L (ref 17–63)
AST: 24 U/L (ref 15–41)
Anion gap: 5 (ref 5–15)
BILIRUBIN TOTAL: 1.1 mg/dL (ref 0.3–1.2)
BUN: 39 mg/dL — AB (ref 6–20)
CO2: 34 mmol/L — ABNORMAL HIGH (ref 22–32)
CREATININE: 1.6 mg/dL — AB (ref 0.61–1.24)
Calcium: 9.1 mg/dL (ref 8.9–10.3)
Chloride: 101 mmol/L (ref 101–111)
GFR calc Af Amer: 43 mL/min — ABNORMAL LOW (ref >60)
GFR calc non Af Amer: 37 mL/min — ABNORMAL LOW (ref >60)
GLUCOSE: 100 mg/dL — AB (ref 65–99)
POTASSIUM: 4.7 mmol/L (ref 3.5–5.1)
Sodium: 140 mmol/L (ref 135–145)
TOTAL PROTEIN: 6.5 g/dL (ref 6.5–8.1)

## 2017-04-10 LAB — RAPID URINE DRUG SCREEN, HOSP PERFORMED
Amphetamines: NOT DETECTED
BARBITURATES: NOT DETECTED
BENZODIAZEPINES: NOT DETECTED
COCAINE: NOT DETECTED
Opiates: NOT DETECTED
TETRAHYDROCANNABINOL: NOT DETECTED

## 2017-04-10 LAB — URINALYSIS, ROUTINE W REFLEX MICROSCOPIC
BILIRUBIN URINE: NEGATIVE
Glucose, UA: NEGATIVE mg/dL
HGB URINE DIPSTICK: NEGATIVE
Ketones, ur: NEGATIVE mg/dL
Leukocytes, UA: NEGATIVE
Nitrite: NEGATIVE
PH: 5 (ref 5.0–8.0)
Protein, ur: NEGATIVE mg/dL
SPECIFIC GRAVITY, URINE: 1.018 (ref 1.005–1.030)

## 2017-04-10 LAB — TROPONIN I: Troponin I: 0.13 ng/mL (ref <0.03)

## 2017-04-10 LAB — AMMONIA: Ammonia: 24 umol/L (ref 9–35)

## 2017-04-10 LAB — BRAIN NATRIURETIC PEPTIDE: B Natriuretic Peptide: 1957 pg/mL — ABNORMAL HIGH (ref 0.0–100.0)

## 2017-04-10 LAB — CBG MONITORING, ED: Glucose-Capillary: 98 mg/dL (ref 65–99)

## 2017-04-10 MED ORDER — FUROSEMIDE 10 MG/ML IJ SOLN
40.0000 mg | Freq: Once | INTRAMUSCULAR | Status: AC
  Administered 2017-04-10: 40 mg via INTRAVENOUS
  Filled 2017-04-10: qty 4

## 2017-04-10 MED ORDER — SODIUM CHLORIDE 0.9% FLUSH
3.0000 mL | INTRAVENOUS | Status: DC | PRN
  Administered 2017-04-12 – 2017-04-13 (×2): 3 mL via INTRAVENOUS
  Filled 2017-04-10 (×2): qty 3

## 2017-04-10 MED ORDER — CARVEDILOL 3.125 MG PO TABS
3.1250 mg | ORAL_TABLET | Freq: Two times a day (BID) | ORAL | Status: DC
  Administered 2017-04-12 – 2017-04-13 (×3): 3.125 mg via ORAL
  Filled 2017-04-10 (×3): qty 1

## 2017-04-10 MED ORDER — EZETIMIBE 10 MG PO TABS
10.0000 mg | ORAL_TABLET | Freq: Every day | ORAL | Status: DC
Start: 2017-04-11 — End: 2017-04-13
  Administered 2017-04-11 – 2017-04-13 (×3): 10 mg via ORAL
  Filled 2017-04-10 (×3): qty 1

## 2017-04-10 MED ORDER — LISINOPRIL 5 MG PO TABS
2.5000 mg | ORAL_TABLET | Freq: Every day | ORAL | Status: DC
  Administered 2017-04-12 – 2017-04-13 (×2): 2.5 mg via ORAL
  Filled 2017-04-10 (×2): qty 1

## 2017-04-10 MED ORDER — ONDANSETRON HCL 4 MG/2ML IJ SOLN: 4.0000 mg | Freq: Four times a day (QID) | INTRAMUSCULAR | Status: DC | PRN

## 2017-04-10 MED ORDER — ALBUTEROL SULFATE (2.5 MG/3ML) 0.083% IN NEBU
2.5000 mg | INHALATION_SOLUTION | Freq: Once | RESPIRATORY_TRACT | Status: AC
  Administered 2017-04-10: 2.5 mg via RESPIRATORY_TRACT
  Filled 2017-04-10: qty 3

## 2017-04-10 MED ORDER — FUROSEMIDE 10 MG/ML IJ SOLN
40.0000 mg | Freq: Two times a day (BID) | INTRAMUSCULAR | Status: DC
  Administered 2017-04-11 – 2017-04-13 (×5): 40 mg via INTRAVENOUS
  Filled 2017-04-10 (×5): qty 4

## 2017-04-10 MED ORDER — LEVETIRACETAM 250 MG PO TABS
250.0000 mg | ORAL_TABLET | Freq: Two times a day (BID) | ORAL | Status: DC
  Administered 2017-04-10 – 2017-04-13 (×5): 250 mg via ORAL
  Filled 2017-04-10 (×5): qty 1

## 2017-04-10 MED ORDER — AMPICILLIN-SULBACTAM SODIUM 3 (2-1) G IJ SOLR
3.0000 g | Freq: Once | INTRAMUSCULAR | Status: AC
  Administered 2017-04-10: 3 g via INTRAVENOUS
  Filled 2017-04-10: qty 3

## 2017-04-10 MED ORDER — VITAMIN D 1000 UNITS PO TABS
2000.0000 [IU] | ORAL_TABLET | Freq: Every day | ORAL | Status: DC
  Administered 2017-04-12 – 2017-04-13 (×2): 2000 [IU] via ORAL
  Filled 2017-04-10 (×2): qty 2

## 2017-04-10 MED ORDER — RIVAROXABAN 20 MG PO TABS
20.0000 mg | ORAL_TABLET | Freq: Every day | ORAL | Status: DC
  Administered 2017-04-11 – 2017-04-13 (×3): 20 mg via ORAL
  Filled 2017-04-10 (×3): qty 1

## 2017-04-10 MED ORDER — ACETAMINOPHEN 325 MG PO TABS
650.0000 mg | ORAL_TABLET | ORAL | Status: DC | PRN
  Administered 2017-04-12: 650 mg via ORAL
  Filled 2017-04-10: qty 2

## 2017-04-10 MED ORDER — ISOSORBIDE MONONITRATE ER 30 MG PO TB24
30.0000 mg | ORAL_TABLET | Freq: Every day | ORAL | Status: DC
  Administered 2017-04-12 – 2017-04-13 (×2): 30 mg via ORAL
  Filled 2017-04-10 (×2): qty 1

## 2017-04-10 MED ORDER — SODIUM CHLORIDE 0.9% FLUSH
3.0000 mL | Freq: Two times a day (BID) | INTRAVENOUS | Status: DC
  Administered 2017-04-10 – 2017-04-13 (×5): 3 mL via INTRAVENOUS

## 2017-04-10 MED ORDER — SODIUM CHLORIDE 0.9 % IV SOLN
3.0000 g | Freq: Three times a day (TID) | INTRAVENOUS | Status: DC
  Administered 2017-04-11 – 2017-04-13 (×7): 3 g via INTRAVENOUS
  Filled 2017-04-10 (×11): qty 3

## 2017-04-10 MED ORDER — SODIUM CHLORIDE 0.9 % IV SOLN: 250.0000 mL | INTRAVENOUS | Status: DC | PRN

## 2017-04-10 NOTE — H&P (Signed)
History and Physical    Daniel Gemmaugene P Bernath ZOX:[id number] DOB: 06/22/1927 DOA: 04/10/2017  PCP: [name], [name], MD (Inactive)   Patient coming from: SNF   Chief Complaint: AMS   HPI: Daniel [name] is a 83 y.o. male with medical history significant for coronary artery disease, atrial fibrillation on Xarelto, chronic systolic CHF, complete heart block with pacer, seizure disorder, and chronic kidney disease stage III, now presenting to the emergency department for evaluation of decreased responsiveness.  Patient was just discharged from the hospital yesterday after an admission with altered mental status, during which she was found to have had an aspiration pneumonia, acute systolic CHF, and acute hypercapnic respiratory failure.  Patient was treated with Unasyn and discharged on Augmentin, diuresed, started on 2 L/min of supplemental oxygen, and discharged to an SNF in improved and stable condition.  Tonight, he was noted by SNF personnel to be lethargic and was said to be responding only to painful stimuli.  He was transported to the hospital for evaluation of this.  Family at the bedside reports that he had nearly returned to baseline mental status by time of discharge yesterday, but he is now much more lethargic and less responsive than usual.  ED Course: Upon arrival to the ED, patient is found to have a temp of 37.7 degree C, requiring 4 L/min of supplemental oxygen in order to maintain saturations in the 90s, tachypnea, and with stable blood pressure.  EKG features atrial fibrillation with ventricular pacing.  Chest x-ray findings are consistent with worsening CHF.  Noncontrast head CT is negative for acute intracranial abnormality.  Chemistry panel reveals a BUN of 39 and creatinine of 1.60, improved from 1.8 thr days earlier, but up from 1.2 last month.  CBC is notable for a mild thrombocytopenia with platelets 130,000.  BNP is elevated at 1957 troponin is elevated to 0.13.  UDS is negative and  urinalysis is unremarkable.  Patient was treated with albuterol neb and 40 mg IV Lasix in the ED.  He remained hemodynamically stable, but disoriented and  slightly dyspneic.  He will be admitted to the telemetry unit for ongoing evaluation and management of acute encephalopathy and acute on chronic systolic CHF with acute on chronic hypercapnic respiratory failure.  Review of Systems:  Unable to complete ROS secondary to patient's clinical condition with acute encephalopathy.   Past Medical History:  Diagnosis Date  . CAD (coronary artery disease)    by cath 1/11, medical management advised  . Cardiomyopathy, ischemic    EF 40% by echo 1/11  . Complete heart block (HCC)    s/p SJM PPM by JSA 01/10/11  . COPD (chronic obstructive pulmonary disease) (HCC)   . CVD (cardiovascular disease)    s/p MCA stroke 1/11  . HTN (hypertension)   . Hyperlipidemia   . Permanent atrial fibrillation (HCC)   . Seizure disorder (HCC)   . Stroke [facility](HCC)     Past Surgical History:  Procedure Laterality Date  . CHOLECYSTECTOMY    . ERCP N/A 02/26/2017   Procedure: ENDOSCOPIC RETROGRADE CHOLANGIOPANCREATOGRAPHY (ERCP);  Surgeon: Vida RiggerMagod, Marc, MD;  [facility];  Service: Endoscopy;  Laterality: N/A;  . PACEMAKER INSERTION  01/10/11   by Fawn KirkJA for CHB     reports that he has quit smoking. he has never used smokeless tobacco. He reports that he does not drink alcohol or use drugs.  No Known Allergies  Family History  Problem Relation Age of Onset  . Colon cancer Neg Hx  Prior to Admission medications   Medication Sig Start Date End Date Taking? Authorizing Provider  amoxicillin-clavulanate (AUGMENTIN) 500-125 MG tablet Take 1 tablet (500 mg total) by mouth 3 (three) times daily. 04/09/17  Yes Philip AspenHernandez Acosta, Limmie PatriciaEstela Y, MD  carvedilol (COREG) 3.125 MG tablet Take 3.125 mg by mouth 2 (two) times daily.    Yes [provider]  Cholecalciferol (VITAMIN D) 2000 UNITS CAPS Take 2,000  Units by mouth daily.    Yes [provider]  ezetimibe (ZETIA) 10 MG tablet Take 10 mg by mouth daily.   Yes [provider]  furosemide (LASIX) 40 MG tablet Take 1 tablet (40 mg total) by mouth daily. 04/09/17  Yes Philip AspenHernandez Acosta, Limmie PatriciaEstela Y, MD  isosorbide mononitrate (IMDUR) 30 MG 24 hr tablet Take 30 mg by mouth daily.    Yes [provider]  levETIRAcetam (KEPPRA) 250 MG tablet Take 250 mg by mouth 2 (two) times daily.    Yes [provider]  lisinopril (PRINIVIL,ZESTRIL) 2.5 MG tablet Take 1 tablet (2.5 mg total) by mouth daily. 01/13/17  Yes Allred, Fayrene FearingJames, MD  NITROSTAT 0.4 MG SL tablet Place 0.4 mg under the tongue as needed for chest pain.  01/17/11  Yes [provider]  XARELTO 20 MG TABS tablet TAKE 1 TABLET BY MOUTH  DAILY 08/25/16  Yes Allred, Fayrene FearingJames, MD    Physical Exam: Vitals:   04/10/17 1818 04/10/17 1829 04/10/17 1830 04/10/17 2000  BP:   112/82 108/86  Pulse:   65 73  Resp:   (!) 24 11  Temp: 99.8 F (37.7 C)     TempSrc: Rectal     SpO2:  98% 98% 99%  Weight:      Height:          Constitutional: Not in acute distress, mild tachypnea Eyes: PERTLA, lids and conjunctivae normal ENMT: Mucous membranes are moist. Posterior pharynx clear of any exudate or lesions.   Neck: normal, supple, no masses, no thyromegaly Respiratory: Mild tachypnea, bilateral rales, no pallor. No accessory muscle use.  Cardiovascular: Rate ~60 and irregular. Bilateral LE edema to thighs, bilateral UE edema. JVP 9 cm H2O. Abdomen: No distension, no tenderness, no masses palpated. Bowel sounds normal.  Musculoskeletal: no clubbing / cyanosis. No joint deformity upper and lower extremities.  Skin: no significant rashes, lesions, ulcers. Warm, dry, well-perfused. Neurologic: PERRL, no facial asymmetry. Patellar DTR normal. Moving all extremities. Lethargic, opens eyes briefly to verbal stimuli.  Psychiatric: Difficult to assess given the clinical  circumstances.     Labs on Admission: I have personally reviewed following labs and imaging studies  CBC: Recent Labs  Lab 04/06/17 1615 04/06/17 1629 04/07/17 0606 04/08/17 0558 04/09/17 0521 04/10/17 1825  WBC 9.4  --  9.5 8.5 8.6 8.0  NEUTROABS 7.3  --  7.4 6.6 6.8 6.3  HGB 16.6 16.3 16.4 16.6 16.2 16.7  HCT 53.1* 48.0 53.3* 54.8* 52.6* 54.7*  MCV 101.1*  --  101.5* 102.4* 102.1* 104.0*  PLT 151  --  168 156 147* 130*   Basic Metabolic Panel: Recent Labs  Lab 04/06/17 1629 04/06/17 1900 04/07/17 0606 04/08/17 0558 04/09/17 0521 04/10/17 1825  NA 145  --  142 143 142 140  K 3.8  --  3.9 3.7 3.9 4.7  CL 105  --  104 105 102 101  CO2  --   --  29 30 31  34*  GLUCOSE 98  --  99 100* 105* 100*  BUN 42*  --  44* 40* 34* 39*  CREATININE 1.60*  --  1.81* 1.67* 1.45* 1.60*  CALCIUM  --   --  9.3 9.0 9.0 9.1  MG  --  2.0  --   --  1.7  --   PHOS  --   --  3.8  --   --   --    GFR: Estimated Creatinine Clearance: 35.7 mL/min (A) (by C-G formula based on SCr of 1.6 mg/dL (H)). Liver Function Tests: Recent Labs  Lab 04/06/17 1615 04/07/17 0606 04/10/17 1825  AST 29 24 24   ALT 18 17 18   ALKPHOS 77 67 70  BILITOT 1.3* 1.4* 1.1  PROT 6.5 6.2* 6.5  ALBUMIN 3.2* 3.1* 3.1*   No results for input(s): LIPASE, AMYLASE in the last 168 hours. Recent Labs  Lab 04/06/17 1615 04/10/17 1825  AMMONIA 21 24   Coagulation Profile: No results for input(s): INR, PROTIME in the last 168 hours. Cardiac Enzymes: Recent Labs  Lab 04/10/17 1825  TROPONINI 0.13*   BNP (last 3 results) No results for input(s): PROBNP in the last 8760 hours. HbA1C: No results for input(s): HGBA1C in the last 72 hours. CBG: Recent Labs  Lab 04/07/17 2013 04/10/17 1843  GLUCAP 106* 98   Lipid Profile: No results for input(s): CHOL, HDL, LDLCALC, TRIG, CHOLHDL, LDLDIRECT in the last 72 hours. Thyroid Function Tests: No results for input(s): TSH, T4TOTAL, FREET4, T3FREE, THYROIDAB in the  last 72 hours. Anemia Panel: No results for input(s): VITAMINB12, FOLATE, FERRITIN, TIBC, IRON, RETICCTPCT in the last 72 hours. Urine analysis:    Component Value Date/Time   COLORURINE YELLOW 04/06/2017 1630   APPEARANCEUR CLEAR 04/06/2017 1630   LABSPEC 1.017 04/06/2017 1630   PHURINE 5.0 04/06/2017 1630   GLUCOSEU NEGATIVE 04/06/2017 1630   HGBUR MODERATE (A) 04/06/2017 1630   BILIRUBINUR NEGATIVE 04/06/2017 1630   KETONESUR NEGATIVE 04/06/2017 1630   PROTEINUR NEGATIVE 04/06/2017 1630   UROBILINOGEN 0.2 09/13/2012 1958   NITRITE NEGATIVE 04/06/2017 1630   LEUKOCYTESUR NEGATIVE 04/06/2017 1630   Sepsis Labs: @LABRCNTIP (procalcitonin:4,lacticidven:4) ) Recent Results (from the past 240 hour(s))  Urine culture     Status: None   Collection Time: 04/06/17  4:30 PM  Result Value Ref Range Status   Specimen Description URINE, CATHETERIZED  Final   Special Requests NONE  Final   Culture   Final    NO GROWTH Performed at [facility], [address], [place], [place] [address]    Report Status 04/08/2017 FINAL  Final  MRSA PCR Screening     Status: None   Collection Time: 04/06/17 10:40 PM  Result Value Ref Range Status   MRSA by PCR NEGATIVE NEGATIVE Final    Comment:        The GeneXpert MRSA Assay (FDA approved for NASAL specimens only), is one component of a comprehensive MRSA colonization surveillance program. It is not intended to diagnose MRSA infection nor to guide or monitor treatment for MRSA infections.      Radiological Exams on Admission: Ct Head Wo Contrast  Result Date: 04/10/2017 CLINICAL DATA:  Altered level of consciousness. EXAM: CT HEAD WITHOUT CONTRAST TECHNIQUE: Contiguous axial images were obtained from the base of the skull through the vertex without intravenous contrast. COMPARISON:  Four days prior FINDINGS: Brain: No evidence of acute infarction, hemorrhage, hydrocephalus, extra-axial collection or mass lesion/mass effect. Remote  left insular infarct. Generalized atrophy. Vascular: Atherosclerotic calcification. No gross hyperdense vessel. Skull: No acute or aggressive finding. Sinuses/Orbits: Bilateral  cataract resection.  No acute finding. IMPRESSION: 1. Motion degraded study without acute finding. 2. Atrophy and remote left insular infarct. Electronically Signed   By: Marnee Spring M.D.   On: 04/10/2017 19:13   Dg Chest Portable 1 View  Result Date: 04/10/2017 CLINICAL DATA:  Altered mental status. Hypoxia today. Cardiomyopathy. EXAM: PORTABLE CHEST 1 VIEW COMPARISON:  04/08/2017. FINDINGS: Enlarged cardiac silhouette without significant change. Mild increase in prominence of the pulmonary vasculature and interstitial markings. Increased patchy opacity at both lung bases with probable bilateral pleural effusions. Stable left subclavian pacemaker lead. Thoracic spine degenerative changes. IMPRESSION: Worsening changes of congestive heart failure. Electronically Signed   By: Beckie Salts M.D.   On: 04/10/2017 18:58    EKG: Independently reviewed. Atrial fibrillation, ventricularl-pacing.  Assessment/Plan  1. Acute encephalopathy  - Pt presents from SNF with lethargy and disorientation  - No focal neurologic deficits noted and head CT negative for acute intracranial abnormality  - UA unremarkable; ammonia level wnl  - Hypercarbia could be contributing - Plan to check TSH, RPR, B12, folate levels  - Continue to treat acute issues as below, continue supportive care    2. Acute on chronic systolic CHF; acute on chronic respiratory failure with hypercapnia  - Pt presents with AMS, required increase in supplemental O2 to 4 Lpm d/t sat in 80's with EMS  - ABG with pH 7.33, pCO2 58  - Noted to have peripheral edema, CXR findings consistent with worsening CHF, and dyspnea  - Given 40 mg IV Lasix in ED - Plan to continue cardiac monitoring, SLIV, follow daily wts and I/O's, fluid-restrict diet, continue Lasix 40 mg IV q12h,  continue Coreg and low-dose lisinopril, follow daily BMP - Echo done earlier this week   3. CKD stage III  - SCr is 1.60 on admission, improved from recent admission, but up from 1.2 in October '18  - Plan to follow daily chem panel during IV diuresis, renally-dose medications    4. Atrial fibrillation  - In rate-controlled a fib on admission  - CHADS-VASc is 45 (age x2, CVA x2, CAD, CHF)  - Continue Xarelto    5. Seizure disorder  - No evidence for seizure on admission  - Continue suppression with Keppra    6. Elevated troponin  - No anginal complaints, likely demand ischemia in setting of acute CHF and renal insufficiency  - Continue cardiac monitoring, trend troponin, continue beta-blocker and ACE, continue Imdur   7. Aspiration PNA  - Pt was treated with Unasyn in hospital 11/26-11/29 and discharged with Augmentin  - Plan to resume Unasyn while altered and not appropriate for oral intake, resume Augmentin when appropriate   DVT prophylaxis: Xarelto  Code Status: DNR Family Communication: Family updated at bedside Disposition Plan: Admit to telemetry Consults called: None Admission status: Inpatient    Briscoe Deutscher, MD [facility] Pager [phone]  If 7PM-7AM, please contact night-coverage [email or web address] Password [email or web address]  04/10/2017, 8:52 PM

## 2017-04-10 NOTE — ED Notes (Signed)
Patient transported to CT 

## 2017-04-10 NOTE — ED Notes (Signed)
CRITICAL VALUE ALERT  Critical Value:  Trop 0.13  Date & Time Notied:  04/10/2017@19 :35  Provider Notified: Dr Criss AlvineGoldston  Orders Received/Actions taken: no additional orders given,

## 2017-04-10 NOTE — ED Triage Notes (Signed)
Pt brought in by [facility] from [facility]. EMS reports nursing home staff c/o pt had AMS starting today. BP found to be 94/66, HR 60, resp 24, O2 sat 87% on 2L via [place] when EMS arrived. Pt's eyes open and sometimes moans with verbal stimuli, but usually more with painful stimuli. Pt has pitting edema to BUE and BLE.

## 2017-04-10 NOTE — ED Provider Notes (Signed)
[facility] EMERGENCY DEPARTMENT Provider Note   CSN: [id number] Arrival date & time: 04/10/17  1745   LEVEL 5 CAVEAT - ALTERED MENTAL STATUS  History   Chief Complaint Chief Complaint  Patient presents with  . Altered Mental Status    HPI Daniel [name] is a 83 y.o. male.  HPI  83 year old male brought in by EMS for altered mental status.  He was discharged yesterday after being admitted for altered mental status and found to have aspiration pneumonia and CHF.  The patient was not back to himself yesterday but was much improved.  He was able to talk and converse but was still weak.  Wife, who lives with him at the facility, states that he was talking to her sometime this morning.  When family arrived to visit with him around 4 or 5 PM tonight, he was essentially unresponsive.  He has been this way since.  Sternal rubbing him did not seem to wake him up.  The patient is altered and while he does occasionally nod his head yes and no to me, it does not seem reliable and the history is very limited.  EMS notes patient's O2 sats were in the high 80s on 2 L, which is what he has been on since being discharged.  Past Medical History:  Diagnosis Date  . CAD (coronary artery disease)    by cath 1/11, medical management advised  . Cardiomyopathy, ischemic    EF 40% by echo 1/11  . Complete heart block (HCC)    s/p SJM PPM by JSA 01/10/11  . COPD (chronic obstructive pulmonary disease) (HCC)   . CVD (cardiovascular disease)    s/p MCA stroke 1/11  . HTN (hypertension)   . Hyperlipidemia   . Permanent atrial fibrillation (HCC)   . Seizure disorder (HCC)   . Stroke [facility])     Patient Active Problem List   Diagnosis Date Noted  . Acute encephalopathy 04/10/2017  . Acute on chronic respiratory failure with hypercapnia (HCC) 04/06/2017  . Aspiration pneumonia due to vomit (HCC) 04/06/2017  . Pressure injury of skin 02/24/2017  . Non-traumatic rhabdomyolysis   . CKD (chronic kidney  disease), stage III (HCC)   . Choledocholithiasis 02/23/2017  . ARF (acute renal failure) (HCC) 02/23/2017  . UTI (urinary tract infection) 02/23/2017  . Elevated troponin 02/23/2017  . Paroxysmal atrial fibrillation (HCC) 10/17/2015  . Therapeutic drug monitoring 10/17/2015  . Pacemaker-St.Jude 02/24/2012  . Complete heart block (HCC) 01/20/2011  . Chronic systolic dysfunction of left ventricle 09/23/2010  . Long term current use of anticoagulant 07/30/2010  . Coronary atherosclerosis 07/04/2009  . Hyperlipidemia 07/03/2009  . Essential hypertension 07/03/2009  . Acute on chronic systolic CHF (congestive heart failure) (HCC) 07/03/2009  . COPD (chronic obstructive pulmonary disease) (HCC) 07/03/2009  . Seizure disorder (HCC) 07/03/2009    Past Surgical History:  Procedure Laterality Date  . CHOLECYSTECTOMY    . ERCP N/A 02/26/2017   Procedure: ENDOSCOPIC RETROGRADE CHOLANGIOPANCREATOGRAPHY (ERCP);  Surgeon: Vida Rigger, MD;  Location[facility] ENDOSCOPY;  Service: Endoscopy;  Laterality: N/A;  . PACEMAKER INSERTION  01/10/11   by Fawn Kirk for CHB       Home Medications    Prior to Admission medications   Medication Sig Start Date End Date Taking? Authorizing Provider  amoxicillin-clavulanate (AUGMENTIN) 500-125 MG tablet Take 1 tablet (500 mg total) by mouth 3 (three) times daily. 04/09/17  Yes Philip Aspen, Limmie Patricia, MD  carvedilol (COREG) 3.125 MG tablet Take 3.125 mg  by mouth 2 (two) times daily.    Yes [provider]  Cholecalciferol (VITAMIN D) 2000 UNITS CAPS Take 2,000 Units by mouth daily.    Yes [provider]  ezetimibe (ZETIA) 10 MG tablet Take 10 mg by mouth daily.   Yes [provider]  furosemide (LASIX) 40 MG tablet Take 1 tablet (40 mg total) by mouth daily. 04/09/17  Yes Philip Aspen, Limmie Patricia, MD  isosorbide mononitrate (IMDUR) 30 MG 24 hr tablet Take 30 mg by mouth daily.    Yes [provider]  levETIRAcetam (KEPPRA) 250  MG tablet Take 250 mg by mouth 2 (two) times daily.    Yes [provider]  lisinopril (PRINIVIL,ZESTRIL) 2.5 MG tablet Take 1 tablet (2.5 mg total) by mouth daily. 01/13/17  Yes Allred, Fayrene Fearing, MD  NITROSTAT 0.4 MG SL tablet Place 0.4 mg under the tongue as needed for chest pain.  01/17/11  Yes [provider]  XARELTO 20 MG TABS tablet TAKE 1 TABLET BY MOUTH  DAILY 08/25/16  Yes Allred, Fayrene Fearing, MD    Family History Family History  Problem Relation Age of Onset  . Colon cancer Neg Hx     Social History Social History   Tobacco Use  . Smoking status: Former Games developer  . Smokeless tobacco: Never Used  . Tobacco comment: quit 40 years ago  Substance Use Topics  . Alcohol use: No    Alcohol/week: 0.0 oz  . Drug use: No     Allergies   Patient has no known allergies.   Review of Systems Review of Systems  Unable to perform ROS: Dementia     Physical Exam Updated Vital Signs BP 108/86   Pulse 73   Temp 99.8 F (37.7 C) (Rectal)   Resp 11   Ht 6' (1.829 m)   Wt 87.1 kg (192 lb)   SpO2 99%   BMI 26.04 kg/m   Physical Exam  Constitutional: He appears well-developed and well-nourished. He appears lethargic.  HENT:  Head: Normocephalic and atraumatic.  Right Ear: External ear normal.  Left Ear: External ear normal.  Nose: Nose normal.  Mouth/Throat: Mucous membranes are dry.  Eyes: Pupils are equal, round, and reactive to light. Right eye exhibits no discharge. Left eye exhibits no discharge.  Neck: Neck supple.  Cardiovascular: Normal rate, regular rhythm and normal heart sounds.  Pulmonary/Chest: Effort normal. He has wheezes.  Audible oral wheezing at times when awake. Goes away when he falls asleep/closes eyes.Decreased BS on auscultation diffusely  Abdominal: Soft. There is no tenderness.  Musculoskeletal: He exhibits edema (BLE).  Neurological: He appears lethargic.  Lethargic, but opens eyes to voice. occasionally shakes head yes and no. Moves all  4 extremities weakly on command.  Skin: Skin is warm and dry.  Nursing note and vitals reviewed.    ED Treatments / Results  Labs (all labs ordered are listed, but only abnormal results are displayed) Labs Reviewed  COMPREHENSIVE METABOLIC PANEL - Abnormal; Notable for the following components:      Result Value   CO2 34 (*)    Glucose, Bld 100 (*)    BUN 39 (*)    Creatinine, Ser 1.60 (*)    Albumin 3.1 (*)    GFR calc non Af Amer 37 (*)    GFR calc Af Amer 43 (*)    All other components within normal limits  CBC WITH DIFFERENTIAL/PLATELET - Abnormal; Notable for the following components:   HCT 54.7 (*)  MCV 104.0 (*)    RDW 15.7 (*)    Platelets 130 (*)    All other components within normal limits  TROPONIN I - Abnormal; Notable for the following components:   Troponin I 0.13 (*)    All other components within normal limits  BRAIN NATRIURETIC PEPTIDE - Abnormal; Notable for the following components:   B Natriuretic Peptide 1,957.0 (*)    All other components within normal limits  BLOOD GAS, ARTERIAL - Abnormal; Notable for the following components:   pH, Arterial 7.328 (*)    pCO2 arterial 57.8 (*)    pO2, Arterial 128 (*)    Acid-Base Excess 3.9 (*)    All other components within normal limits  URINALYSIS, ROUTINE W REFLEX MICROSCOPIC  AMMONIA  RAPID URINE DRUG SCREEN, HOSP PERFORMED  BASIC METABOLIC PANEL  CBC WITH DIFFERENTIAL/PLATELET  TSH  VITAMIN B12  FOLATE RBC  RPR  HIV ANTIBODY (ROUTINE TESTING)  TROPONIN I  TROPONIN I  CBG MONITORING, ED    EKG  EKG Interpretation  Date/Time:  Friday April 10 2017 17:49:29 EST Ventricular Rate:  66 PR Interval:    QRS Duration: 117 QT Interval:  408 QTC Calculation: 591 R Axis:   117 Text Interpretation:  Afib/flut and V-paced complexes No further analysis attempted due to paced rhythm no significant change since 4 days ago Confirmed by Pricilla Loveless [phone]) on 04/10/2017 6:09:38 PM        Radiology Ct Head Wo Contrast  Result Date: 04/10/2017 CLINICAL DATA:  Altered level of consciousness. EXAM: CT HEAD WITHOUT CONTRAST TECHNIQUE: Contiguous axial images were obtained from the base of the skull through the vertex without intravenous contrast. COMPARISON:  Four days prior FINDINGS: Brain: No evidence of acute infarction, hemorrhage, hydrocephalus, extra-axial collection or mass lesion/mass effect. Remote left insular infarct. Generalized atrophy. Vascular: Atherosclerotic calcification. No gross hyperdense vessel. Skull: No acute or aggressive finding. Sinuses/Orbits: Bilateral cataract resection.  No acute finding. IMPRESSION: 1. Motion degraded study without acute finding. 2. Atrophy and remote left insular infarct. Electronically Signed   By: Marnee Spring M.D.   On: 04/10/2017 19:13   Dg Chest Portable 1 View  Result Date: 04/10/2017 CLINICAL DATA:  Altered mental status. Hypoxia today. Cardiomyopathy. EXAM: PORTABLE CHEST 1 VIEW COMPARISON:  04/08/2017. FINDINGS: Enlarged cardiac silhouette without significant change. Mild increase in prominence of the pulmonary vasculature and interstitial markings. Increased patchy opacity at both lung bases with probable bilateral pleural effusions. Stable left subclavian pacemaker lead. Thoracic spine degenerative changes. IMPRESSION: Worsening changes of congestive heart failure. Electronically Signed   By: Beckie Salts M.D.   On: 04/10/2017 18:58    Procedures Procedures (including critical care time)  Medications Ordered in ED Medications  furosemide (LASIX) injection 40 mg (not administered)  carvedilol (COREG) tablet 3.125 mg (not administered)  Vitamin D CAPS 2,000 Units (not administered)  ezetimibe (ZETIA) tablet 10 mg (not administered)  isosorbide mononitrate (IMDUR) 24 hr tablet 30 mg (not administered)  levETIRAcetam (KEPPRA) tablet 250 mg (not administered)  rivaroxaban (XARELTO) tablet 20 mg (not administered)   lisinopril (PRINIVIL,ZESTRIL) tablet 2.5 mg (not administered)  sodium chloride flush (NS) 0.9 % injection 3 mL (not administered)  sodium chloride flush (NS) 0.9 % injection 3 mL (not administered)  0.9 %  sodium chloride infusion (not administered)  acetaminophen (TYLENOL) tablet 650 mg (not administered)  ondansetron (ZOFRAN) injection 4 mg (not administered)  furosemide (LASIX) injection 40 mg (not administered)  albuterol (PROVENTIL) (2.5 MG/3ML) 0.083% nebulizer solution  2.5 mg (2.5 mg Nebulization Given 04/10/17 1829)     Initial Impression / Assessment and Plan / ED Course  I have reviewed the triage vital signs and the nursing notes.  Pertinent labs & imaging results that were available during my care of the patient were reviewed by me and considered in my medical decision making (see chart for details).     Patient's workup is significant for worsening CHF.  BNP is higher and chest x-ray is worse.  However this does not explain his lethargy and acute change in altered mental status.  CT does not show an acute bleed or other acute obvious cause in his brain.  Lab work does not show another acute cause.  He does have some mild respiratory acidosis but his pH is over 7.3 so I do not think this is the cause.  The patient is somnolent but easily arouses and is protecting airway.  At this point, plan to diurese and admit the patient for further workup.  Dr. Antionette Charpyd to admit.  Final Clinical Impressions(s) / ED Diagnoses   Final diagnoses:  Altered mental status, unspecified altered mental status type  Acute on chronic congestive heart failure, unspecified heart failure type [facility](HCC)  Acute respiratory failure with hypoxia [facility](HCC)    ED Discharge Orders    None       Pricilla LovelessGoldston, Alvis Pulcini, MD 04/10/17 2108

## 2017-04-10 NOTE — Progress Notes (Signed)
Pharmacy Antibiotic Note  Daniel Daniel [name] is Daniel [name] 83 y.o. male admitted on 04/10/2017 with aspiration pneumonia.  Pharmacy has been consulted for UNASYN dosing.  Plan: Unasyn 3gm IV q8h (1st dose in ED) Monitor labs, progress, c/s  Height: 6' (182.9 cm) Weight: 192 lb (87.1 kg) IBW/kg (Calculated) : 77.6  Temp (24hrs), Avg:99.8 F (37.7 C), Min:99.8 F (37.7 C), Max:99.8 F (37.7 C)  Recent Labs  Lab 04/06/17 1615 04/06/17 1629 04/06/17 1644 04/06/17 1826 04/07/17 0606 04/08/17 0558 04/09/17 0521 04/10/17 1825  WBC 9.4  --   --   --  9.5 8.5 8.6 8.0  CREATININE  --  1.60*  --   --  1.81* 1.67* 1.45* 1.60*  LATICACIDVEN  --   --  3.31* 2.03*  --   --   --   --     Estimated Creatinine Clearance: 35.7 mL/min (Daniel [name]) (by C-G formula based on SCr of 1.6 mg/dL (H)).    No Known Allergies  Antimicrobials this admission: Unasyn 11/30 >>   Dose adjustments this admission:  Microbiology results:  BCx: pending  UCx: pending   Sputum:    MRSA PCR:   Thank you for allowing pharmacy to be Daniel [name] part of this patient's care.  Daniel Daniel [name], Daniel Daniel [name] 04/10/2017 9:27 PM

## 2017-04-11 DIAGNOSIS — G9340 Encephalopathy, unspecified: Secondary | ICD-10-CM

## 2017-04-11 DIAGNOSIS — I5023 Acute on chronic systolic (congestive) heart failure: Secondary | ICD-10-CM

## 2017-04-11 LAB — TROPONIN I
TROPONIN I: 0.09 ng/mL — AB (ref <0.03)
Troponin I: 0.1 ng/mL
Troponin I: 0.11 ng/mL

## 2017-04-11 LAB — CBC WITH DIFFERENTIAL/PLATELET
BASOS ABS: 0 10*3/uL (ref 0.0–0.1)
Basophils Relative: 0 %
Eosinophils Absolute: 0.1 10*3/uL (ref 0.0–0.7)
Eosinophils Relative: 2 %
HEMATOCRIT: 54.5 % — AB (ref 39.0–52.0)
HEMOGLOBIN: 16.3 g/dL (ref 13.0–17.0)
LYMPHS ABS: 1.4 10*3/uL (ref 0.7–4.0)
LYMPHS PCT: 15 %
MCH: 31 pg (ref 26.0–34.0)
MCHC: 29.9 g/dL — ABNORMAL LOW (ref 30.0–36.0)
MCV: 103.8 fL — AB (ref 78.0–100.0)
Monocytes Absolute: 1.1 10*3/uL — ABNORMAL HIGH (ref 0.1–1.0)
Monocytes Relative: 12 %
NEUTROS ABS: 6.7 10*3/uL (ref 1.7–7.7)
NEUTROS PCT: 72 %
PLATELETS: 117 10*3/uL — AB (ref 150–400)
RBC: 5.25 MIL/uL (ref 4.22–5.81)
RDW: 15.5 % (ref 11.5–15.5)
WBC: 9.4 10*3/uL (ref 4.0–10.5)

## 2017-04-11 LAB — BASIC METABOLIC PANEL
ANION GAP: 7 (ref 5–15)
BUN: 38 mg/dL — AB (ref 6–20)
CHLORIDE: 105 mmol/L (ref 101–111)
CO2: 33 mmol/L — ABNORMAL HIGH (ref 22–32)
Calcium: 9.1 mg/dL (ref 8.9–10.3)
Creatinine, Ser: 1.3 mg/dL — ABNORMAL HIGH (ref 0.61–1.24)
GFR, EST AFRICAN AMERICAN: 55 mL/min — AB (ref >60)
GFR, EST NON AFRICAN AMERICAN: 48 mL/min — AB (ref >60)
Glucose, Bld: 80 mg/dL (ref 65–99)
POTASSIUM: 4.6 mmol/L (ref 3.5–5.1)
SODIUM: 145 mmol/L (ref 135–145)

## 2017-04-11 LAB — TSH: TSH: 4.721 u[IU]/mL — ABNORMAL HIGH (ref 0.350–4.500)

## 2017-04-11 LAB — GLUCOSE, CAPILLARY: Glucose-Capillary: 74 mg/dL (ref 65–99)

## 2017-04-11 LAB — VITAMIN B12: Vitamin B-12: 698 pg/mL (ref 180–914)

## 2017-04-11 NOTE — Progress Notes (Signed)
Late entry:  Patient's BP systolic <100.  Dr. Ardyth HarpsHernandez notified and gave order to hold blood pressure medications at this time.

## 2017-04-11 NOTE — Progress Notes (Signed)
PROGRESS NOTE    Daniel Brown  ZOX:[id number] DOB: 10/30/1927 DOA: 04/10/2017 PCP: [name], [name], MD (Inactive)     Brief Narrative:  83 year old man admitted to the hospital from sniff on 12/1 due to unresponsiveness.  He was just discharged from the hospital 2 days ago for treatment of aspiration pneumonia.  At the time of discharge he was awake although drowsy.   Assessment & Plan:   Principal Problem:   Acute encephalopathy Active Problems:   Acute on chronic systolic CHF (congestive heart failure) (HCC)   COPD (chronic obstructive pulmonary disease) (HCC)   Seizure disorder (HCC)   Paroxysmal atrial fibrillation (HCC)   Elevated troponin   CKD (chronic kidney disease), stage III (HCC)   Acute on chronic respiratory failure with hypercapnia (HCC)   Acute encephalopathy -Per family members at bedside he is more alert today, is able to open eyes and make eye contact with me, is also able to answer some questions.  Family states however that this is still not his baseline from a few weeks ago. -Etiology of encephalopathy is still undetermined.  Certainly his recently diagnosed aspiration pneumonia could be at fault, I see no other evidence of infection. -No focal neurologic deficits on top of his baseline, I did discuss with family possibility of obtaining an MRI to see if he has had another CVA event, however given his age and unlikely possibility of therapeutic intervention I feel like we should defer this, family agrees. -He also has somewhat of hypercarbia on his ABG the pH is 7.32/PCO2 of 57/bicarb of 26, this could be somewhat of a culprit as well. -Ammonia level is WNL. -Since he does have history of a seizure disorder we will go ahead and request EEG.  CKD Stage III -Cr improved from prior admission.  Acute on chronic systolic heart failure/acute on chronic respiratory failure with hypercarbia -Peripheral edema actually looks improved from prior admission, however  chest x-ray shows worsening CHF, continue Lasix 40 mg IV every 12 and aim for negative fluid balance. -Doubt that the CHF is contributing to his acute encephalopathy. -Echo done last hospitalization with EF of 40% and positive for wall motion abnormalities.  Discussed with family at bedside we have decided to defer cardiology consultation as he would likely not be a candidate for any further interventions.  Elevated troponin -No chest pain, likely related to acute CHF and renal insufficiency. -See above, no plans for cardiac intervention at this time.  Seizure disorder -No evidence for seizure on admission  Recent diagnosis of aspiration pneumonia -We will continue Unasyn -Continue suppression with Keppra.  History of A. fib -Rate controlled, anticoagulated on Xarelto   DVT prophylaxis: Xarelto Code Status: DNR Family Communication: Discussed with son and daughter at bedside. Disposition Plan: Back to SNF once stable, should probably discuss palliation over the next 2-3 days.  Consultants:   None  Procedures:   None  Antimicrobials:  Anti-infectives (From admission, onward)   Start     Dose/Rate Route Frequency Ordered Stop   04/11/17 0600  Ampicillin-Sulbactam (UNASYN) 3 g in sodium chloride 0.9 % 100 mL IVPB     3 g 200 mL/hr over 30 Minutes Intravenous Every 8 hours 04/10/17 2127     04/10/17 2130  Ampicillin-Sulbactam (UNASYN) 3 g in sodium chloride 0.9 % 100 mL IVPB     3 g 200 mL/hr over 30 Minutes Intravenous  Once 04/10/17 2121 04/10/17 2200       Subjective: Lying in bed, opens eyes  to voice, can answer simple questions  Objective: Vitals:   04/10/17 2130 04/10/17 2227 04/11/17 0520 04/11/17 1332  BP: 112/77 115/77 102/76 (!) 96/50  Pulse: (!) 59 69 66 67  Resp: 16 (!) 24 10 16   Temp:  98.3 F (36.8 C) 97.8 F (36.6 C) 97.9 F (36.6 C)  TempSrc:  Axillary Axillary Axillary  SpO2: 100% 100% 100% 100%  Weight:  88.9 kg (195 lb 14.4 oz)    Height:   6' (1.829 m)      Intake/Output Summary (Last 24 hours) at 04/11/2017 1637 Last data filed at 04/11/2017 1343 Gross per 24 hour  Intake 200 ml  Output 1850 ml  Net -1650 ml   Filed Weights   04/10/17 1749 04/10/17 2227  Weight: 87.1 kg (192 lb) 88.9 kg (195 lb 14.4 oz)    Examination:  General exam: Alert, awake, oriented x 3, drowsy Respiratory system: Bilateral crackles Cardiovascular system:RRR. No murmurs, rubs, gallops. Gastrointestinal system: Abdomen is nondistended, soft and nontender. No organomegaly or masses felt. Normal bowel sounds heard. Central nervous system: Alert and oriented. No focal neurological deficits. Extremities: 1-2+ pitting edema with positive pedal pulses Skin: No rashes, lesions or ulcers    Data Reviewed: I have personally reviewed following labs and imaging studies  CBC: Recent Labs  Lab 04/07/17 0606 04/08/17 0558 04/09/17 0521 04/10/17 1825 04/11/17 0647  WBC 9.5 8.5 8.6 8.0 9.4  NEUTROABS 7.4 6.6 6.8 6.3 6.7  HGB 16.4 16.6 16.2 16.7 16.3  HCT 53.3* 54.8* 52.6* 54.7* 54.5*  MCV 101.5* 102.4* 102.1* 104.0* 103.8*  PLT 168 156 147* 130* 117*   Basic Metabolic Panel: Recent Labs  Lab 04/06/17 1900 04/07/17 0606 04/08/17 0558 04/09/17 0521 04/10/17 1825 04/11/17 0647  NA  --  142 143 142 140 145  K  --  3.9 3.7 3.9 4.7 4.6  CL  --  104 105 102 101 105  CO2  --  29 30 31  34* 33*  GLUCOSE  --  99 100* 105* 100* 80  BUN  --  44* 40* 34* 39* 38*  CREATININE  --  1.81* 1.67* 1.45* 1.60* 1.30*  CALCIUM  --  9.3 9.0 9.0 9.1 9.1  MG 2.0  --   --  1.7  --   --   PHOS  --  3.8  --   --   --   --    GFR: Estimated Creatinine Clearance: 43.9 mL/min (A) (by C-G formula based on SCr of 1.3 mg/dL (H)). Liver Function Tests: Recent Labs  Lab 04/06/17 1615 04/07/17 0606 04/10/17 1825  AST 29 24 24   ALT 18 17 18   ALKPHOS 77 67 70  BILITOT 1.3* 1.4* 1.1  PROT 6.5 6.2* 6.5  ALBUMIN 3.2* 3.1* 3.1*   No results for input(s): LIPASE,  AMYLASE in the last 168 hours. Recent Labs  Lab 04/06/17 1615 04/10/17 1825  AMMONIA 21 24   Coagulation Profile: No results for input(s): INR, PROTIME in the last 168 hours. Cardiac Enzymes: Recent Labs  Lab 04/10/17 1825 04/11/17 0036 04/11/17 0647  TROPONINI 0.13* 0.10* 0.11*   BNP (last 3 results) No results for input(s): PROBNP in the last 8760 hours. HbA1C: No results for input(s): HGBA1C in the last 72 hours. CBG: Recent Labs  Lab 04/07/17 2013 04/10/17 1843  GLUCAP 106* 98   Lipid Profile: No results for input(s): CHOL, HDL, LDLCALC, TRIG, CHOLHDL, LDLDIRECT in the last 72 hours. Thyroid Function Tests: Recent Labs  04/11/17 0036  TSH 4.721*   Anemia Panel: Recent Labs    04/11/17 0648  VITAMINB12 698   Urine analysis:    Component Value Date/Time   COLORURINE YELLOW 04/10/2017 2000   APPEARANCEUR CLEAR 04/10/2017 2000   LABSPEC 1.018 04/10/2017 2000   PHURINE 5.0 04/10/2017 2000   GLUCOSEU NEGATIVE 04/10/2017 2000   HGBUR NEGATIVE 04/10/2017 2000   BILIRUBINUR NEGATIVE 04/10/2017 2000   KETONESUR NEGATIVE 04/10/2017 2000   PROTEINUR NEGATIVE 04/10/2017 2000   UROBILINOGEN 0.2 09/13/2012 1958   NITRITE NEGATIVE 04/10/2017 2000   LEUKOCYTESUR NEGATIVE 04/10/2017 2000   Sepsis Labs: @LABRCNTIP (procalcitonin:4,lacticidven:4)  ) Recent Results (from the past 240 hour(s))  Urine culture     Status: None   Collection Time: 04/06/17  4:30 PM  Result Value Ref Range Status   Specimen Description URINE, CATHETERIZED  Final   Special Requests NONE  Final   Culture   Final    NO GROWTH Performed at [facility], [address] [place], [place] [address]    Report Status 04/08/2017 FINAL  Final  MRSA PCR Screening     Status: None   Collection Time: 04/06/17 10:40 PM  Result Value Ref Range Status   MRSA by PCR NEGATIVE NEGATIVE Final    Comment:        The GeneXpert MRSA Assay (FDA approved for NASAL specimens only), is one  component of a comprehensive MRSA colonization surveillance program. It is not intended to diagnose MRSA infection nor to guide or monitor treatment for MRSA infections.          Radiology Studies: Ct Head Wo Contrast  Result Date: 04/10/2017 CLINICAL DATA:  Altered level of consciousness. EXAM: CT HEAD WITHOUT CONTRAST TECHNIQUE: Contiguous axial images were obtained from the base of the skull through the vertex without intravenous contrast. COMPARISON:  Four days prior FINDINGS: Brain: No evidence of acute infarction, hemorrhage, hydrocephalus, extra-axial collection or mass lesion/mass effect. Remote left insular infarct. Generalized atrophy. Vascular: Atherosclerotic calcification. No gross hyperdense vessel. Skull: No acute or aggressive finding. Sinuses/Orbits: Bilateral cataract resection.  No acute finding. IMPRESSION: 1. Motion degraded study without acute finding. 2. Atrophy and remote left insular infarct. Electronically Signed   By: Marnee SpringJonathon  Watts M.D.   On: 04/10/2017 19:13   Dg Chest Portable 1 View  Result Date: 04/10/2017 CLINICAL DATA:  Altered mental status. Hypoxia today. Cardiomyopathy. EXAM: PORTABLE CHEST 1 VIEW COMPARISON:  04/08/2017. FINDINGS: Enlarged cardiac silhouette without significant change. Mild increase in prominence of the pulmonary vasculature and interstitial markings. Increased patchy opacity at both lung bases with probable bilateral pleural effusions. Stable left subclavian pacemaker lead. Thoracic spine degenerative changes. IMPRESSION: Worsening changes of congestive heart failure. Electronically Signed   By: Beckie SaltsSteven  Reid M.D.   On: 04/10/2017 18:58        Scheduled Meds: . carvedilol  3.125 mg Oral BID WC  . cholecalciferol  2,000 Units Oral Daily  . ezetimibe  10 mg Oral Daily  . furosemide  40 mg Intravenous Q12H  . isosorbide mononitrate  30 mg Oral Daily  . levETIRAcetam  250 mg Oral BID  . lisinopril  2.5 mg Oral Daily  .  rivaroxaban  20 mg Oral Daily  . sodium chloride flush  3 mL Intravenous Q12H   Continuous Infusions: . sodium chloride    . ampicillin-sulbactam (UNASYN) IV Stopped (04/11/17 1219)     LOS: 1 day    Time spent: 25 minutes. Greater than 50% of this  time was spent in direct contact with the patient coordinating care.     Chaya Jan, MD [facility] Pager [phone]  If 7PM-7AM, please contact night-coverage [email or web address] Password [facility] 04/11/2017, 4:37 PM

## 2017-04-11 NOTE — Progress Notes (Signed)
Late entry:  Patient coughing while drinking thin liquids.  Dr. Ardyth HarpsHernandez notified.  Dr. Ardyth HarpsHernandez gave order to change to Dysphagia 1 diet with thin liquids.

## 2017-04-11 NOTE — Progress Notes (Signed)
Admitted pt from ED-alert to person only; follows simple commands; unable to obtain information on medical/surgical history-poor historian.

## 2017-04-12 LAB — CBC
HEMATOCRIT: 54.9 % — AB (ref 39.0–52.0)
Hemoglobin: 16.9 g/dL (ref 13.0–17.0)
MCH: 31.1 pg (ref 26.0–34.0)
MCHC: 30.8 g/dL (ref 30.0–36.0)
MCV: 101.1 fL — AB (ref 78.0–100.0)
Platelets: 128 10*3/uL — ABNORMAL LOW (ref 150–400)
RBC: 5.43 MIL/uL (ref 4.22–5.81)
RDW: 15.3 % (ref 11.5–15.5)
WBC: 8.3 10*3/uL (ref 4.0–10.5)

## 2017-04-12 LAB — BASIC METABOLIC PANEL
Anion gap: 9 (ref 5–15)
BUN: 34 mg/dL — AB (ref 6–20)
CHLORIDE: 102 mmol/L (ref 101–111)
CO2: 31 mmol/L (ref 22–32)
Calcium: 8.8 mg/dL — ABNORMAL LOW (ref 8.9–10.3)
Creatinine, Ser: 1.03 mg/dL (ref 0.61–1.24)
GFR calc Af Amer: >60 mL/min (ref >60)
GFR calc non Af Amer: >60 mL/min (ref >60)
GLUCOSE: 85 mg/dL (ref 65–99)
POTASSIUM: 3.5 mmol/L (ref 3.5–5.1)
Sodium: 142 mmol/L (ref 135–145)

## 2017-04-12 NOTE — Progress Notes (Signed)
Patient is incontinent. Staff has attempted to apply condom cath, but this has been unsuccessful. Unable to monitor strict I&O.

## 2017-04-12 NOTE — Progress Notes (Signed)
PROGRESS NOTE    Daniel Brown  ZOX:[id number] DOB: 09-04-1927 DOA: 04/10/2017 PCP: [name], MD (Inactive)     Brief Narrative:  83 year old man admitted to the hospital from sniff on 12/1 due to unresponsiveness.  He was just discharged from the hospital 2 days ago for treatment of aspiration pneumonia.  At the time of discharge he was awake although drowsy.   Assessment & Plan:   Principal Problem:   Acute encephalopathy Active Problems:   Acute on chronic systolic CHF (congestive heart failure) (HCC)   COPD (chronic obstructive pulmonary disease) (HCC)   Seizure disorder (HCC)   Paroxysmal atrial fibrillation (HCC)   Elevated troponin   CKD (chronic kidney disease), stage III (HCC)   Acute on chronic respiratory failure with hypercapnia (HCC)   Acute encephalopathy -Patient is much more alert today than he was yesterday.  There is no family at bedside. -Etiology of encephalopathy is still undetermined.  Certainly his recently diagnosed aspiration pneumonia could be at fault, I see no other evidence of infection. -No focal neurologic deficits on top of his baseline, I did discuss with family possibility of obtaining an MRI to see if he has had another CVA event, however given his age and unlikely possibility of therapeutic intervention I feel like we should defer this, family agrees. -He also has somewhat of hypercarbia on his ABG the pH is 7.32/PCO2 of 57/bicarb of 26, this could be somewhat of a culprit as well. -Ammonia level is WNL. -EEG pending since he does have a history of seizure disorder  CKD Stage III -Cr improved from prior admission.  Acute on chronic systolic heart failure/acute on chronic respiratory failure with hypercarbia -Peripheral edema actually looks improved from prior admission, however chest x-ray shows worsening CHF, continue Lasix 40 mg IV every 12 and aim for negative fluid balance. -Doubt that the CHF is contributing to his acute  encephalopathy. -Echo done last hospitalization with EF of 40% and positive for wall motion abnormalities.  Discussed with family at bedside we have decided to defer cardiology consultation as he would likely not be a candidate for any further interventions.  Elevated troponin -No chest pain, likely related to acute CHF and renal insufficiency. -See above, no plans for cardiac intervention at this time.  Seizure disorder -No evidence for seizure on admission  Recent diagnosis of aspiration pneumonia -We will continue Unasyn -Continue suppression with Keppra.  History of A. fib -Rate controlled, anticoagulated on Xarelto   DVT prophylaxis: Xarelto Code Status: DNR Family Communication: Discussed with son and daughter at bedside. Disposition Plan: Back to SNF once stable, palliative care consultation requested for tomorrow.  Consultants:   None  Procedures:   None  Antimicrobials:  Anti-infectives (From admission, onward)   Start     Dose/Rate Route Frequency Ordered Stop   04/11/17 0600  Ampicillin-Sulbactam (UNASYN) 3 g in sodium chloride 0.9 % 100 mL IVPB     3 g 200 mL/hr over 30 Minutes Intravenous Every 8 hours 04/10/17 2127     04/10/17 2130  Ampicillin-Sulbactam (UNASYN) 3 g in sodium chloride 0.9 % 100 mL IVPB     3 g 200 mL/hr over 30 Minutes Intravenous  Once 04/10/17 2121 04/10/17 2200       Subjective: In bed, much more alert and communicative, able to answer all my questions appropriately.  Objective: Vitals:   04/11/17 0520 04/11/17 1332 04/11/17 2015 04/12/17 0334  BP: 102/76 (!) 96/50 114/68 120/70  Pulse: 66 67 65 67  Resp: 10 16 20 18   Temp: 97.8 F (36.6 C) 97.9 F (36.6 C) 98.4 F (36.9 C) 97.9 F (36.6 C)  TempSrc: Axillary Axillary Oral Axillary  SpO2: 100% 100% 95% 100%  Weight:      Height:        Intake/Output Summary (Last 24 hours) at 04/12/2017 1111 Last data filed at 04/12/2017 [id number] Gross per 24 hour  Intake 670 ml  Output  1000 ml  Net -330 ml   Filed Weights   04/10/17 1749 04/10/17 2227  Weight: 87.1 kg (192 lb) 88.9 kg (195 lb 14.4 oz)    Examination:  General exam: Alert, awake, oriented x 3 Respiratory system: Mild bilateral crackles, normal respiratory effort Cardiovascular system:RRR. No murmurs, rubs, gallops. Gastrointestinal system: Abdomen is nondistended, soft and nontender. No organomegaly or masses felt. Normal bowel sounds heard. Central nervous system: Alert and oriented.  Extremities: No C/C/E, +pedal pulses Skin: No rashes, lesions or ulcers Psychiatry:Judgement and insight appear normal. Mood & affect appropriate.      Data Reviewed: I have personally reviewed following labs and imaging studies  CBC: Recent Labs  Lab 04/07/17 0606 04/08/17 0558 04/09/17 0521 04/10/17 1825 04/11/17 0647 04/12/17 0548  WBC 9.5 8.5 8.6 8.0 9.4 8.3  NEUTROABS 7.4 6.6 6.8 6.3 6.7  --   HGB 16.4 16.6 16.2 16.7 16.3 16.9  HCT 53.3* 54.8* 52.6* 54.7* 54.5* 54.9*  MCV 101.5* 102.4* 102.1* 104.0* 103.8* 101.1*  PLT 168 156 147* 130* 117* 128*   Basic Metabolic Panel: Recent Labs  Lab 04/06/17 1900  04/07/17 0606 04/08/17 0558 04/09/17 0521 04/10/17 1825 04/11/17 0647 04/12/17 0548  NA  --   --  142 143 142 140 145 142  K  --   --  3.9 3.7 3.9 4.7 4.6 3.5  CL  --   --  104 105 102 101 105 102  CO2  --    < > 29 30 31  34* 33* 31  GLUCOSE  --   --  99 100* 105* 100* 80 85  BUN  --   --  44* 40* 34* 39* 38* 34*  CREATININE  --   --  1.81* 1.67* 1.45* 1.60* 1.30* 1.03  CALCIUM  --    < > 9.3 9.0 9.0 9.1 9.1 8.8*  MG 2.0  --   --   --  1.7  --   --   --   PHOS  --   --  3.8  --   --   --   --   --    < > = values in this interval not displayed.   GFR: Estimated Creatinine Clearance: 55.5 mL/min (by C-G formula based on SCr of 1.03 mg/dL). Liver Function Tests: Recent Labs  Lab 04/06/17 1615 04/07/17 0606 04/10/17 1825  AST 29 24 24   ALT 18 17 18   ALKPHOS 77 67 70  BILITOT 1.3*  1.4* 1.1  PROT 6.5 6.2* 6.5  ALBUMIN 3.2* 3.1* 3.1*   No results for input(s): LIPASE, AMYLASE in the last 168 hours. Recent Labs  Lab 04/06/17 1615 04/10/17 1825  AMMONIA 21 24   Coagulation Profile: No results for input(s): INR, PROTIME in the last 168 hours. Cardiac Enzymes: Recent Labs  Lab 04/10/17 1825 04/11/17 0036 04/11/17 0647 04/11/17 2002  TROPONINI 0.13* 0.10* 0.11* 0.09*   BNP (last 3 results) No results for input(s): PROBNP in the last 8760 hours. HbA1C: No results for input(s): HGBA1C in the last 72 hours.  CBG: Recent Labs  Lab 04/07/17 2013 04/10/17 1843 04/11/17 2357  GLUCAP 106* 98 74   Lipid Profile: No results for input(s): CHOL, HDL, LDLCALC, TRIG, CHOLHDL, LDLDIRECT in the last 72 hours. Thyroid Function Tests: Recent Labs    04/11/17 0036  TSH 4.721*   Anemia Panel: Recent Labs    04/11/17 0648  VITAMINB12 698   Urine analysis:    Component Value Date/Time   COLORURINE YELLOW 04/10/2017 2000   APPEARANCEUR CLEAR 04/10/2017 2000   LABSPEC 1.018 04/10/2017 2000   PHURINE 5.0 04/10/2017 2000   GLUCOSEU NEGATIVE 04/10/2017 2000   HGBUR NEGATIVE 04/10/2017 2000   BILIRUBINUR NEGATIVE 04/10/2017 2000   KETONESUR NEGATIVE 04/10/2017 2000   PROTEINUR NEGATIVE 04/10/2017 2000   UROBILINOGEN 0.2 09/13/2012 1958   NITRITE NEGATIVE 04/10/2017 2000   LEUKOCYTESUR NEGATIVE 04/10/2017 2000   Sepsis Labs: @LABRCNTIP (procalcitonin:4,lacticidven:4)  ) Recent Results (from the past 240 hour(s))  Urine culture     Status: None   Collection Time: 04/06/17  4:30 PM  Result Value Ref Range Status   Specimen Description URINE, CATHETERIZED  Final   Special Requests NONE  Final   Culture   Final    NO GROWTH Performed at [facility], [address] [place], [place] [address]    Report Status 04/08/2017 FINAL  Final  MRSA PCR Screening     Status: None   Collection Time: 04/06/17 10:40 PM  Result Value Ref Range Status   MRSA by  PCR NEGATIVE NEGATIVE Final    Comment:        The GeneXpert MRSA Assay (FDA approved for NASAL specimens only), is one component of a comprehensive MRSA colonization surveillance program. It is not intended to diagnose MRSA infection nor to guide or monitor treatment for MRSA infections.          Radiology Studies: Ct Head Wo Contrast  Result Date: 04/10/2017 CLINICAL DATA:  Altered level of consciousness. EXAM: CT HEAD WITHOUT CONTRAST TECHNIQUE: Contiguous axial images were obtained from the base of the skull through the vertex without intravenous contrast. COMPARISON:  Four days prior FINDINGS: Brain: No evidence of acute infarction, hemorrhage, hydrocephalus, extra-axial collection or mass lesion/mass effect. Remote left insular infarct. Generalized atrophy. Vascular: Atherosclerotic calcification. No gross hyperdense vessel. Skull: No acute or aggressive finding. Sinuses/Orbits: Bilateral cataract resection.  No acute finding. IMPRESSION: 1. Motion degraded study without acute finding. 2. Atrophy and remote left insular infarct. Electronically Signed   By: Marnee Spring M.D.   On: 04/10/2017 19:13   Dg Chest Portable 1 View  Result Date: 04/10/2017 CLINICAL DATA:  Altered mental status. Hypoxia today. Cardiomyopathy. EXAM: PORTABLE CHEST 1 VIEW COMPARISON:  04/08/2017. FINDINGS: Enlarged cardiac silhouette without significant change. Mild increase in prominence of the pulmonary vasculature and interstitial markings. Increased patchy opacity at both lung bases with probable bilateral pleural effusions. Stable left subclavian pacemaker lead. Thoracic spine degenerative changes. IMPRESSION: Worsening changes of congestive heart failure. Electronically Signed   By: Beckie Salts M.D.   On: 04/10/2017 18:58        Scheduled Meds: . carvedilol  3.125 mg Oral BID WC  . cholecalciferol  2,000 Units Oral Daily  . ezetimibe  10 mg Oral Daily  . furosemide  40 mg Intravenous Q12H    . isosorbide mononitrate  30 mg Oral Daily  . levETIRAcetam  250 mg Oral BID  . lisinopril  2.5 mg Oral Daily  . rivaroxaban  20 mg Oral Daily  .  sodium chloride flush  3 mL Intravenous Q12H   Continuous Infusions: . sodium chloride    . ampicillin-sulbactam (UNASYN) IV Stopped (04/12/17 0508)     LOS: 2 days    Time spent: 25 minutes. Greater than 50% of this time was spent in direct contact with the patient coordinating care.     Chaya JanEstela Hernandez Acosta, MD [facility] Pager [phone]  If 7PM-7AM, please contact night-coverage [email or web address] Password [email or web address] 04/12/2017, 11:11 AM

## 2017-04-12 NOTE — Progress Notes (Signed)
Patient slightly irritable, somewhat cooperative with assessment and taking meds whole in applesauce. Patient agitated with incontinence care, but not combative. Condom cath unsuccessful d/t penile edema. Cathy, NT attempting to use emesis bag to catch urine. Patient resting in bed at this time, no anxiety noted. Will continue to monitor.

## 2017-04-13 DIAGNOSIS — J9601 Acute respiratory failure with hypoxia: Secondary | ICD-10-CM

## 2017-04-13 LAB — FOLATE RBC
Folate, Hemolysate: >620 ng/mL
Folate, RBC: >1188 ng/mL (ref >498)
Hematocrit: 52.2 % — ABNORMAL HIGH (ref 37.5–51.0)

## 2017-04-13 LAB — BASIC METABOLIC PANEL
ANION GAP: 9 (ref 5–15)
BUN: 34 mg/dL — ABNORMAL HIGH (ref 6–20)
CALCIUM: 8.9 mg/dL (ref 8.9–10.3)
CO2: 36 mmol/L — ABNORMAL HIGH (ref 22–32)
CREATININE: 1.14 mg/dL (ref 0.61–1.24)
Chloride: 97 mmol/L — ABNORMAL LOW (ref 101–111)
GFR, EST NON AFRICAN AMERICAN: 56 mL/min — AB (ref >60)
GLUCOSE: 95 mg/dL (ref 65–99)
Potassium: 3.4 mmol/L — ABNORMAL LOW (ref 3.5–5.1)
Sodium: 142 mmol/L (ref 135–145)

## 2017-04-13 LAB — RPR: RPR: NONREACTIVE

## 2017-04-13 LAB — HIV ANTIBODY (ROUTINE TESTING W REFLEX): HIV SCREEN 4TH GENERATION: NONREACTIVE

## 2017-04-13 MED ORDER — AMOXICILLIN-POT CLAVULANATE 500-125 MG PO TABS: 1.0000 | ORAL_TABLET | Freq: Three times a day (TID) | ORAL | 0 refills | Status: AC

## 2017-04-13 NOTE — Progress Notes (Signed)
[facility] [place]

## 2017-04-13 NOTE — Progress Notes (Signed)
Pharmacy Antibiotic Note  Daniel Brown is a 83 y.o. male admitted on 04/10/2017 with aspiration pneumonia.  Pharmacy has been consulted for UNASYN dosing.  Plan: Continue Unasyn 3gm IV q8h  Monitor labs, progress, c/s Deescalate to PO when improved / deemed appropriate  Height: 6' (182.9 cm) Weight: 191 lb 5.8 oz (86.8 kg) IBW/kg (Calculated) : 77.6  Temp (24hrs), Avg:98 F (36.7 C), Min:97.7 F (36.5 C), Max:98.2 F (36.8 C)  Recent Labs  Lab 04/06/17 1644 04/06/17 1826  04/08/17 0558 04/09/17 0521 04/10/17 1825 04/11/17 0647 04/12/17 0548 04/13/17 0447  WBC  --   --    < > 8.5 8.6 8.0 9.4 8.3  --   CREATININE  --   --    < > 1.67* 1.45* 1.60* 1.30* 1.03 1.14  LATICACIDVEN 3.31* 2.03*  --   --   --   --   --   --   --    < > = values in this interval not displayed.    Estimated Creatinine Clearance: 50.1 mL/min (by C-G formula based on SCr of 1.14 mg/dL).    No Known Allergies  Antimicrobials this admission: Unasyn 11/30 >>   Dose adjustments this admission:  Microbiology results:  NONE  Thank you for allowing pharmacy to be a part of this patient's care.  Valrie HartHall, Dawit Tankard A 04/13/2017 10:26 AM

## 2017-04-13 NOTE — Progress Notes (Signed)
Collecting urine for strict I&O still unsuccessful. Patient incontinent large amounts.

## 2017-04-13 NOTE — Discharge Summary (Signed)
Physician Discharge Summary  Daniel Gemmaugene P Guglielmo ZOX:[id number] DOB: 03/27/1928 DOA: 04/10/2017  PCP: [name], [name], MD (Inactive)  Admit date: 04/10/2017 Discharge date: 04/13/2017  Time spent: 45 minutes  Recommendations for Outpatient Follow-up:  -To be discharged back to SNF today in stable condition. -To complete 3 more days of Augmentin for aspiration pneumonia. -I would consider requesting palliative care to see patient once they arrive at SNF for further discussions with patient and family.  Discharge Diagnoses:  Principal Problem:   Acute encephalopathy Active Problems:   Acute on chronic systolic CHF (congestive heart failure) (HCC)   COPD (chronic obstructive pulmonary disease) (HCC)   Seizure disorder (HCC)   Paroxysmal atrial fibrillation (HCC)   Elevated troponin   CKD (chronic kidney disease), stage III (HCC)   Acute on chronic respiratory failure with hypercapnia [facility](HCC)   Discharge Condition: Stable and improved  Filed Weights   04/10/17 2227 04/12/17 1609 04/13/17 0427  Weight: 88.9 kg (195 lb 14.4 oz) 85.6 kg (188 lb 12.8 oz) 86.8 kg (191 lb 5.8 oz)    History of present illness:  As per Dr. Antionette Charpyd on 11/30: Daniel Brown is a 83 y.o. male with medical history significant for coronary artery disease, atrial fibrillation on Xarelto, chronic systolic CHF, complete heart block with pacer, seizure disorder, and chronic kidney disease stage III, now presenting to the emergency department for evaluation of decreased responsiveness.  Patient was just discharged from the hospital yesterday after an admission with altered mental status, during which she was found to have had an aspiration pneumonia, acute systolic CHF, and acute hypercapnic respiratory failure.  Patient was treated with Unasyn and discharged on Augmentin, diuresed, started on 2 L/min of supplemental oxygen, and discharged to an SNF in improved and stable condition.  Tonight, he was noted by SNF personnel  to be lethargic and was said to be responding only to painful stimuli.  He was transported to the hospital for evaluation of this.  Family at the bedside reports that he had nearly returned to baseline mental status by time of discharge yesterday, but he is now much more lethargic and less responsive than usual.  ED Course: Upon arrival to the ED, patient is found to have a temp of 37.7 degree C, requiring 4 L/min of supplemental oxygen in order to maintain saturations in the 90s, tachypnea, and with stable blood pressure.  EKG features atrial fibrillation with ventricular pacing.  Chest x-ray findings are consistent with worsening CHF.  Noncontrast head CT is negative for acute intracranial abnormality.  Chemistry panel reveals a BUN of 39 and creatinine of 1.60, improved from 1.8 thr days earlier, but up from 1.2 last month.  CBC is notable for a mild thrombocytopenia with platelets 130,000.  BNP is elevated at 1957 troponin is elevated to 0.13.  UDS is negative and urinalysis is unremarkable.  Patient was treated with albuterol neb and 40 mg IV Lasix in the ED.  He remained hemodynamically stable, but disoriented and  slightly dyspneic.  He will be admitted to the telemetry unit for ongoing evaluation and management of acute encephalopathy and acute on chronic systolic CHF with acute on chronic hypercapnic respiratory failure.    Hospital Course:   Acute encephalopathy -Patient is much more alert today than he was yesterday.  There is no family at bedside. -Etiology of encephalopathy is still undetermined.  Certainly his recently diagnosed aspiration pneumonia could be at fault, I see no other evidence of infection. -No focal neurologic  deficits on top of his baseline, I did discuss with family possibility of obtaining an MRI to see if he has had another CVA event, however given his age and unlikely possibility of therapeutic intervention I feel like we should defer this, family agrees. -He also has  somewhat of hypercarbia on his ABG the pH is 7.32/PCO2 of 57/bicarb of 26, this could be somewhat of a culprit as well. -Ammonia level is WNL. -EEG pending since he does have a history of seizure disorder  CKD Stage III -Cr improved from prior admission. -Cr is 1.14 on DC.  Acute on chronic systolic heart failure/acute on chronic respiratory failure with hypercarbia -Peripheral edema actually looks improved from prior admission, however chest x-ray on admission showed worsening CHF. -Documented intake and output records only 475- cc, however doubt this is correct given significant improvement in upper and lower extremity edema as well as decreased crackles on lung exam. -Doubt that the CHF is contributing to his acute encephalopathy. -Echo done last hospitalization with EF of 40% and positive for wall motion abnormalities.  Discussed with family at bedside we have decided to defer cardiology consultation as he would likely not be a candidate for any further interventions.  Elevated troponin -No chest pain, likely related to acute CHF and renal insufficiency. -See above, no plans for cardiac intervention at this time.  Seizure disorder -No evidence for seizure on admission -Continue suppression with Keppra.  Recent diagnosis of aspiration pneumonia -Has an extra 3 days of Augmentin remaining.   History of A. fib -Rate controlled, anticoagulated on Xarelto      Procedures:  None  Consultations:  None  Discharge Instructions  Discharge Instructions    Diet - low sodium heart healthy   Complete by:  As directed    Increase activity slowly   Complete by:  As directed      Allergies as of 04/13/2017   No Known Allergies     Medication List    TAKE these medications   amoxicillin-clavulanate 500-125 MG tablet Commonly known as:  AUGMENTIN Take 1 tablet (500 mg total) by mouth 3 (three) times daily for 3 days.   carvedilol 3.125 MG tablet Commonly known  as:  COREG Take 3.125 mg by mouth 2 (two) times daily.   ezetimibe 10 MG tablet Commonly known as:  ZETIA Take 10 mg by mouth daily.   furosemide 40 MG tablet Commonly known as:  LASIX Take 1 tablet (40 mg total) by mouth daily.   isosorbide mononitrate 30 MG 24 hr tablet Commonly known as:  IMDUR Take 30 mg by mouth daily.   levETIRAcetam 250 MG tablet Commonly known as:  KEPPRA Take 250 mg by mouth 2 (two) times daily.   lisinopril 2.5 MG tablet Commonly known as:  PRINIVIL,ZESTRIL Take 1 tablet (2.5 mg total) by mouth daily.   NITROSTAT 0.4 MG SL tablet Generic drug:  nitroGLYCERIN Place 0.4 mg under the tongue as needed for chest pain.   Vitamin D 2000 units Caps Take 2,000 Units by mouth daily.   XARELTO 20 MG Tabs tablet Generic drug:  rivaroxaban TAKE 1 TABLET BY MOUTH  DAILY      No Known Allergies Follow-up Information    [name], MD. Schedule an appointment as soon as possible for a visit in 2 week(s).   Specialty:  Family Medicine Contact information: [address] [place], [place] [address] [place] [address] [phone]            The results  of significant diagnostics from this hospitalization (including imaging, microbiology, ancillary and laboratory) are listed below for reference.    Significant Diagnostic Studies: Ct Head Wo Contrast  Result Date: 04/10/2017 CLINICAL DATA:  Altered level of consciousness. EXAM: CT HEAD WITHOUT CONTRAST TECHNIQUE: Contiguous axial images were obtained from the base of the skull through the vertex without intravenous contrast. COMPARISON:  Four days prior FINDINGS: Brain: No evidence of acute infarction, hemorrhage, hydrocephalus, extra-axial collection or mass lesion/mass effect. Remote left insular infarct. Generalized atrophy. Vascular: Atherosclerotic calcification. No gross hyperdense vessel. Skull: No acute or aggressive finding. Sinuses/Orbits: Bilateral cataract resection.  No acute finding.  IMPRESSION: 1. Motion degraded study without acute finding. 2. Atrophy and remote left insular infarct. Electronically Signed   By: Marnee Spring M.D.   On: 04/10/2017 19:13   Ct Head Wo Contrast  Result Date: 04/06/2017 CLINICAL DATA:  Chief eighth mental status changes. EXAM: CT HEAD WITHOUT CONTRAST TECHNIQUE: Contiguous axial images were obtained from the base of the skull through the vertex without intravenous contrast. COMPARISON:  02/23/2017 FINDINGS: Brain: There is no evidence for acute hemorrhage, hydrocephalus, mass lesion, or abnormal extra-axial fluid collection. No definite CT evidence for acute infarction. Diffuse loss of parenchymal volume is consistent with atrophy. Patchy low attenuation in the deep hemispheric and periventricular white matter is nonspecific, but likely reflects chronic microvascular ischemic demyelination. Old left MCA territory infarct again noted. Vascular: No hyperdense vessel or unexpected calcification. Skull: No evidence for fracture. No worrisome lytic or sclerotic lesion. Sinuses/Orbits: The visualized paranasal sinuses and mastoid air cells are clear. Visualized portions of the globes and intraorbital fat are unremarkable. Other: None. IMPRESSION: 1. Stable exam.  No acute intracranial abnormality. 2. Atrophy with chronic small vessel white matter ischemic disease. 3. Old left MCA territory infarct. Electronically Signed   By: Kennith Center M.D.   On: 04/06/2017 20:10   Dg Chest Portable 1 View  Result Date: 04/10/2017 CLINICAL DATA:  Altered mental status. Hypoxia today. Cardiomyopathy. EXAM: PORTABLE CHEST 1 VIEW COMPARISON:  04/08/2017. FINDINGS: Enlarged cardiac silhouette without significant change. Mild increase in prominence of the pulmonary vasculature and interstitial markings. Increased patchy opacity at both lung bases with probable bilateral pleural effusions. Stable left subclavian pacemaker lead. Thoracic spine degenerative changes. IMPRESSION:  Worsening changes of congestive heart failure. Electronically Signed   By: Beckie Salts M.D.   On: 04/10/2017 18:58   Dg Chest Port 1 View  Result Date: 04/08/2017 CLINICAL DATA:  83 year old male with altered mental status, vomiting, respiratory failure, suspected aspiration. EXAM: PORTABLE CHEST 1 VIEW COMPARISON:  04/06/2017 and earlier. FINDINGS: Portable AP upright view at 0517 hours. Moderate to severe cardiomegaly. Stable left chest cardiac pacemaker. Worsening bibasilar ventilation probably with a combination of lower lung volumes plus increasing veiling and confluent lung base opacity. No pneumothorax. Stable pulmonary vascularity. IMPRESSION: Worsening bilateral lower lung ventilation since yesterday felt to represent a combination of lower lung volumes plus increasing pleural effusions and lower lobe collapse or consolidation. Electronically Signed   By: Odessa Fleming M.D.   On: 04/08/2017 07:17   Dg Chest Port 1 View  Result Date: 04/06/2017 CLINICAL DATA:  Altered mental status and wheezing. EXAM: PORTABLE CHEST 1 VIEW COMPARISON:  02/23/2017 FINDINGS: The cardio pericardial silhouette is enlarged. Vascular congestion with pulmonary edema noted. There is bibasilar atelectasis or pneumonia with small bilateral pleural effusions. Left permanent pacemaker again noted. Bones are diffusely demineralized. Telemetry leads overlie the chest. IMPRESSION: Cardiomegaly with pulmonary edema and basilar  opacity likely atelectasis although pneumonia not excluded. Small bilateral pleural effusions. Electronically Signed   By: Kennith Center M.D.   On: 04/06/2017 17:04   Dg Swallowing Func-speech Pathology  Result Date: 04/08/2017 Objective Swallowing Evaluation: Type of Study: MBS-Modified Barium Swallow Study  Patient Details Name: ASAIAH SCARBER MRN: [id number] Date of Birth: 1927/06/01 Today's Date: 04/08/2017 Time: SLP Start Time (ACUTE ONLY): 1300 -SLP Stop Time (ACUTE ONLY): 1338 SLP Time Calculation (min)  (ACUTE ONLY): 38 min Past Medical History: Past Medical History: Diagnosis Date . CAD (coronary artery disease)   by cath 1/11, medical management advised . Cardiomyopathy, ischemic   EF 40% by echo 1/11 . Complete heart block (HCC)   s/p SJM PPM by JSA 01/10/11 . COPD (chronic obstructive pulmonary disease) (HCC)  . CVD (cardiovascular disease)   s/p MCA stroke 1/11 . HTN (hypertension)  . Hyperlipidemia  . Permanent atrial fibrillation (HCC)  . Seizure disorder (HCC)  . Stroke [facility])  Past Surgical History: Past Surgical History: Procedure Laterality Date . CHOLECYSTECTOMY   . ERCP N/A 02/26/2017  Procedure: ENDOSCOPIC RETROGRADE CHOLANGIOPANCREATOGRAPHY (ERCP);  Surgeon: Vida Rigger, MD;  Location: [facility] ENDOSCOPY;  Service: Endoscopy;  Laterality: N/A; . PACEMAKER INSERTION  01/10/11  by Fawn Kirk for [facility] HPI: EDOARDO LAFORTE is a 83 y.o. male with medical history significant of CAD on medical management only, ischemic cardiomyopathy, atrial fibrillation, complete heart block, pacemaker placement, history of MCA stroke, hypertension, hyperlipidemia, COPD, seizure disorder who was transferred from the nursing home due to altered mental status.  A chest radiograph was performed at the facility which apparently showed "CHF and right-sided pleural effusion"per facility's transfer documents.  The patient was found having emesis contents on his clothes.  He was admitted for aspiration pneumonia and CHF. BSE requested as Pt found to have suspected aspiration PNA.  Subjective: "I am ok." Assessment / Plan / Recommendation CHL IP CLINICAL IMPRESSIONS 04/08/2017 Clinical Impression Pt assessed in the lateral position with barium tinged thin, nectar, puree, mech soft, and regular textures. Pt presents with moderate oropharyngeal phase dysphagia characterized by weak lingual manipulation, mastication, and bolus propulsion resulting in delayed oral transit, piecemeal deglutition, oral residue, and premature spillage; pharyngeal phase is  marked by delay in swallow initiation with swallow trigger after filling the pyriforms, reduced tongue base retraction, and reduced laryngeal vestibule closure resulting in inconsistent penetration with occasion to the cords. As study progressed, Pt appeared to become more fatigued and no swallow trigger elicited with solids (pooled in pyriforms and valleculae) and also more confused. No gross aspiration observed, however Pt is at high risk for aspiration due to decreased cognition, reduced respiratory support, and delays in swallow trigger. Pharyngeal strength only mildly impaired once the swallow is triggered as evidenced by little residue post swallow. Pt did not follow verbal and visual cues for small sips, repeat/dry swallow, or other compensatory strategies. He needs tactile cue of hand to cup to encourage small sips/slow rate of presentation. Recommend D1/puree with thin liquids with 1:1 feeder assist toe ensure proper positioning and rate control. Prognosis for advancing diet is good once clinically improved (ie. shortness of breath). Present medications whole in puree. Above to RN, Arline Asp. SLP to follow during acute stay and recommend f/u SLP services at SNF.  SLP Visit Diagnosis Dysphagia, oropharyngeal phase (R13.12) Attention and concentration deficit following -- Frontal lobe and executive function deficit following -- Impact on safety and function Moderate aspiration risk   CHL IP TREATMENT RECOMMENDATION 04/08/2017 Treatment Recommendations Therapy  as outlined in treatment plan below   Prognosis 04/08/2017 Prognosis for Safe Diet Advancement Fair Barriers to Reach Goals Severity of deficits Barriers/Prognosis Comment -- CHL IP DIET RECOMMENDATION 04/08/2017 SLP Diet Recommendations Dysphagia 1 (Puree) solids;Thin liquid Liquid Administration via Cup;Straw Medication Administration Whole meds with puree Compensations Slow rate;Small sips/bites;Multiple dry swallows after each bite/sip;Follow solids with  liquid Postural Changes Remain semi-upright after after feeds/meals (Comment);Seated upright at 90 degrees   CHL IP OTHER RECOMMENDATIONS 04/08/2017 Recommended Consults -- Oral Care Recommendations Oral care BID;Staff/trained caregiver to provide oral care Other Recommendations Clarify dietary restrictions   CHL IP FOLLOW UP RECOMMENDATIONS 04/08/2017 Follow up Recommendations [facility] D[facility] 04/08/2017 Speech Therapy Frequency (ACUTE ONLY) min 2x/week Treatment Duration 1 week      CHL IP ORAL PHASE 04/08/2017 Oral Phase Impaired Oral - Pudding Teaspoon -- Oral - Pudding Cup -- Oral - Honey Teaspoon -- Oral - Honey Cup -- Oral - Nectar Teaspoon -- Oral - Nectar Cup -- Oral - Nectar Straw -- Oral - Thin Teaspoon -- Oral - Thin Cup -- Oral - Thin Straw -- Oral - Puree -- Oral - Mech Soft Impaired mastication;Weak lingual manipulation;Incomplete tongue to palate contact;Reduced posterior propulsion;Piecemeal swallowing;Delayed oral transit;Decreased bolus cohesion Oral - Regular Impaired mastication;Weak lingual manipulation;Incomplete tongue to palate contact;Reduced posterior propulsion;Piecemeal swallowing;Delayed oral transit;Decreased bolus cohesion Oral - Multi-Consistency -- Oral - Pill NT Oral Phase - Comment --  CHL IP PHARYNGEAL PHASE 04/08/2017 Pharyngeal Phase Impaired Pharyngeal- Pudding Teaspoon -- Pharyngeal -- Pharyngeal- Pudding Cup -- Pharyngeal -- Pharyngeal- Honey Teaspoon -- Pharyngeal -- Pharyngeal- Honey Cup -- Pharyngeal -- Pharyngeal- Nectar Teaspoon -- Pharyngeal -- Pharyngeal- Nectar Cup -- Pharyngeal -- Pharyngeal- Nectar Straw Delayed swallow initiation-pyriform sinuses;Reduced airway/laryngeal closure;Penetration/Aspiration during swallow;Penetration/Apiration after swallow;Pharyngeal residue - valleculae;Pharyngeal residue - pyriform;Lateral channel residue Pharyngeal Material does not enter airway;Material enters airway, remains ABOVE vocal  cords then ejected out;Material enters airway, CONTACTS cords and then ejected out;Material enters airway, remains ABOVE vocal cords and not ejected out Pharyngeal- Thin Teaspoon Delayed swallow initiation-pyriform sinuses Pharyngeal -- Pharyngeal- Thin Cup Delayed swallow initiation-pyriform sinuses;Reduced airway/laryngeal closure;Penetration/Aspiration during swallow;Penetration/Apiration after swallow;Pharyngeal residue - valleculae;Pharyngeal residue - pyriform;Lateral channel residue Pharyngeal Material does not enter airway;Material enters airway, remains ABOVE vocal cords then ejected out;Material enters airway, remains ABOVE vocal cords and not ejected out Pharyngeal- Thin Straw Delayed swallow initiation-pyriform sinuses;Reduced airway/laryngeal closure;Penetration/Aspiration during swallow;Penetration/Apiration after swallow;Trace aspiration;Pharyngeal residue - pyriform;Lateral channel residue Pharyngeal Material does not enter airway;Material enters airway, remains ABOVE vocal cords then ejected out;Material enters airway, remains ABOVE vocal cords and not ejected out;Material enters airway, passes BELOW cords then ejected out Pharyngeal- Puree Delayed swallow initiation-vallecula;Pharyngeal residue - valleculae;Reduced tongue base retraction;Pharyngeal residue - cp segment Pharyngeal -- Pharyngeal- Mechanical Soft Delayed swallow initiation-pyriform sinuses;Penetration/Apiration after swallow;Pharyngeal residue - valleculae;Pharyngeal residue - pyriform Pharyngeal Material enters airway, CONTACTS cords and not ejected out Pharyngeal- Regular Delayed swallow initiation-pyriform sinuses;Pharyngeal residue - valleculae;Pharyngeal residue - pyriform Pharyngeal -- Pharyngeal- Multi-consistency -- Pharyngeal -- Pharyngeal- Pill -- Pharyngeal -- Pharyngeal Comment --  CHL IP CERVICAL ESOPHAGEAL PHASE 04/08/2017 Cervical Esophageal Phase WFL Pudding Teaspoon -- Pudding Cup -- Honey Teaspoon -- Honey Cup --  Nectar Teaspoon -- Nectar Cup -- Nectar Straw -- Thin Teaspoon -- Thin Cup -- Thin Straw -- Puree -- Mechanical Soft -- Regular -- Multi-consistency -- Pill -- Cervical Esophageal Comment -- Thank you, Havery MorosDabney Porter, CCC-SLP [phone] No flowsheet data found. PORTER,DABNEY 04/08/2017, 3:04 PM  Microbiology: Recent Results (from the past 240 hour(s))  Urine culture     Status: None   Collection Time: 04/06/17  4:30 PM  Result Value Ref Range Status   Specimen Description URINE, CATHETERIZED  Final   Special Requests NONE  Final   Culture   Final    NO GROWTH Performed at [facility], [address], [place], [place] [address]    Report Status 04/08/2017 FINAL  Final  MRSA PCR Screening     Status: None   Collection Time: 04/06/17 10:40 PM  Result Value Ref Range Status   MRSA by PCR NEGATIVE NEGATIVE Final    Comment:        The GeneXpert MRSA Assay (FDA approved for NASAL specimens only), is one component of a comprehensive MRSA colonization surveillance program. It is not intended to diagnose MRSA infection nor to guide or monitor treatment for MRSA infections.      Labs: Basic Metabolic Panel: Recent Labs  Lab 04/06/17 1900 04/07/17 0606  04/09/17 0521 04/10/17 1825 04/11/17 0647 04/12/17 0548 04/13/17 0447  NA  --  142   < > 142 140 145 142 142  K  --  3.9   < > 3.9 4.7 4.6 3.5 3.4*  CL  --  104   < > 102 101 105 102 97*  CO2  --  29   < > 31 34* 33* 31 36*  GLUCOSE  --  99   < > 105* 100* 80 85 95  BUN  --  44*   < > 34* 39* 38* 34* 34*  CREATININE  --  1.81*   < > 1.45* 1.60* 1.30* 1.03 1.14  CALCIUM  --  9.3   < > 9.0 9.1 9.1 8.8* 8.9  MG 2.0  --   --  1.7  --   --   --   --   PHOS  --  3.8  --   --   --   --   --   --    < > = values in this interval not displayed.   Liver Function Tests: Recent Labs  Lab 04/06/17 1615 04/07/17 0606 04/10/17 1825  AST 29 24 24   ALT 18 17 18   ALKPHOS 77 67 70  BILITOT 1.3* 1.4* 1.1  PROT  6.5 6.2* 6.5  ALBUMIN 3.2* 3.1* 3.1*   No results for input(s): LIPASE, AMYLASE in the last 168 hours. Recent Labs  Lab 04/06/17 1615 04/10/17 1825  AMMONIA 21 24   CBC: Recent Labs  Lab 04/07/17 0606 04/08/17 0558 04/09/17 0521 04/10/17 1825 04/11/17 0647 04/12/17 0548  WBC 9.5 8.5 8.6 8.0 9.4 8.3  NEUTROABS 7.4 6.6 6.8 6.3 6.7  --   HGB 16.4 16.6 16.2 16.7 16.3 16.9  HCT 53.3* 54.8* 52.6* 54.7* 54.5* 54.9*  MCV 101.5* 102.4* 102.1* 104.0* 103.8* 101.1*  PLT 168 156 147* 130* 117* 128*   Cardiac Enzymes: Recent Labs  Lab 04/10/17 1825 04/11/17 0036 04/11/17 0647 04/11/17 2002  TROPONINI 0.13* 0.10* 0.11* 0.09*   BNP: BNP (last 3 results) Recent Labs    04/06/17 1630 04/10/17 1825  BNP 1,237.0* 1,957.0*    ProBNP (last 3 results) No results for input(s): PROBNP in the last 8760 hours.  CBG: Recent Labs  Lab 04/07/17 2013 04/10/17 1843 04/11/17 2357  GLUCAP 106* 98 74       Signed:  Chaya Jan  [facility] Pager: [phone] 04/13/2017, 3:08 PM

## 2017-04-13 NOTE — Clinical Social Work Note (Addendum)
Pt known to CSW from admission last week. Pt [facility] 11/[facility] and then returned to the ED on 11/30. Pt is stable for dc today. Plan is for pt to return to [facility]. Updated pt's family member, Starling Mannsmy McClure, who expressed she is in agreement. Pt requires O2 and will need [facility] transport which will be arranged once MD completes dc orders and summary. CSW updated Tammy from [facility] who states that they do not need a new FL2 at this time. She asks that DC summary be sent through the HUB.   [facility]. CSW will complete.   Clinical Social Work Assessment  Patient Details  Name: Daniel Brown MRN: [id number] Date of Birth: 06/21/1927  Date of referral:  04/07/17               Reason for consult:  Discharge Planning                           Permission sought to share information with:    Permission granted to share information::                Name::                   Agency::                Relationship::                Contact Information:     Housing/Transportation Living arrangements for the past 2 months:  Skilled Nursing Facility Source of Information:  Facility Patient Interpreter Needed:  None Criminal Activity/Legal Involvement Pertinent to Current Situation/Hospitalization:  No - Comment as needed Significant Relationships:  Spouse Lives with:  Facility Resident Do you feel safe going back to the place where you live?  Yes Need for family participation in patient care:  Yes (Comment)  Care giving concerns: [facility] and plan [facility] for [facility].   Social Worker assessment / plan: Pt is an 83 year old male admitted with altered mental status. [facility] and he plan is for transfer to LTC when rehab complete. Pt is only oriented to self this AM. [facility]. Per Tammy, pt needs only limited  assistance with transfers and ambulation. He has been able to feed and bathe himself. Pt's [facility] and has been for several years. [facility]. Plan is for return to [facility] at dc. Pt will need a new FL2. SW will follow and assist with dc planning.  Employment status:  Retired Database administratornsurance information:  Managed Medicare PT Recommendations:  Skilled Nursing Facility Information / Referral to community resources:     Patient/Family's Response to care: Pt appears accepting of care.  Patient/Family's Understanding of and Emotional Response to Diagnosis, Current Treatment, and Prognosis: Unable to assess due to pt's confusion.  Emotional Assessment Appearance:  Appears stated age Attitude/Demeanor/Rapport:    Affect (typically observed):  Calm Orientation:  Oriented to Self Alcohol / Substance use:  Not Applicable Psych involvement (Current and /or in the community):  No (Comment)  Discharge Needs  Concerns to be addressed:  Care Coordination Readmission within the last 30 days:  No Current discharge risk:  None Barriers to Discharge:  No Barriers Identified   Elliot GaultKathleen Demontrae Gilbert, LCSW 04/07/2017, 10:37 AM

## 2017-04-13 NOTE — Care Management Important Message (Signed)
Important Message  Patient Details  Name: Mady Gemmaugene P Memmer MRN: [id number] Date of Birth: 12/16/1927   Medicare Important Message Given:  Yes    Dionne Rossa, Chrystine OilerSharley Diane, RN 04/13/2017, 12:10 PM

## 2017-04-13 NOTE — NC FL2 (Signed)
[place] MEDICAID FL2 LEVEL OF CARE SCREENING TOOL     IDENTIFICATION  Patient Name: Daniel Brown Birthdate: 02/19/1928 Sex: male Admission Date (Current Location): 04/10/2017  [facility] [place]   Facility and Address:  [facility],  [address], [place] [id number]      Provider Number: [id number]  Attending Physician Name and Address:  Philip AspenHernandez Acosta, Minerva EndsEstela*  Relative Name and Phone Number:       Current Level of Care: Hospital Recommended Level of Care: Skilled Nursing Facility Prior Approval Number:    Date Approved/Denied:   PASRR Number: [id number]  Discharge Plan: SNF    Current Diagnoses: Patient Active Problem List   Diagnosis Date Noted  . Acute encephalopathy 04/10/2017  . Acute on chronic respiratory failure with hypercapnia (HCC) 04/06/2017  . Aspiration pneumonia due to vomit (HCC) 04/06/2017  . Pressure injury of skin 02/24/2017  . Non-traumatic rhabdomyolysis   . CKD (chronic kidney disease), stage III (HCC)   . Choledocholithiasis 02/23/2017  . ARF (acute renal failure) (HCC) 02/23/2017  . UTI (urinary tract infection) 02/23/2017  . Elevated troponin 02/23/2017  . Paroxysmal atrial fibrillation (HCC) 10/17/2015  . Therapeutic drug monitoring 10/17/2015  . Pacemaker-St.Jude 02/24/2012  . Complete heart block (HCC) 01/20/2011  . Chronic systolic dysfunction of left ventricle 09/23/2010  . Long term current use of anticoagulant 07/30/2010  . Coronary atherosclerosis 07/04/2009  . Hyperlipidemia 07/03/2009  . Essential hypertension 07/03/2009  . Acute on chronic systolic CHF (congestive heart failure) (HCC) 07/03/2009  . COPD (chronic obstructive pulmonary disease) (HCC) 07/03/2009  . Seizure disorder (HCC) 07/03/2009    Orientation RESPIRATION BLADDER Height & Weight     Self  O2 Incontinent Weight: 191 lb 5.8 oz (86.8 kg) Height:  6' (182.9 cm)  BEHAVIORAL SYMPTOMS/MOOD NEUROLOGICAL BOWEL  NUTRITION STATUS      Continent Diet(low sodium heart healthy)  AMBULATORY STATUS COMMUNICATION OF NEEDS Skin   Limited Assist Verbally PU Stage and Appropriate Care(L foot)                       Personal Care Assistance Level of Assistance  Bathing, Feeding, Dressing Bathing Assistance: Limited assistance Feeding assistance: Independent Dressing Assistance: Limited assistance     Functional Limitations Info  Sight, Hearing, Speech Sight Info: Adequate Hearing Info: Adequate Speech Info: Adequate    SPECIAL CARE FACTORS FREQUENCY  PT (By licensed PT)     PT Frequency: 5 times week              Contractures Contractures Info: Not present    Additional Factors Info  Code Status Code Status Info: DNR             Current Medications (04/13/2017):  This is the current hospital active medication list Current Facility-Administered Medications  Medication Dose Route Frequency Provider Last Rate Last Dose  . 0.9 %  sodium chloride infusion  250 mL Intravenous PRN Opyd, Lavone Neriimothy S, MD      . acetaminophen (TYLENOL) tablet 650 mg  650 mg Oral Q4H PRN Opyd, Lavone Neriimothy S, MD   650 mg at 04/12/17 1348  . Ampicillin-Sulbactam (UNASYN) 3 g in sodium chloride 0.9 % 100 mL IVPB  3 g Intravenous Q8H Opyd, Lavone Neriimothy S, MD   Stopped at 04/13/17 [phone] [id number]  . carvedilol (COREG) tablet 3.125 mg  3.125 mg Oral BID WC Opyd, Lavone Neriimothy S, MD   3.125 mg at 04/13/17 0840  . cholecalciferol (VITAMIN D) tablet  2,000 Units  2,000 Units Oral Daily Opyd, Lavone Neriimothy S, MD   2,000 Units at 04/13/17 1046  . ezetimibe (ZETIA) tablet 10 mg  10 mg Oral Daily Opyd, Lavone Neriimothy S, MD   10 mg at 04/13/17 1046  . furosemide (LASIX) injection 40 mg  40 mg Intravenous Q12H Opyd, Lavone Neriimothy S, MD   40 mg at 04/13/17 0840  . isosorbide mononitrate (IMDUR) 24 hr tablet 30 mg  30 mg Oral Daily Opyd, Lavone Neriimothy S, MD   30 mg at 04/13/17 1046  . levETIRAcetam (KEPPRA) tablet 250 mg  250 mg Oral BID Opyd, Lavone Neriimothy S, MD   250 mg at 04/13/17  1047  . lisinopril (PRINIVIL,ZESTRIL) tablet 2.5 mg  2.5 mg Oral Daily Opyd, Lavone Neriimothy S, MD   2.5 mg at 04/13/17 1044  . ondansetron (ZOFRAN) injection 4 mg  4 mg Intravenous Q6H PRN Opyd, Lavone Neriimothy S, MD      . rivaroxaban (XARELTO) tablet 20 mg  20 mg Oral Daily Opyd, Lavone Neriimothy S, MD   20 mg at 04/13/17 1047  . sodium chloride flush (NS) 0.9 % injection 3 mL  3 mL Intravenous Q12H Opyd, Lavone Neriimothy S, MD   3 mL at 04/13/17 0843  . sodium chloride flush (NS) 0.9 % injection 3 mL  3 mL Intravenous PRN Opyd, Lavone Neriimothy S, MD   3 mL at 04/12/17 [id number]     Discharge Medications: Please see discharge summary for a list of discharge medications.  Relevant Imaging Results:  Relevant Lab Results:   Additional Information SSN: [id number]  Elliot GaultKathleen Derreon Consalvo, LCSW

## 2017-04-13 NOTE — Progress Notes (Signed)
Report called to [facility] [place] staff member Saks IncorporatedCynthia RN.  All questions answered.

## 2017-05-06 ENCOUNTER — Encounter: Payer: Medicare Other | Admitting: *Deleted

## 2017-05-06 ENCOUNTER — Telehealth: Payer: Self-pay | Admitting: Cardiology

## 2017-05-06 NOTE — Telephone Encounter (Signed)
Confirmed remote transmission w/ pt daughter.   

## 2017-05-07 ENCOUNTER — Encounter: Payer: Self-pay | Admitting: Cardiology

## 2017-07-10 DEATH — deceased

## 2017-07-14 ENCOUNTER — Ambulatory Visit: Payer: Self-pay | Admitting: *Deleted

## 2017-10-16 ENCOUNTER — Encounter: Payer: Medicare Other | Admitting: Internal Medicine

## 2017-10-23 ENCOUNTER — Encounter: Payer: Medicare Other | Admitting: Internal Medicine

## 2018-11-07 IMAGING — RF DG SWALLOWING FUNCTION - NRPT [facility]
13 of 24 series · 13 of 24 positions shown · non-contrast
Comparison: none

[Series 1: before po · 1 of 31 frames shown]
[frame 1/31]
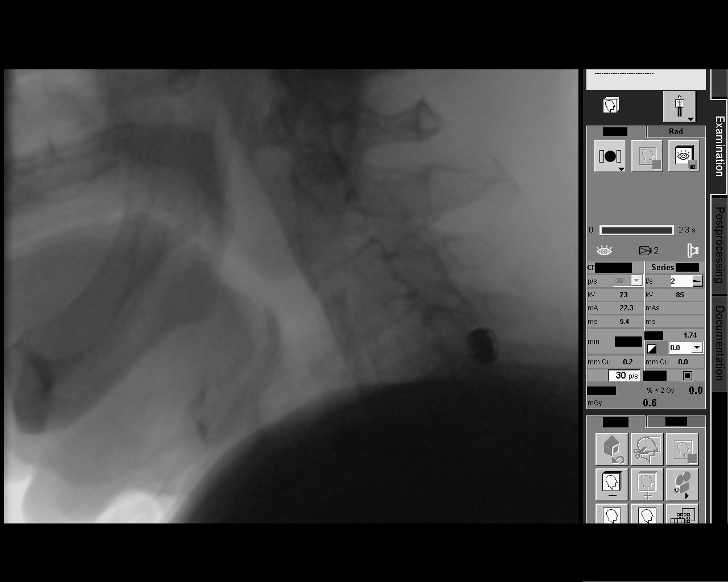

[Series 3: tsp thin · 1 of 280 frames shown]
[frame 43/280]
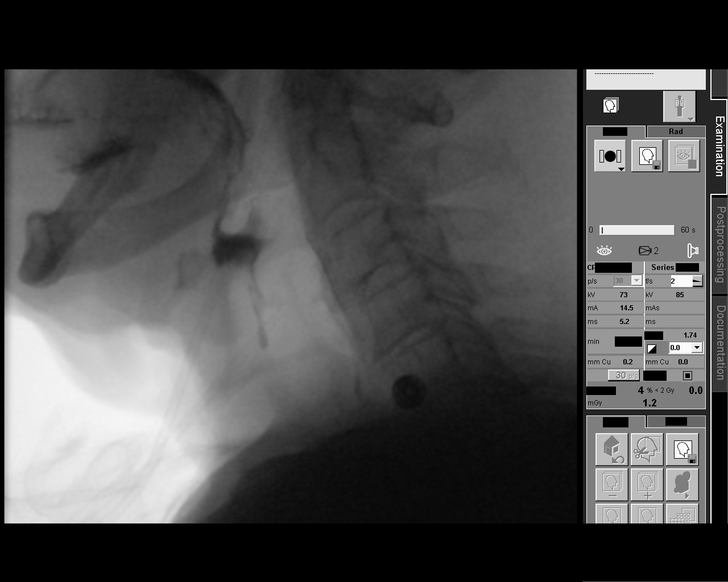

[Series 5: run · 1 of 118 frames shown]
[frame 41/118]
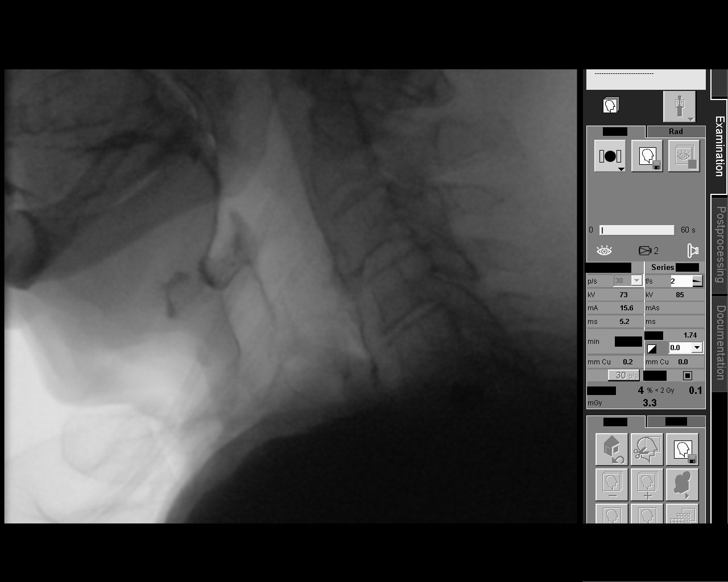

[Series 7: puree · 1 of 260 frames shown]
[frame 131/260]
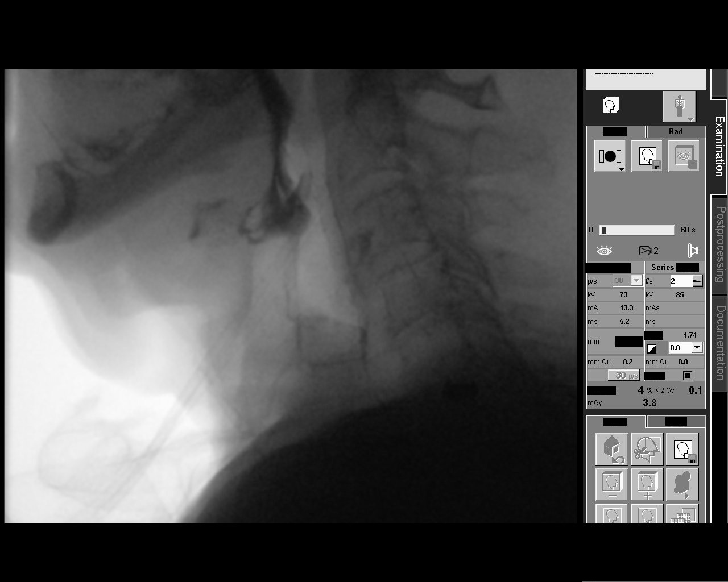

[Series 9: cued cough · 1 of 254 frames shown]
[frame 128/254]
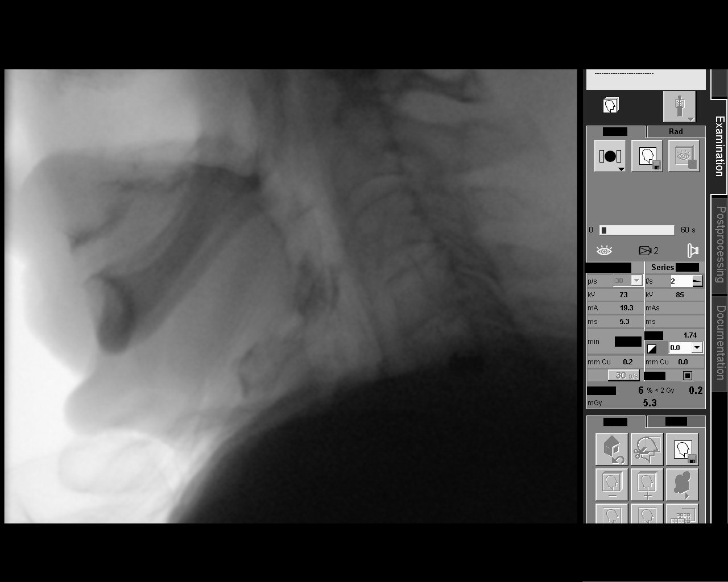

[Series 11: cracker · 1 of 1242 frames shown]
[frame 622/1242]
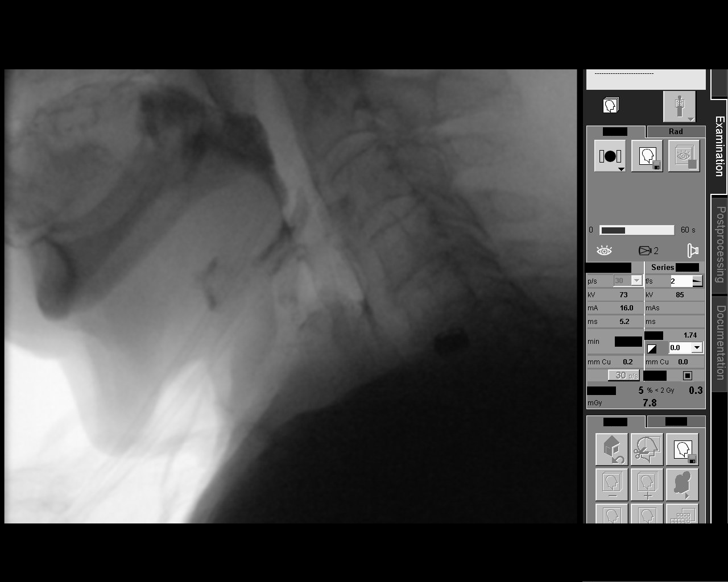

[Series 13: still cracker · 1 of 293 frames shown]
[frame 147/293]
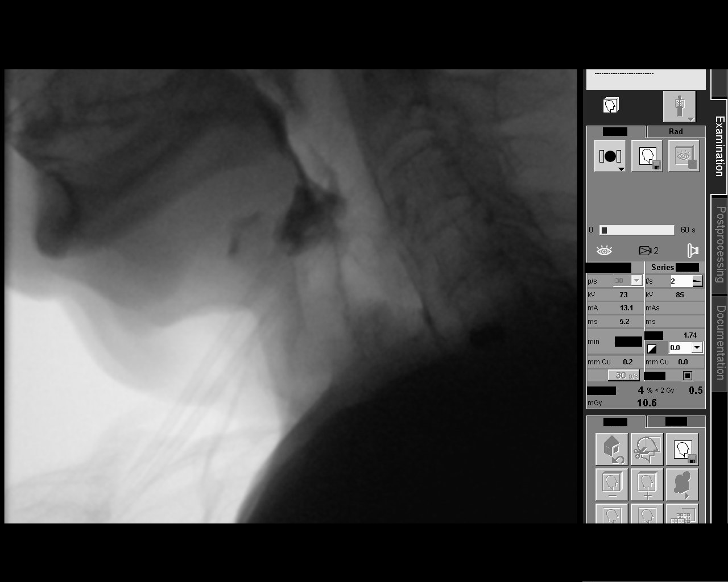

[Series 14: finallly swallowed cracker · 1 of 542 frames shown]
[frame 409/542]
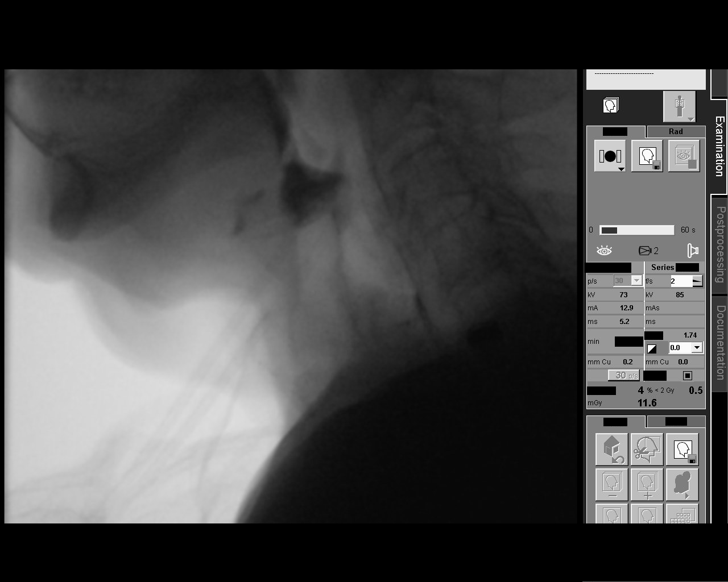

[Series 16: post straw thin · 1 of 161 frames shown]
[frame 81/161]
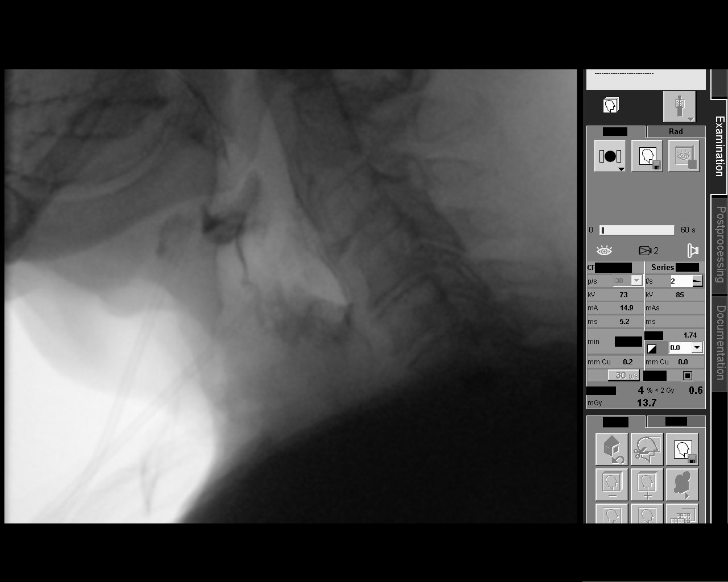

[Series 18: mech soft · 1 of 273 frames shown (1 of 2)]
[frame 137/273]
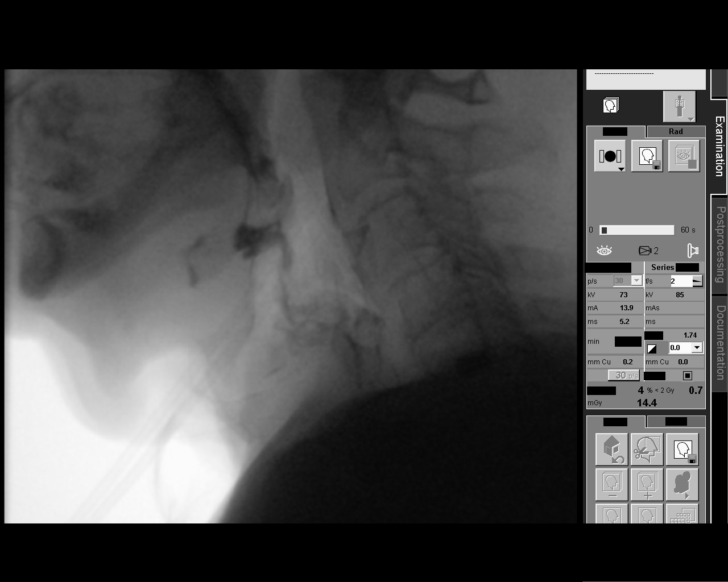

[Series 20: mech soft · 1 of 328 frames shown (2 of 2)]
[frame 165/328]
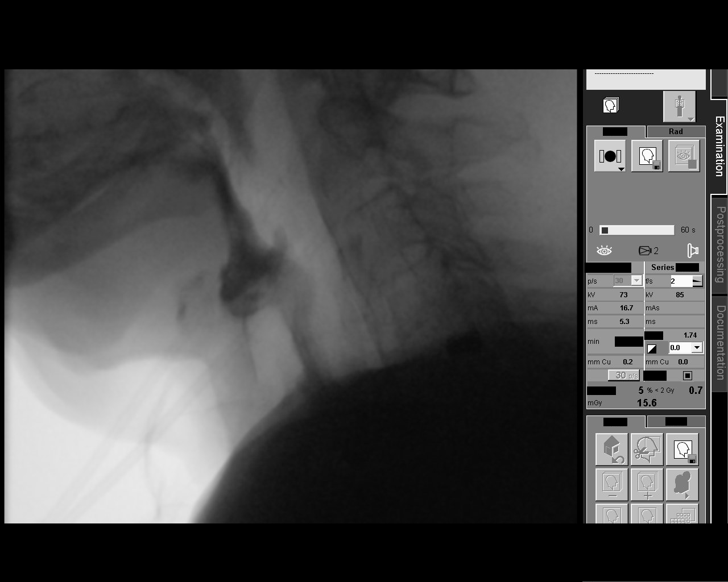

[Series 22: still m/s · 1 of 62 frames shown]
[frame 53/62]
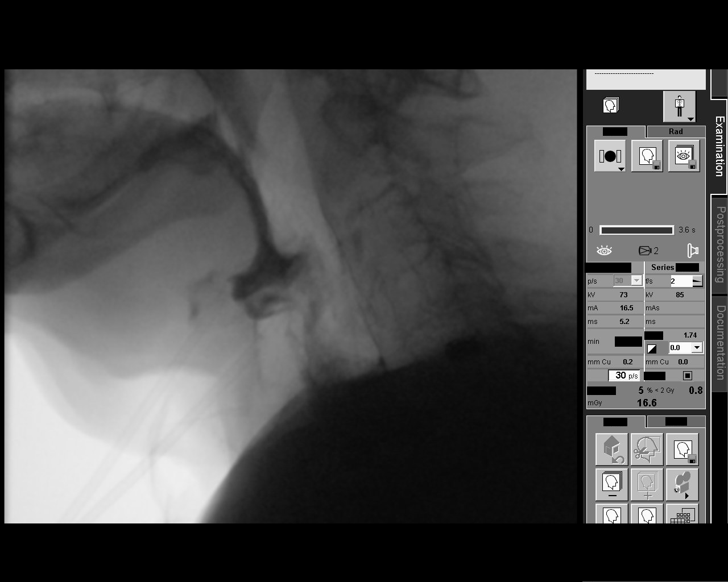

[Series 24: gave sips thin to trigger swallow · 1 of 292 frames shown]
[frame 249/292]
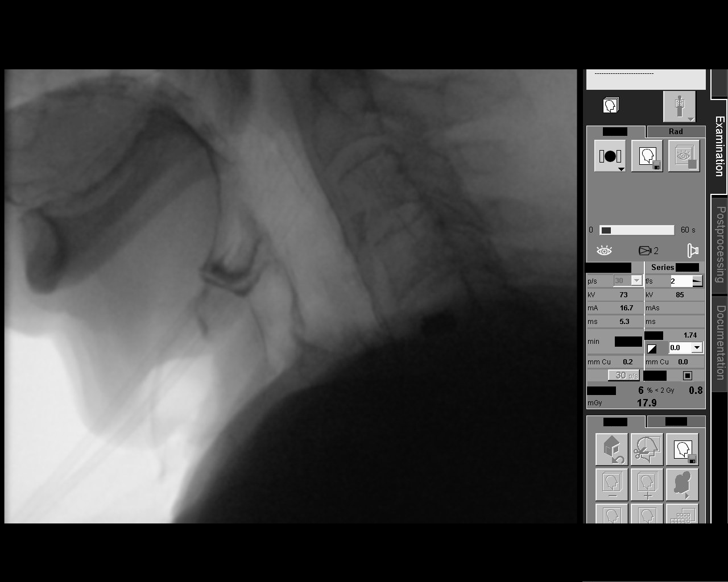

[13 of 24 positions shown; findings below may reference images not displayed]

FLUOROSCOPY FOR SWALLOWING FUNCTION STUDY:
Fluoroscopy was provided for swallowing function study, which was administered by a speech pathologist.  Final results and recommendations from this study are contained within the speech pathology report.

## 2018-11-09 IMAGING — CR DG CHEST 1V PORT
1 series · 1 of 1 positions shown · non-contrast
Comparison: 04/08/2017.

CLINICAL DATA: Altered mental status. Hypoxia today.
Cardiomyopathy.

EXAM:
PORTABLE CHEST 1 VIEW

[portable]
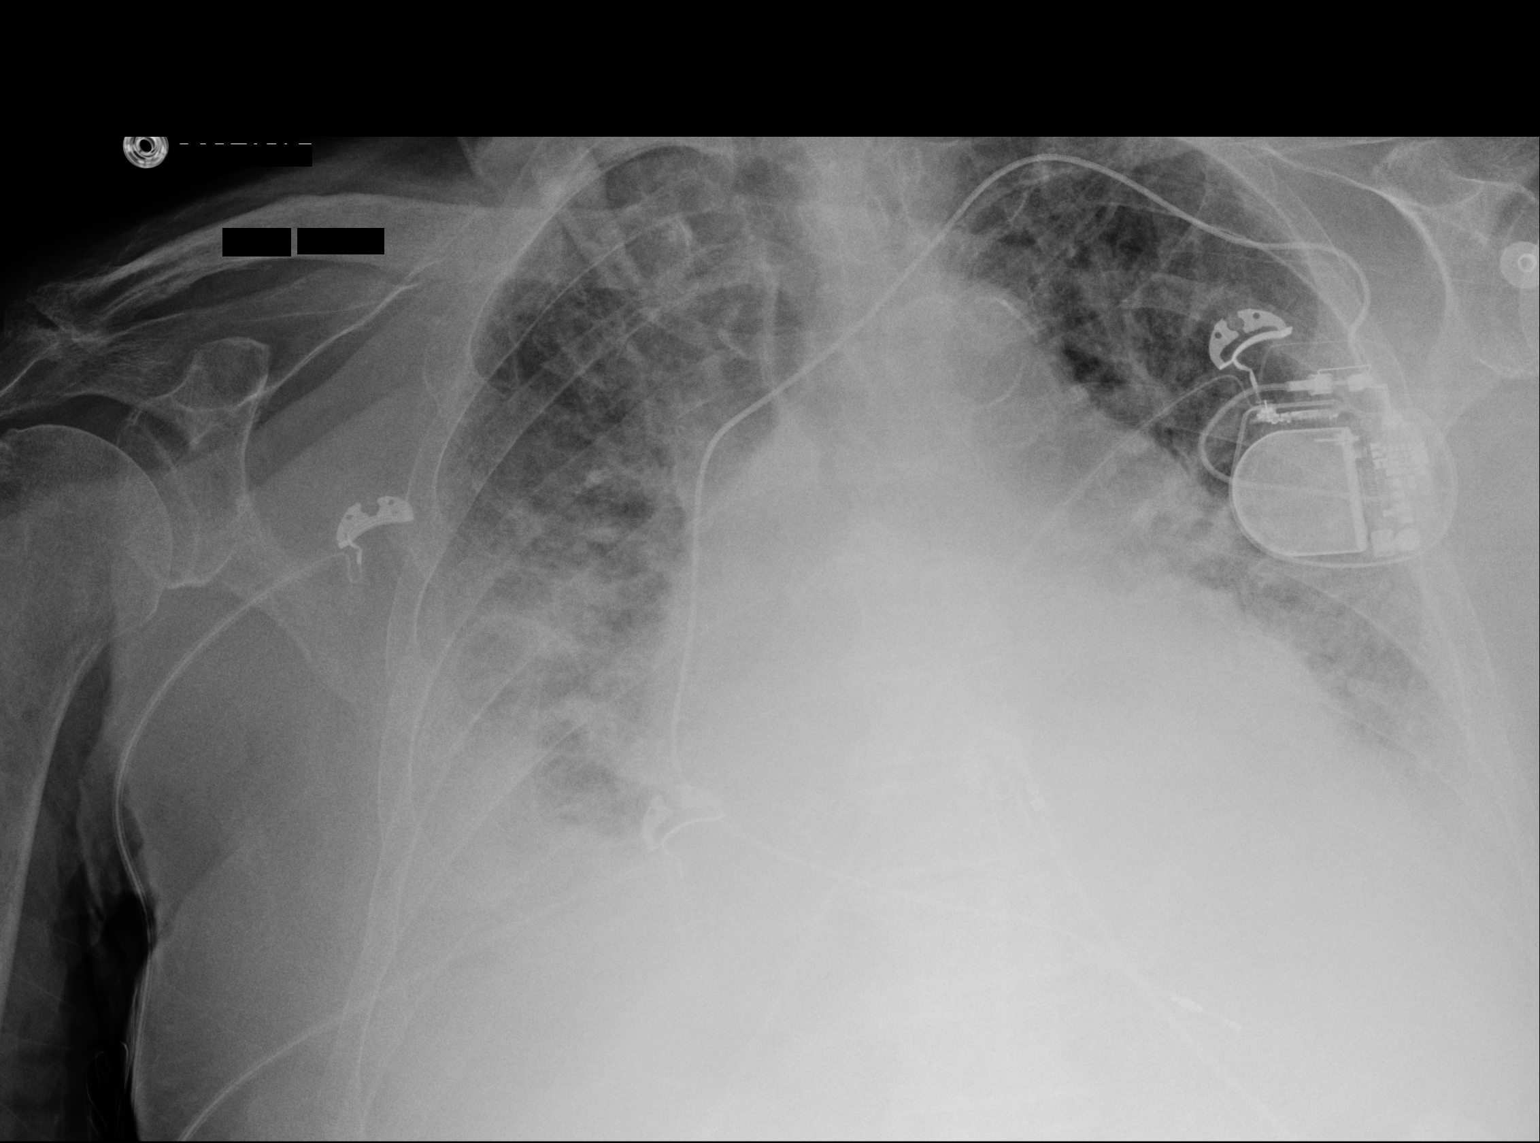

[1 of 1 positions shown; findings below may reference images not displayed]

FINDINGS: Enlarged cardiac silhouette without significant change. Mild
increase in prominence of the pulmonary vasculature and interstitial
markings. Increased patchy opacity at both lung bases with probable
bilateral pleural effusions. Stable left subclavian pacemaker lead.
Thoracic spine degenerative changes.
IMPRESSION: Worsening changes of congestive heart failure.
